# Patient Record
Sex: Male | Born: 1935 | Race: White | Hispanic: No | Smoking: Current every day smoker
Health system: Southern US, Community
[De-identification: ages and names within clinical notes are randomized; demographics above are authoritative.]

## PROBLEM LIST (undated history)

## (undated) DIAGNOSIS — S2239XA Fracture of one rib, unspecified side, initial encounter for closed fracture: Secondary | ICD-10-CM

## (undated) DIAGNOSIS — I10 Essential (primary) hypertension: Secondary | ICD-10-CM

## (undated) DIAGNOSIS — I447 Left bundle-branch block, unspecified: Secondary | ICD-10-CM

## (undated) DIAGNOSIS — I739 Peripheral vascular disease, unspecified: Secondary | ICD-10-CM

## (undated) DIAGNOSIS — S2249XA Multiple fractures of ribs, unspecified side, initial encounter for closed fracture: Secondary | ICD-10-CM

## (undated) DIAGNOSIS — M1990 Unspecified osteoarthritis, unspecified site: Secondary | ICD-10-CM

## (undated) DIAGNOSIS — Z5189 Encounter for other specified aftercare: Secondary | ICD-10-CM

## (undated) DIAGNOSIS — E785 Hyperlipidemia, unspecified: Secondary | ICD-10-CM

## (undated) DIAGNOSIS — J939 Pneumothorax, unspecified: Secondary | ICD-10-CM

## (undated) DIAGNOSIS — C801 Malignant (primary) neoplasm, unspecified: Secondary | ICD-10-CM

## (undated) DIAGNOSIS — D649 Anemia, unspecified: Secondary | ICD-10-CM

## (undated) DIAGNOSIS — K219 Gastro-esophageal reflux disease without esophagitis: Secondary | ICD-10-CM

## (undated) DIAGNOSIS — T148XXA Other injury of unspecified body region, initial encounter: Secondary | ICD-10-CM

## (undated) HISTORY — PX: APPENDECTOMY: SHX54

## (undated) HISTORY — PX: KNEE ARTHROSCOPY: SUR90

## (undated) HISTORY — PX: HERNIA REPAIR: SHX51

## (undated) HISTORY — PX: EYE SURGERY: SHX253

## (undated) HISTORY — PX: CHOLECYSTECTOMY: SHX55

## (undated) HISTORY — PX: HEMORRHOID SURGERY: SHX153

## (undated) HISTORY — PX: OTHER SURGICAL HISTORY: SHX169

---

## 2006-03-27 ENCOUNTER — Ambulatory Visit (HOSPITAL_COMMUNITY): Admission: RE | Admit: 2006-03-27 | Discharge: 2006-03-27 | Payer: Self-pay | Admitting: Internal Medicine

## 2007-02-18 ENCOUNTER — Ambulatory Visit: Payer: Self-pay | Admitting: Internal Medicine

## 2007-03-05 ENCOUNTER — Ambulatory Visit: Payer: Self-pay | Admitting: Internal Medicine

## 2008-02-10 ENCOUNTER — Ambulatory Visit: Payer: Self-pay | Admitting: Internal Medicine

## 2008-02-24 ENCOUNTER — Ambulatory Visit: Payer: Self-pay | Admitting: Internal Medicine

## 2008-02-24 ENCOUNTER — Encounter: Payer: Self-pay | Admitting: Internal Medicine

## 2008-04-26 DIAGNOSIS — K5730 Diverticulosis of large intestine without perforation or abscess without bleeding: Secondary | ICD-10-CM | POA: Insufficient documentation

## 2008-04-26 DIAGNOSIS — K219 Gastro-esophageal reflux disease without esophagitis: Secondary | ICD-10-CM | POA: Insufficient documentation

## 2008-04-26 DIAGNOSIS — M1990 Unspecified osteoarthritis, unspecified site: Secondary | ICD-10-CM | POA: Insufficient documentation

## 2008-04-26 DIAGNOSIS — E785 Hyperlipidemia, unspecified: Secondary | ICD-10-CM

## 2008-04-26 DIAGNOSIS — K319 Disease of stomach and duodenum, unspecified: Secondary | ICD-10-CM | POA: Insufficient documentation

## 2008-04-26 DIAGNOSIS — E119 Type 2 diabetes mellitus without complications: Secondary | ICD-10-CM

## 2008-04-28 ENCOUNTER — Ambulatory Visit: Payer: Self-pay | Admitting: Internal Medicine

## 2009-04-05 ENCOUNTER — Encounter: Admission: RE | Admit: 2009-04-05 | Discharge: 2009-04-05 | Payer: Self-pay | Admitting: Internal Medicine

## 2009-08-02 ENCOUNTER — Ambulatory Visit (HOSPITAL_COMMUNITY): Admission: RE | Admit: 2009-08-02 | Discharge: 2009-08-02 | Payer: Self-pay | Admitting: Internal Medicine

## 2009-08-08 ENCOUNTER — Ambulatory Visit: Payer: Self-pay | Admitting: Surgery

## 2010-03-08 ENCOUNTER — Encounter: Payer: Self-pay | Admitting: Internal Medicine

## 2010-03-17 ENCOUNTER — Encounter: Payer: Self-pay | Admitting: Internal Medicine

## 2010-03-24 ENCOUNTER — Ambulatory Visit: Payer: Self-pay | Admitting: Internal Medicine

## 2010-03-24 ENCOUNTER — Encounter: Payer: Self-pay | Admitting: Internal Medicine

## 2010-03-24 DIAGNOSIS — R55 Syncope and collapse: Secondary | ICD-10-CM

## 2010-03-27 ENCOUNTER — Ambulatory Visit: Payer: Self-pay | Admitting: Surgery

## 2010-04-13 ENCOUNTER — Telehealth (INDEPENDENT_AMBULATORY_CARE_PROVIDER_SITE_OTHER): Payer: Self-pay

## 2010-04-17 ENCOUNTER — Ambulatory Visit: Payer: Self-pay | Admitting: Cardiovascular Disease

## 2010-04-17 ENCOUNTER — Ambulatory Visit: Payer: Self-pay | Admitting: Internal Medicine

## 2010-04-17 ENCOUNTER — Encounter: Payer: Self-pay | Admitting: Cardiovascular Disease

## 2010-04-17 ENCOUNTER — Encounter (HOSPITAL_COMMUNITY): Admission: RE | Admit: 2010-04-17 | Discharge: 2010-06-06 | Payer: Self-pay | Admitting: Internal Medicine

## 2010-04-17 ENCOUNTER — Ambulatory Visit (HOSPITAL_COMMUNITY): Admission: RE | Admit: 2010-04-17 | Discharge: 2010-04-17 | Payer: Self-pay | Admitting: Internal Medicine

## 2010-04-17 ENCOUNTER — Encounter: Payer: Self-pay | Admitting: Internal Medicine

## 2010-04-17 ENCOUNTER — Ambulatory Visit: Payer: Self-pay

## 2010-12-05 NOTE — Progress Notes (Signed)
Summary: Nuc. Pre-Procedure  Phone Note Outgoing Call   Call placed by: Irean Hong, RN,  April 13, 2010 10:42 AM Summary of Call: Reviewed information on Myoview Information Sheet (see scanned document for further details).  Spoke with patient.     Nuclear Med Background Indications for Stress Test: Evaluation for Ischemia, Abnormal EKG   History: COPD   Symptoms: Syncope    Nuclear Pre-Procedure Cardiac Risk Factors: Carotid Disease, LBBB, NIDDM, PVD Height (in): 73

## 2010-12-05 NOTE — Letter (Signed)
Summary: [facility] Annual Physical Note  [facility] Annual Physical Note   Imported By: Roderic Ovens 03/30/2010 15:37:43  _____________________________________________________________________  External Attachment:    Type:   Image     Comment:   External Document

## 2010-12-05 NOTE — Assessment & Plan Note (Signed)
Summary: syncope/jr  Medications Added CRESTOR 20 MG TABS (ROSUVASTATIN CALCIUM) take 1/2 tablet at bedtime LISINOPRIL-HYDROCHLOROTHIAZIDE 20-12.5 MG TABS (LISINOPRIL-HYDROCHLOROTHIAZIDE) take one tablet once daily LOVAZA 1 GM CAPS (OMEGA-3-ACID ETHYL ESTERS) take one capsule once daily GLUCOTROL 5 MG TABS (GLIPIZIDE) two times a day METFORMIN HCL 1000 MG TABS (METFORMIN HCL) take one tablet two times a day VOLTAREN 0.1 % SOLN (DICLOFENAC SODIUM) use as needed      Allergies Added: NKDA  Primary Provider:  Martha Clan, MD  CC:  syncope/abnormal EKG.  Pt states that he has not been getting much sleep.  He slept 2 hours last night and thinks that might have a little to do with his problem.  History of Present Illness: Patient is a 77 year old who was referred for evaluation of syncope. Early this morning he woke up a  1 AM.  Went back to bed.  At 2 AM had coffee.  Went back to bed.  Dozed.  Awake at 4AM  At 5 AM sat up in bed.  Blacked out.  Hit table.   He denies dizziness, no chest pain.  No shortness of breath. he has not been resting well since an accident about 7 wks ago when he fell down a flight of stairs and punctured lung. He was seen by Dr. Clelia Croft earlier today and was referred her for evaluation. He is set up for a carotid USN as he has known CV disease.  Current Medications (verified): 1)  Baby Aspirin 81 Mg  Chew (Aspirin) .... Once Daily 2)  Glucosamine-Chondroitin 250-200 Mg  Caps (Glucosamine-Chondroitin) .... Once Daily 3)  Crestor 20 Mg Tabs (Rosuvastatin Calcium) .... Take 1/2 Tablet At Bedtime 4)  Tricor 145 Mg  Tabs (Fenofibrate) .... Once Daily 5)  Lisinopril-Hydrochlorothiazide 20-12.5 Mg Tabs (Lisinopril-Hydrochlorothiazide) .... Take One Tablet Once Daily 6)  Lovaza 1 Gm Caps (Omega-3-Acid Ethyl Esters) .... Take One Capsule Once Daily 7)  Glucotrol 5 Mg Tabs (Glipizide) .... Two Times A Day 8)  Metformin Hcl 1000 Mg Tabs (Metformin Hcl) .... Take One Tablet  Two Times A Day 9)  Voltaren 0.1 % Soln (Diclofenac Sodium) .... Use As Needed  Allergies (verified): No Known Drug Allergies  Past History:  Past Surgical History: Last updated: 04/26/2008 cholecystectomy hernia repair hemorrhoidectomy appendectomy  Family History: Last updated: 04/28/2008 No FH of Colon Cancer: Family History of Ovarian Cancer:Sister Family History of Liver Disease/Cirrhosis:Father  Past Medical History: Current Problems:  DIVERTICULOSIS, COLON (ICD-562.10) PEPTIC STRICTURE (ICD-537.89)status post dilation GERD (ICD-530.81) OSTEOARTHRITIS (ICD-715.90) HYPERLIPIDEMIA (ICD-272.4) DIABETES MELLITUS, TYPE II (ICD-250.00) CV disease. LBBB. PVOD  ABI 0.73 R; 0.61 L (08/2009) COPD.  Social History: Occupation: Retired Patient currently smokes. cigars  Quit cigs in 1991 after 80 pyr. Alcohol Use - no Daily Caffeine Use- Coffee3-4 cups Illicit Drug Use - no Patient gets regular exercise.  Review of Systems       All systems reviewed.  Negative to above probllem except as noted above.  Vital Signs:  Patient profile:   77 year old male Height:      73 inches Weight:      230 pounds BMI:     30.45 Pulse rate:   73 / minute Pulse (ortho):   88 / minute Pulse rhythm:   regular BP sitting:   154 / 64  (left arm) BP standing:   137 / 68 Cuff size:   large  Vitals Entered By: Burnett Kanaris, CNA (Mar 24, 2010 10:37 AM)  Serial Vital Signs/Assessments:  Time      Position  BP       Pulse  Resp  Temp     By 10:47 AM  Lying LA  136/68   70                    Burnett Kanaris, CNA 10:47 AM  Sitting   132/66   75                    Burnett Kanaris, CNA 10:47 AM  Standing  137/68   88                    Burnett Kanaris, CNA  Comments: 10:47 AM 3 minutes- 145/67 HR 75  Pt tired and in pain.  Pt states he did not get much sleep last night.   By: Burnett Kanaris, CNA    Physical Exam  Additional Exam:  Pateint is in NAD. HEENT:  Normocephalic,  atraumatic. EOMI, PERRLA.  Neck: JVP is normal. No thyromegaly. Positive bruit L. Lungs: Crackles at R base.  Otherwise CTA Heart: Regular rate and rhythm. Normal S1, S2. No S3.   No significant murmurs. PMI not displaced.  Abdomen:  Supple, nontender. Normal bowel sounds. No masses. No hepatomegaly.  Extremities:  Tr edema. Musculoskeletal :moving all extremities.  Neuro:   alert and oriented x 3.    EKG  Procedure date:  03/24/2010  Findings:      Sinus rhythm.  74 bpm. First degree AV block.  LBBB.  Impression & Recommendations:  Problem # 1:  SYNCOPE (ICD-780.2) Patient with known vascluar disease.  Event is concerning because of lack of prodrome. I would recommend:  echo, adenosine myoview, holter monitor.   He is due to have carotid dopplers. I have instructed him that he should not drive for 3 to 6 months unless  a reversible cause is found.  Problem # 2:  HYPERLIPIDEMIA (ICD-272.4) Continue meds. His updated medication list for this problem includes:    Crestor 20 Mg Tabs (Rosuvastatin calcium) .Marland Kitchen... Take 1/2 tablet at bedtime    Tricor 145 Mg Tabs (Fenofibrate) ..... Once daily    Lovaza 1 Gm Caps (Omega-3-acid ethyl esters) .Marland Kitchen... Take one capsule once daily  Problem # 3:  DIABETES MELLITUS, TYPE II (ICD-250.00) Followed by Dr. Clelia Croft.  Other Orders: T-2 View CXR (71020TC) Echocardiogram (Echo) Nuclear Stress Test (Nuc Stress Test) Holter (Holter)  Patient Instructions: 1)  Your physician has requested that you have an adenosine myoview.  For further information please visit [email or web address]  Please follow instruction sheet, as given. 2)  Your physician has requested that you have an echocardiogram.  Echocardiography is a painless test that uses sound waves to create images of your heart. It provides your doctor with information about the size and shape of your heart and how well your heart's chambers and valves are working.  This procedure takes approximately  one hour. There are no restrictions for this procedure. 3)  Your physician has recommended that you wear a holter monitor.  Holter monitors are medical devices that record the heart's electrical activity. Doctors most often use these monitors to diagnose arrhythmias. Arrhythmias are problems with the speed or rhythm of the heartbeat. The monitor is a small, portable device. You can wear one while you do your normal daily activities. This is usually used to diagnose what is causing palpitations/syncope (passing out).

## 2010-12-05 NOTE — Assessment & Plan Note (Signed)
Summary: Cardiology Nuclear Study  Nuclear Med Background Indications for Stress Test: Evaluation for Ischemia, Abnormal EKG   History: COPD   Symptoms: DOE, Palpitations, SOB, Syncope    Nuclear Pre-Procedure Cardiac Risk Factors: Carotid Disease, LBBB, NIDDM, PVD Caffeine/Decaff Intake: None NPO After: 11:00 PM Lungs: clear IV 0.9% NS with Angio Cath: 22g     IV Site: (R) Forearm IV Started by: Stanton Kidney EMT-P Chest Size (in) 48     Height (in): 73 Weight (lb): 229 BMI: 30.32  Nuclear Med Study 1 or 2 day study:  1 day     Stress Test Type:  Adenosine Reading MD:  Charlton Haws, MD     Referring MD:  P.Ross Resting Radionuclide:  Technetium 19m Tetrofosmin     Resting Radionuclide Dose:  10 mCi  Stress Radionuclide:  Technetium 68m Tetrofosmin     Stress Radionuclide Dose:  33 mCi   Stress Protocol  Dose of Adenosine:  58.3 mg    Stress Test Technologist:  Milana Na EMT-P     Nuclear Technologist:  Domenic Polite CNMT  Rest Procedure  Myocardial perfusion imaging was performed at rest 45 minutes following the intravenous administration of Myoview Technetium 43m Tetrofosmin.  Stress Procedure  The patient received IV adenosine at 140 mcg/kg/min for 4 minutes. There were no significant changes and occ pvcs/pac with infusion. Myoview was injected at the 2 minute mark and quantitative spect images were obtained after a 45 minute delay.  QPS Raw Data Images:  Normal; no motion artifact; normal heart/lung ratio. Stress Images:  NI: Uniform and normal uptake of tracer in all myocardial segments. Rest Images:  Normal homogeneous uptake in all areas of the myocardium. Subtraction (SDS):  Normal Transient Ischemic Dilatation:  .94  (Normal <1.22)  Lung/Heart Ratio:  .11  (Normal <0.45)  Quantitative Gated Spect Images QGS EDV:  153 ml QGS ESV:  64 ml QGS EF:  58 % QGS cine images:  Abnromal septal motion  Findings Low risk nuclear study      Overall  Impression  Exercise Capacity: Lexiscan BP Response: Normal blood pressure response. Clinical Symptoms: Light headed ECG Impression: LBBB Overall Impression: Septal thinnng consistant with BBB  no ischemia  Appended Document: Cardiology Nuclear Study No ischemia on myoview to explain syncope.  Normal myoview scan.  Appended Document: Cardiology Nuclear Study pt aware of results

## 2010-12-05 NOTE — Procedures (Signed)
Summary: Summary Report  Summary Report   Imported By: Erle Crocker 05/04/2010 11:15:20  _____________________________________________________________________  External Attachment:    Type:   Image     Comment:   External Document

## 2011-03-20 NOTE — Procedures (Signed)
CAROTID DUPLEX EXAM   INDICATION:  Follow up carotid artery disease.   HISTORY:  Diabetes:  Yes.  Cardiac:  Yes.  Hypertension:  Yes.  Smoking:  Cigars.  Previous Surgery:  No.  CV History:  Episode of syncope recently.  Asymptomatic now.  Amaurosis Fugax No, Paresthesias No, Hemiparesis No.                                       RIGHT               LEFT  Brachial systolic pressure:         128                 136  Brachial Doppler waveforms:         WNL                 WNL  Vertebral direction of flow:        Antegrade           Antegrade  DUPLEX VELOCITIES (cm/sec)  CCA peak systolic                   90                  125  ECA peak systolic                   221                 214  ICA peak systolic                   184                 141  ICA end diastolic                   44                  35  PLAQUE MORPHOLOGY:                  Calcific            Calcific  PLAQUE AMOUNT:                      Moderate            Moderate  PLAQUE LOCATION:                    ICA/ECA/bifurcation  ICA/ECA/bifurcation   IMPRESSION:  1. Right internal carotid artery shows evidence of 40% to 59% stenosis      (high end of range).  2. Left internal carotid artery shows evidence of 40% to 59% stenosis.  3. Bilateral external carotid artery stenosis.  4. No significant changes from previous study done on 08/08/2009.   ___________________________________________  V. Charlena Cross, MD   AS/MEDQ  D:  03/27/2010  T:  03/27/2010  Job:  [id number]

## 2011-03-20 NOTE — Assessment & Plan Note (Signed)
[facility]                         GASTROENTEROLOGY OFFICE NOTE   Shane Pope, Shane Pope                         MRN:          [id number]  DATE:02/10/2008                            DOB:          [birth year]    REFERRING PHYSICIAN:  Kari Baars, M.D.   REASON FOR CONSULTATION:  Dysphagia.   PHYSICIAN REQUESTING CONSULTATION:  Dr. Kari Baars   HISTORY:  This is a 77 year old white male with a history of  hyperlipidemia, type 2 diabetes, and osteoarthritis.  He is referred  through the courtesy of Dr. Clelia Croft regarding dysphagia.  The patient  reports to me intermittent solid food dysphagia to items such as rice.  This has occasionally been severe, requiring regurgitation for relief.  He has had 3 significant episodes since December; the most recent  episode 2 weeks ago.  No problems with liquids.  The patient does report  a several-year history of indigestion and heartburn.  No melena,  hematochezia, change in appetite or weight loss.  No abdominal pain.  Bowel habits have been regular.  The patient denies a prior history of  GI problems or GI evaluations.  He uses Pepcid p.r.n. for his heartburn.   PAST MEDICAL HISTORY:  1. Type 2 diabetes.  2. Hyperlipidemia.  3. Osteoarthritis.   PAST SURGICAL HISTORY:  1. Cholecystectomy.  2. Hernia repair.  3. Hemorrhoidectomy.  4. Appendectomy.   ALLERGIES:  NO KNOWN DRUG ALLERGIES.   CURRENT MEDICATIONS:  Crestor, Tricor, Janumet, benazepril, fish oil,  glucosamine, baby aspirin, Celebrex, Pepcid p.r.n.   FAMILY HISTORY:  Father with cirrhosis.  No gastrointestinal malignancy.   SOCIAL HISTORY:  The patient is divorced, with 2 daughters.  Lives  alone.  Has a college degree.  Worked previously in the 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  Smokes cigars.  Does not use  alcohol.   REVIEW OF SYSTEMS:  Entirely negative as documented on the diagnostic  evaluation form.   PHYSICAL  EXAMINATION:  A well-appearing male in no acute distress.  He  is alert and oriented x3.  Blood pressure is 152/66, heart rate is 76 and regular, respirations are  18.  He is 234.8. pounds.  He is 6 feet in height.  HEENT:  Sclerae are anicteric.  Conjunctivae are pink.  Oral mucosa is  intact.  There is no adenopathy.  LUNGS:  Clear.  HEART:  Regular.  ABDOMEN:  Soft without tenderness, mass, or hernia.  Good bowel sounds  heard.  EXTREMITIES:  Without edema.   IMPRESSION:  1. Gastroesophageal reflux disease.  2. Intermittent solid food dysphagia likely due to peptic stricture.  3. Colon cancer screening, baseline risk.   RECOMMENDATIONS:  1. Upper endoscopy with possible esophageal dilation.  The nature of      the procedure, as well as the risks, benefits and alternatives,      have been reviewed.  He understood and agreed to proceed.  2. Consider daily proton pump inhibitor therapy.  3. Information on reflux and reflux precautions.  4. Consider screening colonoscopy after upper GI issues resolve.  5. Ongoing general  medical care with Dr. Clelia Croft.     Wilhemina Bonito. Marina Goodell, MD  Electronically Signed    JNP/MedQ  DD: 02/10/2008  DT: 02/10/2008  Job #: [id number]   cc:   Kari Baars, M.D.

## 2011-03-20 NOTE — Procedures (Signed)
CAROTID DUPLEX EXAM   INDICATION:  Follow up carotid artery disease/PVD.   HISTORY:  Diabetes:  Yes.  Cardiac:  Yes.  Hypertension:  Yes.  Smoking:  Cigars.  Previous Surgery:  No.  CV History:  Asymptomatic.  Amaurosis Fugax No, Paresthesias No, Hemiparesis No.                                       RIGHT               LEFT  Brachial systolic pressure:         136                 145  Brachial Doppler waveforms:         WNL                 WNL  Vertebral direction of flow:        Antegrade           Antegrade  DUPLEX VELOCITIES (cm/sec)  CCA peak systolic                   95                  98  ECA peak systolic                   279                 203  ICA peak systolic                   192                 146  ICA end diastolic                   56                  36  PLAQUE MORPHOLOGY:                  Calcific            Calcific  PLAQUE AMOUNT:                      Mild/moderate       Mild/moderate  PLAQUE LOCATION:                    Bifurcation/ICA/ECA  Bifurcation/ICA/ECA   IMPRESSION:  1. Bilateral internal carotid arteries show evidence of 40-59%      stenosis with the right at the top-end of range.  2. Bilateral external carotid artery stenosis.   ___________________________________________  Janetta Hora Fields, MD   AS/MEDQ  D:  08/08/2009  T:  08/09/2009  Job:  [id number]

## 2011-12-25 ENCOUNTER — Other Ambulatory Visit: Payer: Self-pay | Admitting: Orthopedic Surgery

## 2012-03-03 NOTE — H&P (Signed)
Shane Pope DOB: 11-Jul-1931  Chief Complaint: right knee pain  History of Present Illness The patient is a 78 year old male who comes in today for a preoperative History and Physical. The patient is scheduled for a right total knee arthroplasty to be performed by Dr. Gus Rankin. [name], MD at [facility] on Monday Mar 24, 2012 . The patienthas been treated in the past by Dr. Thomasena Edis and Dr. Darrelyn Hillock for bilateral knee pain. The patient reports left knee and right knee symptoms including pain and grinding which began over 10 years ago without any known injury.The patient feels that the symptoms are worse in the right knee. [name] states the knee is getting progressively worse over time. The pain is tolerable but his dysfunction is getting worse and worse. He is having a hard time getting around. The right knee is worse than the left. He is unable to do things he desires. He is very concerned about the knee giving out on him as it has given out a few times. He has had cortisone and visco supplement injections which helped him a very minimal amount of time. Due to failure of conservative measures, the most predictable means for decreased pain and increased function in the right knee is a right total knee arthroplasty. Risks and benefits of the surgery discussed. PCP: Dr. Clelia Croft   Past MedicalHistory Fx closed rib NOS (807.00).  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 Osteoarthrosis NOS, lower leg (715.96).  Osteoarthrosis, local, primary, lower leg (715.16).  Hypertension Diabetes Mellitus, Type II Hypercholesterolemia Gastroesophageal Reflux Disease COPD. very early Fracture Of Rib Fracture Of Wrist  Allergies No Known Drug Allergies.    Social History Marital status. divorced Pain Contract. no Tobacco use. Smokes cigars. quit smoking cigarettes several years ago Current work status. retired Exercise. Exercises rarely Alcohol use. Never consumed  alcohol. Post-Surgical Plans. SNF: [facility]. living will, healthcare POA Living situation. Lives alone. Children. 2   Medication History Atorvastatin Calcium (40MG  Tablet, Oral) Active. Fenofibrate (160MG  Tablet, Oral daily) Active. GlipiZIDE (10MG  Tablet, 1/2 tablet Oral two times daily) Active. Lisinopril-Hydrochlorothiazide (20-25MG  Tablet, Oral daily) Active. Lovaza (1GM Capsule, Oral two times daily) Active. MetFORMIN HCl (1000MG  Tablet, Oral two times daily) Active. Victoza (18MG /3ML Solution, Subcutaneous daily) Active. (has not been taking for the last month) Aspirin EC (81MG  Tablet DR, Oral daily) Active. Omeprazole (40MG  Capsule DR, Oral daily) Active.   Past Surgical History Hemorrhoidectomy Cholecystectomy Tonsillectomy Appendectomy Arthroscopic Knee Surgery - Right    Review of Systems General:Not Present- Chills, Fever, Night Sweats, Fatigue, Weight Gain, Weight Loss and Memory Loss. Skin:Not Present- Hives, Itching, Rash, Eczema and Lesions. HEENT:Not Present- Tinnitus, Headache, Double Vision, Visual Loss, Hearing Loss and Dentures. Respiratory:Not Present- Shortness of breath with exertion, Shortness of breath at rest, Allergies, Coughing up blood and Chronic Cough. Cardiovascular:Not Present- Chest Pain, Racing/skipping heartbeats, Difficulty Breathing Lying Down, Murmur, Swelling and Palpitations. Gastrointestinal:Not Present- Bloody Stool, Heartburn, Abdominal Pain, Vomiting, Nausea, Constipation, Diarrhea, Difficulty Swallowing, Jaundice and Loss of appetitie. Male Genitourinary:Not Present- Urinary frequency, Blood in Urine, Weak urinary stream, Discharge, Flank Pain, Incontinence, Painful Urination, Urgency, Urinary Retention and Urinating at Night. Musculoskeletal:Present- Joint Swelling, Joint Pain and Morning Stiffness. Not Present- Muscle Weakness, Muscle Pain, Back Pain and Spasms. Neurological:Not Present-  Tremor, Dizziness, Blackout spells, Paralysis, Difficulty with balance and Weakness. Psychiatric:Not Present- Insomnia.   Vitals Weight: 216 lb Height: 72.5 in Body Surface Area: 2.24 m Body Mass Index: 28.89 kg/m Pulse: 68 (Regular) Resp.: 18 (Unlabored)  BP: 148/75 (Sitting, Left Arm, Standard)    Physical Exam General Mental Status - Alert, cooperative and good historian. General Appearance- pleasant. Not in acute distress. Orientation- Oriented X3. Build & Nutrition- Overweight, Well nourished and Well developed. Head and Neck Head- normocephalic, atraumatic . Neck Global Assessment- supple. no bruit auscultated on the right and no bruit auscultated on the left. Eye Pupil- Bilateral- Normal. Motion- Bilateral- EOMI. Chest and Lung Exam Auscultation: Adventitious sounds:Expiratory wheeze- Right Lung Field. Note: Lungs clear to auscultation in the left lung field Cardiovascular Auscultation:Rhythm- Regular rate and rhythm. Heart Sounds- S1 WNL and S2 WNL. Murmurs & Other Heart Sounds:Auscultation of the heart reveals - No Murmurs. Abdomen Palpation/Percussion:Tenderness- Abdomen is non-tender to palpation. Rigidity (guarding)- Abdomen is soft. Auscultation:Auscultation of the abdomen reveals - Bowel sounds normal. Male Genitourinary Not done, not pertinent to present illness Peripheral Vascular Upper Extremity: Palpation:- Pulses bilaterally normal. Lower Extremity: Palpation:- Pulses bilaterally normal. Neurologic Examination of related systems reveals - normal muscle strength and tone in all extremities. Neurologic evaluation reveals - normal sensation and upper and lower extremity deep tendon reflexes intact bilaterally . Musculoskeletal Both hips show a normal range of motion with no discomfort. The hips show a normal range of motion with no discomfort. The left knee with no effusion, range is 5-125 with a slight  varus and moderate crepitus on range of motion. Tender medial greater than lateral. No instability. The right knee has about a 10 degree varus deformity. Range is 10-120 with marked crepitus on range of motion. Slightly tender medial greater than lateral. No instability is noted.   RADIOGRAPHS: AP of both knees and lateral show he has bone on bone arthritis of the medial and patellofemoral compartments of the right knee with varus deformity and some bony erosion of the proximal medial tibia. The left knee has significant narrowing but not bone on bone.  Assessment & Plan Osteoarthritis, knee Right total knee arthroplasty    Dimitri Ped, PA-C

## 2012-03-07 ENCOUNTER — Encounter (HOSPITAL_COMMUNITY): Payer: Self-pay | Admitting: Pharmacy Technician

## 2012-03-13 ENCOUNTER — Encounter (HOSPITAL_COMMUNITY)
Admission: RE | Admit: 2012-03-13 | Discharge: 2012-03-13 | Disposition: A | Discharge status: Home / Self Care | Payer: Medicare Other | Source: Ambulatory Visit | Attending: Orthopedic Surgery | Admitting: Orthopedic Surgery

## 2012-03-13 ENCOUNTER — Ambulatory Visit (HOSPITAL_COMMUNITY)
Admission: RE | Admit: 2012-03-13 | Discharge: 2012-03-13 | Disposition: A | Discharge status: Home / Self Care | Payer: Medicare Other | Source: Ambulatory Visit | Attending: Orthopedic Surgery | Admitting: Orthopedic Surgery

## 2012-03-13 ENCOUNTER — Encounter (HOSPITAL_COMMUNITY): Payer: Self-pay

## 2012-03-13 DIAGNOSIS — J8410 Pulmonary fibrosis, unspecified: Secondary | ICD-10-CM | POA: Insufficient documentation

## 2012-03-13 DIAGNOSIS — I517 Cardiomegaly: Secondary | ICD-10-CM | POA: Insufficient documentation

## 2012-03-13 DIAGNOSIS — Z01812 Encounter for preprocedural laboratory examination: Secondary | ICD-10-CM | POA: Insufficient documentation

## 2012-03-13 DIAGNOSIS — I10 Essential (primary) hypertension: Secondary | ICD-10-CM | POA: Insufficient documentation

## 2012-03-13 DIAGNOSIS — Z01818 Encounter for other preprocedural examination: Secondary | ICD-10-CM | POA: Insufficient documentation

## 2012-03-13 DIAGNOSIS — F17200 Nicotine dependence, unspecified, uncomplicated: Secondary | ICD-10-CM | POA: Insufficient documentation

## 2012-03-13 DIAGNOSIS — E119 Type 2 diabetes mellitus without complications: Secondary | ICD-10-CM | POA: Insufficient documentation

## 2012-03-13 DIAGNOSIS — R911 Solitary pulmonary nodule: Secondary | ICD-10-CM | POA: Insufficient documentation

## 2012-03-13 HISTORY — DX: Essential (primary) hypertension: I10

## 2012-03-13 HISTORY — DX: Unspecified osteoarthritis, unspecified site: M19.90

## 2012-03-13 HISTORY — DX: Gastro-esophageal reflux disease without esophagitis: K21.9

## 2012-03-13 HISTORY — DX: Encounter for other specified aftercare: Z51.89

## 2012-03-13 HISTORY — DX: Multiple fractures of ribs, unspecified side, initial encounter for closed fracture: S22.49XA

## 2012-03-13 HISTORY — DX: Anemia, unspecified: D64.9

## 2012-03-13 HISTORY — DX: Malignant (primary) neoplasm, unspecified: C80.1

## 2012-03-13 HISTORY — DX: Fracture of one rib, unspecified side, initial encounter for closed fracture: S22.39XA

## 2012-03-13 HISTORY — DX: Other injury of unspecified body region, initial encounter: T14.8XXA

## 2012-03-13 LAB — URINALYSIS, ROUTINE W REFLEX MICROSCOPIC
Bilirubin Urine: NEGATIVE
Hgb urine dipstick: NEGATIVE
Nitrite: NEGATIVE
Protein, ur: NEGATIVE mg/dL
Specific Gravity, Urine: 1.017 (ref 1.005–1.030)
Urobilinogen, UA: 4 mg/dL — ABNORMAL HIGH (ref 0.0–1.0)

## 2012-03-13 LAB — PROTIME-INR
INR: 1.13 (ref 0.00–1.49)
Prothrombin Time: 14.7 seconds (ref 11.6–15.2)

## 2012-03-13 LAB — COMPREHENSIVE METABOLIC PANEL
Albumin: 3.5 g/dL (ref 3.5–5.2)
Alkaline Phosphatase: 59 U/L (ref 39–117)
BUN: 12 mg/dL (ref 6–23)
Creatinine, Ser: 1.12 mg/dL (ref 0.50–1.35)
GFR calc Af Amer: 71 mL/min — ABNORMAL LOW (ref >90)
Glucose, Bld: 209 mg/dL — ABNORMAL HIGH (ref 70–99)
Total Bilirubin: 0.7 mg/dL (ref 0.3–1.2)
Total Protein: 7.3 g/dL (ref 6.0–8.3)

## 2012-03-13 LAB — CBC
HCT: 40.4 % (ref 39.0–52.0)
Hemoglobin: 12.7 g/dL — ABNORMAL LOW (ref 13.0–17.0)
MCHC: 31.4 g/dL (ref 30.0–36.0)
MCV: 77.7 fL — ABNORMAL LOW (ref 78.0–100.0)
RDW: 19.3 % — ABNORMAL HIGH (ref 11.5–15.5)

## 2012-03-13 LAB — SURGICAL PCR SCREEN: Staphylococcus aureus: NEGATIVE

## 2012-03-13 NOTE — Pre-Procedure Instructions (Signed)
03/13/12 Faxed and confirmation received to Dr Lequita Halt to [phone]- [phone] CMET and U/A results to please note in EPIC.

## 2012-03-13 NOTE — Pre-Procedure Instructions (Signed)
03/13/12 Patient reports temporary bridge in place lower front.  Patient to have permanent bridge placed week of 03/16/12.  Pt on no antibiotics.  FYI regarding this information faxed to Dr Lequita Halt at [phone]. Confirmation received.

## 2012-03-13 NOTE — Patient Instructions (Signed)
20 Shane Pope  03/13/2012   Your procedure is scheduled on:  03/24/12 1115am-1220pm  Report to [facility] at 0845 AM.  Call this number if you have problems the morning of surgery: [phone]   Remember:   Do not eat food:After Midnight.  May have clear liquids:until Midnight .    Take these medicines the morning of surgery with A SIP OF WATER:    Do not wear jewelry,   Do not wear lotions, powders, or perfumes.     Do not bring valuables to the hospital.  Contacts, dentures or bridgework may not be worn into surgery.  Leave suitcase in the car. After surgery it may be brought to your room.  For patients admitted to the hospital, checkout time is 11:00 AM the day of discharge.    Special Instructions: CHG Shower Use Special Wash: 1/2 bottle night before surgery and 1/2 bottle morning of surgery. shower chin to toes with CHG.  Wash face and private parts with regular soap.    Please read over the following fact sheets that you were given: MRSA Information, Blood Transfusion Fact sheet, Incentive Spirometry Fact Sheet, coughing and deep breathing exercises, leg exercises

## 2012-03-13 NOTE — Pre-Procedure Instructions (Signed)
1/13 EKG on chart  04/17/10 ECHO in [facility]  04/17/10 STRESS TEST in EPIC  04/17/10 HOLTER MONITOR in [facility]

## 2012-03-13 NOTE — Pre-Procedure Instructions (Signed)
03/13/12 faxed note for Dr Lequita Halt to note CXR done 03/13/12 in EPIC .  Confirmation of fax received.

## 2012-03-17 ENCOUNTER — Ambulatory Visit
Admission: RE | Admit: 2012-03-17 | Discharge: 2012-03-17 | Disposition: A | Discharge status: Home / Self Care | Payer: Medicare Other | Source: Ambulatory Visit | Attending: Specialist | Admitting: Specialist

## 2012-03-17 ENCOUNTER — Other Ambulatory Visit: Payer: Self-pay | Admitting: Specialist

## 2012-03-17 DIAGNOSIS — M1710 Unilateral primary osteoarthritis, unspecified knee: Secondary | ICD-10-CM

## 2012-03-17 DIAGNOSIS — R911 Solitary pulmonary nodule: Secondary | ICD-10-CM

## 2012-03-17 MED ORDER — IOHEXOL 300 MG/ML  SOLN
75.0000 mL | Freq: Once | INTRAMUSCULAR | Status: AC | PRN
  Administered 2012-03-17: 75 mL via INTRAVENOUS

## 2012-03-21 NOTE — Pre-Procedure Instructions (Signed)
03/17/12 Chest CT done to follow up abnormal CXR.  In EPIC.

## 2012-03-24 ENCOUNTER — Ambulatory Visit (HOSPITAL_COMMUNITY): Payer: Medicare Other | Admitting: *Deleted

## 2012-03-24 ENCOUNTER — Encounter (HOSPITAL_COMMUNITY): Payer: Self-pay | Admitting: *Deleted

## 2012-03-24 ENCOUNTER — Encounter (HOSPITAL_COMMUNITY)
Admission: RE | Disposition: A | Discharge status: Skilled Nursing Facility | Payer: Self-pay | Source: Ambulatory Visit | Attending: Orthopedic Surgery

## 2012-03-24 ENCOUNTER — Encounter (HOSPITAL_COMMUNITY): Payer: Self-pay | Admitting: General Practice

## 2012-03-24 ENCOUNTER — Inpatient Hospital Stay (HOSPITAL_COMMUNITY)
Admission: RE | Admit: 2012-03-24 | Discharge: 2012-03-28 | DRG: 470 | Disposition: A | Discharge status: Skilled Nursing Facility | Payer: Medicare Other | Source: Ambulatory Visit | Attending: Orthopedic Surgery | Admitting: Orthopedic Surgery

## 2012-03-24 DIAGNOSIS — D62 Acute posthemorrhagic anemia: Secondary | ICD-10-CM | POA: Diagnosis not present

## 2012-03-24 DIAGNOSIS — M179 Osteoarthritis of knee, unspecified: Secondary | ICD-10-CM | POA: Diagnosis present

## 2012-03-24 DIAGNOSIS — J9819 Other pulmonary collapse: Secondary | ICD-10-CM | POA: Diagnosis not present

## 2012-03-24 DIAGNOSIS — R0902 Hypoxemia: Secondary | ICD-10-CM | POA: Diagnosis not present

## 2012-03-24 DIAGNOSIS — J988 Other specified respiratory disorders: Secondary | ICD-10-CM | POA: Diagnosis not present

## 2012-03-24 DIAGNOSIS — J449 Chronic obstructive pulmonary disease, unspecified: Secondary | ICD-10-CM | POA: Diagnosis present

## 2012-03-24 DIAGNOSIS — Z96659 Presence of unspecified artificial knee joint: Secondary | ICD-10-CM

## 2012-03-24 DIAGNOSIS — I10 Essential (primary) hypertension: Secondary | ICD-10-CM | POA: Diagnosis present

## 2012-03-24 DIAGNOSIS — E7800 Pure hypercholesterolemia, unspecified: Secondary | ICD-10-CM | POA: Diagnosis present

## 2012-03-24 DIAGNOSIS — Z9289 Personal history of other medical treatment: Secondary | ICD-10-CM

## 2012-03-24 DIAGNOSIS — Y831 Surgical operation with implant of artificial internal device as the cause of abnormal reaction of the patient, or of later complication, without mention of misadventure at the time of the procedure: Secondary | ICD-10-CM | POA: Diagnosis not present

## 2012-03-24 DIAGNOSIS — M1710 Unilateral primary osteoarthritis, unspecified knee: Principal | ICD-10-CM | POA: Diagnosis present

## 2012-03-24 DIAGNOSIS — J4489 Other specified chronic obstructive pulmonary disease: Secondary | ICD-10-CM | POA: Diagnosis present

## 2012-03-24 DIAGNOSIS — J9811 Atelectasis: Secondary | ICD-10-CM | POA: Diagnosis not present

## 2012-03-24 DIAGNOSIS — E119 Type 2 diabetes mellitus without complications: Secondary | ICD-10-CM | POA: Diagnosis present

## 2012-03-24 DIAGNOSIS — K219 Gastro-esophageal reflux disease without esophagitis: Secondary | ICD-10-CM | POA: Diagnosis present

## 2012-03-24 DIAGNOSIS — E871 Hypo-osmolality and hyponatremia: Secondary | ICD-10-CM | POA: Diagnosis not present

## 2012-03-24 HISTORY — PX: TOTAL KNEE ARTHROPLASTY: SHX125

## 2012-03-24 LAB — GLUCOSE, CAPILLARY
Glucose-Capillary: 181 mg/dL — ABNORMAL HIGH (ref 70–99)
Glucose-Capillary: 175 mg/dL — ABNORMAL HIGH (ref 70–99)
Glucose-Capillary: 239 mg/dL — ABNORMAL HIGH (ref 70–99)

## 2012-03-24 LAB — ABO/RH: ABO/RH(D): O POS

## 2012-03-24 SURGERY — ARTHROPLASTY, KNEE, TOTAL
Anesthesia: General | Site: Knee | Laterality: Right | Wound class: Clean

## 2012-03-24 MED ORDER — DIPHENHYDRAMINE HCL 50 MG/ML IJ SOLN: 12.5000 mg | Freq: Four times a day (QID) | INTRAMUSCULAR | Status: DC | PRN

## 2012-03-24 MED ORDER — FLEET ENEMA 7-19 GM/118ML RE ENEM: 1.0000 | ENEMA | Freq: Once | RECTAL | Status: AC | PRN

## 2012-03-24 MED ORDER — POLYETHYLENE GLYCOL 3350 17 G PO PACK: 17.0000 g | PACK | Freq: Every day | ORAL | Status: DC | PRN

## 2012-03-24 MED ORDER — CEFAZOLIN SODIUM-DEXTROSE 2-3 GM-% IV SOLR
2.0000 g | INTRAVENOUS | Status: AC
  Administered 2012-03-24: 2 g via INTRAVENOUS

## 2012-03-24 MED ORDER — SUCCINYLCHOLINE CHLORIDE 20 MG/ML IJ SOLN
INTRAMUSCULAR | Status: DC | PRN
  Administered 2012-03-24: 100 mg via INTRAVENOUS

## 2012-03-24 MED ORDER — BUPIVACAINE ON-Q PAIN PUMP (FOR ORDER SET NO CHG)
INJECTION | Status: DC
  Filled 2012-03-24: qty 1

## 2012-03-24 MED ORDER — PHENOL 1.4 % MT LIQD
1.0000 | OROMUCOSAL | Status: DC | PRN
  Filled 2012-03-24: qty 177

## 2012-03-24 MED ORDER — BUPIVACAINE 0.25 % ON-Q PUMP SINGLE CATH 300ML
300.0000 mL | INJECTION | Status: DC
  Filled 2012-03-24: qty 300

## 2012-03-24 MED ORDER — SODIUM CHLORIDE 0.9 % IV SOLN
INTRAVENOUS | Status: DC
  Administered 2012-03-24 (×2): via INTRAVENOUS
  Administered 2012-03-25: 20 mL/h via INTRAVENOUS

## 2012-03-24 MED ORDER — MORPHINE SULFATE (PF) 1 MG/ML IV SOLN
INTRAVENOUS | Status: DC
  Administered 2012-03-24: 1 mg via INTRAVENOUS
  Administered 2012-03-24: 13:00:00 via INTRAVENOUS

## 2012-03-24 MED ORDER — DEXAMETHASONE SODIUM PHOSPHATE 10 MG/ML IJ SOLN
INTRAMUSCULAR | Status: DC | PRN
  Administered 2012-03-24: 10 mg via INTRAVENOUS

## 2012-03-24 MED ORDER — INSULIN ASPART 100 UNIT/ML ~~LOC~~ SOLN
0.0000 [IU] | Freq: Three times a day (TID) | SUBCUTANEOUS | Status: DC
  Administered 2012-03-24: 5 [IU] via SUBCUTANEOUS
  Administered 2012-03-25 (×3): 3 [IU] via SUBCUTANEOUS
  Administered 2012-03-26: 2 [IU] via SUBCUTANEOUS
  Administered 2012-03-26: 3 [IU] via SUBCUTANEOUS
  Administered 2012-03-26: 2 [IU] via SUBCUTANEOUS
  Administered 2012-03-27: 3 [IU] via SUBCUTANEOUS
  Administered 2012-03-27 – 2012-03-28 (×3): 2 [IU] via SUBCUTANEOUS

## 2012-03-24 MED ORDER — CEFAZOLIN SODIUM 1-5 GM-% IV SOLN
1.0000 g | Freq: Four times a day (QID) | INTRAVENOUS | Status: AC
  Administered 2012-03-24 – 2012-03-25 (×3): 1 g via INTRAVENOUS
  Filled 2012-03-24 (×4): qty 50

## 2012-03-24 MED ORDER — ONDANSETRON HCL 4 MG PO TABS: 4.0000 mg | ORAL_TABLET | Freq: Four times a day (QID) | ORAL | Status: DC | PRN

## 2012-03-24 MED ORDER — MORPHINE SULFATE (PF) 1 MG/ML IV SOLN
INTRAVENOUS | Status: AC
  Filled 2012-03-24: qty 25

## 2012-03-24 MED ORDER — LACTATED RINGERS IV SOLN: INTRAVENOUS | Status: DC

## 2012-03-24 MED ORDER — ACETAMINOPHEN 650 MG RE SUPP: 650.0000 mg | Freq: Four times a day (QID) | RECTAL | Status: DC | PRN

## 2012-03-24 MED ORDER — DEXAMETHASONE SODIUM PHOSPHATE 10 MG/ML IJ SOLN
10.0000 mg | Freq: Once | INTRAMUSCULAR | Status: DC
  Filled 2012-03-24: qty 1

## 2012-03-24 MED ORDER — MIDAZOLAM HCL 5 MG/5ML IJ SOLN
INTRAMUSCULAR | Status: DC | PRN
  Administered 2012-03-24: 2 mg via INTRAVENOUS

## 2012-03-24 MED ORDER — DEXTROSE 5 % IV SOLN
500.0000 mg | Freq: Four times a day (QID) | INTRAVENOUS | Status: DC | PRN
  Filled 2012-03-24 (×2): qty 5

## 2012-03-24 MED ORDER — FENTANYL CITRATE 0.05 MG/ML IJ SOLN
INTRAMUSCULAR | Status: DC | PRN
  Administered 2012-03-24 (×2): 50 ug via INTRAVENOUS
  Administered 2012-03-24 (×2): 25 ug via INTRAVENOUS
  Administered 2012-03-24 (×2): 100 ug via INTRAVENOUS

## 2012-03-24 MED ORDER — ATORVASTATIN CALCIUM 40 MG PO TABS
40.0000 mg | ORAL_TABLET | Freq: Every day | ORAL | Status: DC
  Administered 2012-03-25 – 2012-03-28 (×4): 40 mg via ORAL
  Filled 2012-03-24 (×5): qty 1

## 2012-03-24 MED ORDER — HYDROMORPHONE HCL PF 1 MG/ML IJ SOLN: 0.2500 mg | INTRAMUSCULAR | Status: DC | PRN

## 2012-03-24 MED ORDER — PANTOPRAZOLE SODIUM 40 MG PO TBEC
80.0000 mg | DELAYED_RELEASE_TABLET | Freq: Every day | ORAL | Status: DC
  Filled 2012-03-24: qty 2

## 2012-03-24 MED ORDER — ACETAMINOPHEN 325 MG PO TABS: 650.0000 mg | ORAL_TABLET | Freq: Four times a day (QID) | ORAL | Status: DC | PRN

## 2012-03-24 MED ORDER — TEMAZEPAM 15 MG PO CAPS
15.0000 mg | ORAL_CAPSULE | Freq: Every evening | ORAL | Status: DC | PRN
  Administered 2012-03-27: 15 mg via ORAL
  Filled 2012-03-24: qty 1

## 2012-03-24 MED ORDER — DIPHENHYDRAMINE HCL 12.5 MG/5ML PO ELIX: 12.5000 mg | ORAL_SOLUTION | Freq: Four times a day (QID) | ORAL | Status: DC | PRN

## 2012-03-24 MED ORDER — METFORMIN HCL 500 MG PO TABS: 1000.0000 mg | ORAL_TABLET | Freq: Two times a day (BID) | ORAL | Status: DC

## 2012-03-24 MED ORDER — CEFAZOLIN SODIUM-DEXTROSE 2-3 GM-% IV SOLR
INTRAVENOUS | Status: AC
  Filled 2012-03-24: qty 50

## 2012-03-24 MED ORDER — CHLORHEXIDINE GLUCONATE 4 % EX LIQD
60.0000 mL | Freq: Once | CUTANEOUS | Status: DC
  Filled 2012-03-24: qty 60

## 2012-03-24 MED ORDER — MENTHOL 3 MG MT LOZG
1.0000 | LOZENGE | OROMUCOSAL | Status: DC | PRN
  Filled 2012-03-24: qty 9

## 2012-03-24 MED ORDER — ACETAMINOPHEN 10 MG/ML IV SOLN
INTRAVENOUS | Status: AC
  Filled 2012-03-24: qty 100

## 2012-03-24 MED ORDER — ONDANSETRON HCL 4 MG/2ML IJ SOLN
INTRAMUSCULAR | Status: DC | PRN
  Administered 2012-03-24: 4 mg via INTRAVENOUS

## 2012-03-24 MED ORDER — BUPIVACAINE 0.25 % ON-Q PUMP SINGLE CATH 300ML
INJECTION | Status: DC | PRN
  Administered 2012-03-24: 300 mL

## 2012-03-24 MED ORDER — METFORMIN HCL 500 MG PO TABS
1000.0000 mg | ORAL_TABLET | Freq: Two times a day (BID) | ORAL | Status: DC
  Filled 2012-03-24 (×3): qty 2

## 2012-03-24 MED ORDER — BUPIVACAINE 0.25 % ON-Q PUMP SINGLE CATH 300ML
INJECTION | Status: AC
  Filled 2012-03-24: qty 300

## 2012-03-24 MED ORDER — GLIPIZIDE 10 MG PO TABS
10.0000 mg | ORAL_TABLET | Freq: Two times a day (BID) | ORAL | Status: DC
  Administered 2012-03-24 – 2012-03-28 (×8): 10 mg via ORAL
  Filled 2012-03-24 (×10): qty 1

## 2012-03-24 MED ORDER — SODIUM CHLORIDE 0.9 % IR SOLN
Status: DC | PRN
  Administered 2012-03-24: 1000 mL

## 2012-03-24 MED ORDER — HYDROMORPHONE HCL PF 1 MG/ML IJ SOLN
INTRAMUSCULAR | Status: DC | PRN
  Administered 2012-03-24 (×2): 0.5 mg via INTRAVENOUS
  Administered 2012-03-24: 1 mg via INTRAVENOUS

## 2012-03-24 MED ORDER — ACETAMINOPHEN 10 MG/ML IV SOLN
1000.0000 mg | Freq: Once | INTRAVENOUS | Status: AC
  Administered 2012-03-24 (×2): 1000 mg via INTRAVENOUS
  Filled 2012-03-24: qty 100

## 2012-03-24 MED ORDER — ONDANSETRON HCL 4 MG/2ML IJ SOLN: 4.0000 mg | Freq: Four times a day (QID) | INTRAMUSCULAR | Status: DC | PRN

## 2012-03-24 MED ORDER — PROPOFOL 10 MG/ML IV EMUL
INTRAVENOUS | Status: DC | PRN
  Administered 2012-03-24: 150 mg via INTRAVENOUS
  Administered 2012-03-24: 50 mg via INTRAVENOUS

## 2012-03-24 MED ORDER — DIPHENHYDRAMINE HCL 12.5 MG/5ML PO ELIX: 12.5000 mg | ORAL_SOLUTION | ORAL | Status: DC | PRN

## 2012-03-24 MED ORDER — FENOFIBRATE 160 MG PO TABS
160.0000 mg | ORAL_TABLET | Freq: Every day | ORAL | Status: DC
  Administered 2012-03-25 – 2012-03-28 (×4): 160 mg via ORAL
  Filled 2012-03-24 (×5): qty 1

## 2012-03-24 MED ORDER — NALOXONE HCL 0.4 MG/ML IJ SOLN: 0.4000 mg | INTRAMUSCULAR | Status: DC | PRN

## 2012-03-24 MED ORDER — LIDOCAINE HCL (CARDIAC) 20 MG/ML IV SOLN
INTRAVENOUS | Status: DC | PRN
  Administered 2012-03-24: 100 mg via INTRAVENOUS

## 2012-03-24 MED ORDER — SODIUM CHLORIDE 0.9 % IJ SOLN: 9.0000 mL | INTRAMUSCULAR | Status: DC | PRN

## 2012-03-24 MED ORDER — SODIUM CHLORIDE 0.9 % IV SOLN
INTRAVENOUS | Status: DC
  Administered 2012-03-24: 15:00:00 via INTRAVENOUS

## 2012-03-24 MED ORDER — LACTATED RINGERS IV SOLN
INTRAVENOUS | Status: DC | PRN
  Administered 2012-03-24 (×2): via INTRAVENOUS

## 2012-03-24 MED ORDER — BISACODYL 10 MG RE SUPP: 10.0000 mg | Freq: Every day | RECTAL | Status: DC | PRN

## 2012-03-24 MED ORDER — METOCLOPRAMIDE HCL 10 MG PO TABS: 5.0000 mg | ORAL_TABLET | Freq: Three times a day (TID) | ORAL | Status: DC | PRN

## 2012-03-24 MED ORDER — RIVAROXABAN 10 MG PO TABS
10.0000 mg | ORAL_TABLET | Freq: Every day | ORAL | Status: DC
  Administered 2012-03-25 – 2012-03-28 (×4): 10 mg via ORAL
  Filled 2012-03-24 (×5): qty 1

## 2012-03-24 MED ORDER — METHOCARBAMOL 500 MG PO TABS
500.0000 mg | ORAL_TABLET | Freq: Four times a day (QID) | ORAL | Status: DC | PRN
  Administered 2012-03-25 – 2012-03-27 (×4): 500 mg via ORAL
  Filled 2012-03-24 (×4): qty 1

## 2012-03-24 MED ORDER — METOCLOPRAMIDE HCL 5 MG/ML IJ SOLN: 5.0000 mg | Freq: Three times a day (TID) | INTRAMUSCULAR | Status: DC | PRN

## 2012-03-24 MED ORDER — DOCUSATE SODIUM 100 MG PO CAPS
100.0000 mg | ORAL_CAPSULE | Freq: Two times a day (BID) | ORAL | Status: DC
  Administered 2012-03-24 – 2012-03-28 (×8): 100 mg via ORAL

## 2012-03-24 MED ORDER — OXYCODONE HCL 5 MG PO TABS
5.0000 mg | ORAL_TABLET | ORAL | Status: DC | PRN
  Administered 2012-03-25 (×4): 10 mg via ORAL
  Administered 2012-03-26: 5 mg via ORAL
  Administered 2012-03-26 (×2): 10 mg via ORAL
  Administered 2012-03-27: 5 mg via ORAL
  Administered 2012-03-27: 10 mg via ORAL
  Filled 2012-03-24 (×4): qty 2
  Filled 2012-03-24: qty 1
  Filled 2012-03-24 (×4): qty 2

## 2012-03-24 SURGICAL SUPPLY — 53 items
BAG SPEC THK2 15X12 ZIP CLS (MISCELLANEOUS) ×1
BAG ZIPLOCK 12X15 (MISCELLANEOUS) ×2 IMPLANT
BANDAGE ELASTIC 6 VELCRO ST LF (GAUZE/BANDAGES/DRESSINGS) ×2 IMPLANT
BANDAGE ESMARK 6X9 LF (GAUZE/BANDAGES/DRESSINGS) ×1 IMPLANT
BLADE SAG 18X100X1.27 (BLADE) ×2 IMPLANT
BLADE SAW SGTL 11.0X1.19X90.0M (BLADE) ×2 IMPLANT
BNDG CMPR 9X6 STRL LF SNTH (GAUZE/BANDAGES/DRESSINGS) ×1
BNDG ESMARK 6X9 LF (GAUZE/BANDAGES/DRESSINGS) ×2
BOWL SMART MIX CTS (DISPOSABLE) ×2 IMPLANT
CATH KIT ON-Q SILVERSOAK 5 (CATHETERS) ×1 IMPLANT
CATH KIT ON-Q SILVERSOAK 5IN (CATHETERS) ×2 IMPLANT
CEMENT HV SMART SET (Cement) ×4 IMPLANT
CLOTH BEACON ORANGE TIMEOUT ST (SAFETY) ×2 IMPLANT
CUFF TOURN SGL QUICK 34 (TOURNIQUET CUFF) ×2
CUFF TRNQT CYL 34X4X40X1 (TOURNIQUET CUFF) ×1 IMPLANT
DRAPE EXTREMITY T 121X128X90 (DRAPE) ×2 IMPLANT
DRAPE POUCH INSTRU U-SHP 10X18 (DRAPES) ×2 IMPLANT
DRAPE U-SHAPE 47X51 STRL (DRAPES) ×2 IMPLANT
DRSG ADAPTIC 3X8 NADH LF (GAUZE/BANDAGES/DRESSINGS) ×2 IMPLANT
DURAPREP 26ML APPLICATOR (WOUND CARE) ×2 IMPLANT
ELECT REM PT RETURN 9FT ADLT (ELECTROSURGICAL) ×2
ELECTRODE REM PT RTRN 9FT ADLT (ELECTROSURGICAL) ×1 IMPLANT
EVACUATOR 1/8 PVC DRAIN (DRAIN) ×2 IMPLANT
FACESHIELD LNG OPTICON STERILE (SAFETY) ×10 IMPLANT
GLOVE BIO SURGEON STRL SZ7.5 (GLOVE) ×2 IMPLANT
GLOVE BIO SURGEON STRL SZ8 (GLOVE) ×2 IMPLANT
GLOVE BIOGEL PI IND STRL 8 (GLOVE) ×2 IMPLANT
GLOVE BIOGEL PI INDICATOR 8 (GLOVE) ×2
GOWN STRL NON-REIN LRG LVL3 (GOWN DISPOSABLE) ×2 IMPLANT
GOWN STRL REIN XL XLG (GOWN DISPOSABLE) ×2 IMPLANT
HANDPIECE INTERPULSE COAX TIP (DISPOSABLE) ×2
IMMOBILIZER KNEE 20 (SOFTGOODS) ×2
IMMOBILIZER KNEE 20 THIGH 36 (SOFTGOODS) ×1 IMPLANT
KIT BASIN OR (CUSTOM PROCEDURE TRAY) ×2 IMPLANT
MANIFOLD NEPTUNE II (INSTRUMENTS) ×2 IMPLANT
NS IRRIG 1000ML POUR BTL (IV SOLUTION) ×2 IMPLANT
PACK TOTAL JOINT (CUSTOM PROCEDURE TRAY) ×2 IMPLANT
PAD ABD 7.5X8 STRL (GAUZE/BANDAGES/DRESSINGS) ×2 IMPLANT
PADDING CAST COTTON 6X4 STRL (CAST SUPPLIES) ×6 IMPLANT
POSITIONER SURGICAL ARM (MISCELLANEOUS) ×2 IMPLANT
SET HNDPC FAN SPRY TIP SCT (DISPOSABLE) ×1 IMPLANT
SPONGE GAUZE 4X4 12PLY (GAUZE/BANDAGES/DRESSINGS) ×2 IMPLANT
STRIP CLOSURE SKIN 1/2X4 (GAUZE/BANDAGES/DRESSINGS) ×4 IMPLANT
SUCTION FRAZIER 12FR DISP (SUCTIONS) ×2 IMPLANT
SUT MNCRL AB 4-0 PS2 18 (SUTURE) ×2 IMPLANT
SUT PDS AB 1 CT1 27 (SUTURE) ×6 IMPLANT
SUT VIC AB 2-0 CT1 27 (SUTURE) ×6
SUT VIC AB 2-0 CT1 TAPERPNT 27 (SUTURE) ×3 IMPLANT
SUT VLOC 180 0 24IN GS25 (SUTURE) ×2 IMPLANT
TOWEL OR 17X26 10 PK STRL BLUE (TOWEL DISPOSABLE) ×4 IMPLANT
TRAY FOLEY CATH 14FRSI W/METER (CATHETERS) ×2 IMPLANT
WATER STERILE IRR 1500ML POUR (IV SOLUTION) ×2 IMPLANT
WRAP KNEE MAXI GEL POST OP (GAUZE/BANDAGES/DRESSINGS) ×4 IMPLANT

## 2012-03-24 NOTE — Preoperative (Signed)
Beta Blockers   Reason not to administer Beta Blockers:Not Applicable 

## 2012-03-24 NOTE — Plan of Care (Signed)
Problem: Consults Goal: Diagnosis- Total Joint Replacement Outcome: Progressing Right total knee replacement     

## 2012-03-24 NOTE — Anesthesia Preprocedure Evaluation (Signed)
Anesthesia Evaluation  Patient identified by MRN, date of birth, ID band Patient awake    Reviewed: Allergy & Precautions, H&P , NPO status , Patient's Chart, lab work & pertinent test results, reviewed documented beta blocker date and time   Airway Mallampati: I TM Distance: >3 FB Neck ROM: Full    Dental  (+) Dental Advisory Given and Teeth Intact   Pulmonary neg pulmonary ROS,  breath sounds clear to auscultation        Cardiovascular hypertension, Pt. on medications Rhythm:Regular  Denies cardiac symptoms Neg CV w/u a year ago   Neuro/Psych negative neurological ROS  negative psych ROS   GI/Hepatic negative GI ROS, Neg liver ROS,   Endo/Other  Diabetes mellitus-, Well Controlled, Type 2, Oral Hypoglycemic Agents  Renal/GU negative Renal ROS  negative genitourinary   Musculoskeletal   Abdominal   Peds negative pediatric ROS (+)  Hematology negative hematology ROS (+)   Anesthesia Other Findings Extensive lower front bridge  Reproductive/Obstetrics negative OB ROS                           Anesthesia Physical Anesthesia Plan  ASA: III  Anesthesia Plan: General   Post-op Pain Management:    Induction: Intravenous  Airway Management Planned: Oral ETT  Additional Equipment:   Intra-op Plan:   Post-operative Plan: Extubation in OR  Informed Consent: I have reviewed the patients History and Physical, chart, labs and discussed the procedure including the risks, benefits and alternatives for the proposed anesthesia with the patient or authorized representative who has indicated his/her understanding and acceptance.   Dental advisory given  Plan Discussed with: CRNA and Surgeon  Anesthesia Plan Comments: (Pt refuses regional)        Anesthesia Quick Evaluation

## 2012-03-24 NOTE — Interval H&P Note (Signed)
History and Physical Interval Note:  03/24/2012 10:54 AM  Shane Pope  has presented today for surgery, with the diagnosis of right knee osteoarthritis  The various methods of treatment have been discussed with the patient and family. After consideration of risks, benefits and other options for treatment, the patient has consented to  Procedure(s) (LRB): TOTAL KNEE ARTHROPLASTY (Right) as a surgical intervention .  The patients' history has been reviewed, patient examined, no change in status, stable for surgery.  I have reviewed the patients' chart and labs.  Questions were answered to the patient's satisfaction.     Loanne Drilling

## 2012-03-24 NOTE — Progress Notes (Signed)
Utilization review completed.  

## 2012-03-24 NOTE — Anesthesia Postprocedure Evaluation (Signed)
  Anesthesia Post-op Note  Patient: Shane Pope  Procedure(s) Performed: Procedure(s) (LRB): TOTAL KNEE ARTHROPLASTY (Right)  Patient Location: PACU  Anesthesia Type: General  Level of Consciousness: oriented and sedated  Airway and Oxygen Therapy: Patient Spontanous Breathing and Patient connected to nasal cannula oxygen  Post-op Pain: mild  Post-op Assessment: Post-op Vital signs reviewed, Patient's Cardiovascular Status Stable, Respiratory Function Stable and Patent Airway  Post-op Vital Signs: stable  Complications: No apparent anesthesia complications

## 2012-03-24 NOTE — Transfer of Care (Signed)
Immediate Anesthesia Transfer of Care Note  Patient: Shane Pope  Procedure(s) Performed: Procedure(s) (LRB): TOTAL KNEE ARTHROPLASTY (Right)  Patient Location: PACU  Anesthesia Type: General  Level of Consciousness: sedated  Airway & Oxygen Therapy: Patient Spontanous Breathing and Patient connected to face mask oxygen  Post-op Assessment: Report given to PACU RN and Post -op Vital signs reviewed and stable  Post vital signs: Reviewed and stable  Complications: No apparent anesthesia complications

## 2012-03-24 NOTE — Progress Notes (Signed)
Oral airway removed

## 2012-03-24 NOTE — Op Note (Signed)
Pre-operative diagnosis- Osteoarthritis  Right knee(s)  Post-operative diagnosis- Osteoarthritis Right knee(s)  Procedure-  Right  Total Knee Arthroplasty  Surgeon- Gus Rankin. Stefhanie Kachmar, MD  Assistant- Avel Peace, PA-C   Anesthesia-  General EBL-* No blood loss amount entered *  Drains Hemovac  Tourniquet time-  Total Tourniquet Time Documented: Thigh (Right) - 33 minutes   Complications- None  Condition-PACU - hemodynamically stable.   Brief Clinical Note  Shane Pope is a 78 y.o. year old male with end stage OA of his right knee with progressively worsening pain and dysfunction. He has constant pain, with activity and at rest and significant functional deficits with difficulties even with ADLs. He has had extensive non-op management including analgesics, injections of cortisone and viscosupplements, and home exercise program, but remains in significant pain with significant dysfunction. Radiographs show bone on bone changes medial and patellofemoral with tibial subchondral sclerosis. He presents now for right Total Knee Arthroplasty.    Procedure in detail---   The patient is brought into the operating room and positioned supine on the operating table. After successful administration of  General,   a tourniquet is placed high on the  Right thigh(s) and the lower extremity is prepped and draped in the usual sterile fashion. Time out is performed by the operating team and then the  Right lower extremity is wrapped in Esmarch, knee flexed and the tourniquet inflated to 300 mmHg.       A midline incision is made with a ten blade through the subcutaneous tissue to the level of the extensor mechanism. A fresh blade is used to make a medial parapatellar arthrotomy. Soft tissue over the proximal medial tibia is subperiosteally elevated to the joint line with a knife and into the semimembranosus bursa with a Cobb elevator. Soft tissue over the proximal lateral tibia is elevated with attention being  paid to avoiding the patellar tendon on the tibial tubercle. The patella is everted, knee flexed 90 degrees and the ACL and PCL are removed. Findings are bone on bone medial and patellofemoral with large medial osteophytes.        The drill is used to create a starting hole in the distal femur and the canal is thoroughly irrigated with sterile saline to remove the fatty contents. The 5 degree Right  valgus alignment guide is placed into the femoral canal and the distal femoral cutting block is pinned to remove 11 mm off the distal femur. Resection is made with an oscillating saw.      The tibia is subluxed forward and the menisci are removed. The extramedullary alignment guide is placed referencing proximally at the medial aspect of the tibial tubercle and distally along the second metatarsal axis and tibial crest. The block is pinned to remove 2mm off the more deficient medial  side. Resection is made with an oscillating saw. Size 5is the most appropriate size for the tibia and the proximal tibia is prepared with the modular drill and keel punch for that size.      The femoral sizing guide is placed and size 5 is most appropriate. Rotation is marked off the epicondylar axis and confirmed by creating a rectangular flexion gap at 90 degrees. The size 5 cutting block is pinned in this rotation and the anterior, posterior and chamfer cuts are made with the oscillating saw. The intercondylar block is then placed and that cut is made.      Trial size 5 tibial component, trial size 5 posterior stabilized femur and  a 12.5  mm posterior stabilized rotating platform insert trial is placed. Full extension is achieved with excellent varus/valgus and anterior/posterior balance throughout full range of motion. The patella is everted and thickness measured to be 27  mm. Free hand resection is taken to 15 mm, a 41 template is placed, lug holes are drilled, trial patella is placed, and it tracks normally. Osteophytes are  removed off the posterior femur with the trial in place. All trials are removed and the cut bone surfaces prepared with pulsatile lavage. Cement is mixed and once ready for implantation, the size 5 tibial implant, size  5 posterior stabilized femoral component, and the size 41 patella are cemented in place and the patella is held with the clamp. The trial insert is placed and the knee held in full extension. All extruded cement is removed and once the cement is hard the permanent 12.5 mm posterior stabilized rotating platform insert is placed into the tibial tray.      The wound is copiously irrigated with saline solution and the extensor mechanism closed over a hemovac drain with #1 PDS suture. The tourniquet is released for a total tourniquet time of 33  minutes. Flexion against gravity is 140 degrees and the patella tracks normally. Subcutaneous tissue is closed with 2.0 vicryl and subcuticular with running 4.0 Monocryl. The catheter for the Marcaine pain pump is placed and the pump is initiated. The incision is cleaned and dried and steri-strips and a bulky sterile dressing are applied. The limb is placed into a knee immobilizer and the patient is awakened and transported to recovery in stable condition.      Please note that a surgical assistant was a medical necessity for this procedure in order to perform it in a safe and expeditious manner. Surgical assistant was necessary to retract the ligaments and vital neurovascular structures to prevent injury to them and also necessary for proper positioning of the limb to allow for anatomic placement of the prosthesis.   Gus Rankin Jahaan Vanwagner, MD    03/24/2012, 12:10 PM

## 2012-03-25 ENCOUNTER — Encounter (HOSPITAL_COMMUNITY): Payer: Self-pay | Admitting: Orthopedic Surgery

## 2012-03-25 LAB — CBC
HCT: 28.9 % — ABNORMAL LOW (ref 39.0–52.0)
MCH: 24.7 pg — ABNORMAL LOW (ref 26.0–34.0)
MCV: 78.5 fL (ref 78.0–100.0)
Platelets: 166 10*3/uL (ref 150–400)
RBC: 3.68 MIL/uL — ABNORMAL LOW (ref 4.22–5.81)
RDW: 18.7 % — ABNORMAL HIGH (ref 11.5–15.5)

## 2012-03-25 LAB — BASIC METABOLIC PANEL
BUN: 14 mg/dL (ref 6–23)
CO2: 24 mEq/L (ref 19–32)
Calcium: 8.3 mg/dL — ABNORMAL LOW (ref 8.4–10.5)
Creatinine, Ser: 1.07 mg/dL (ref 0.50–1.35)

## 2012-03-25 LAB — GLUCOSE, CAPILLARY: Glucose-Capillary: 165 mg/dL — ABNORMAL HIGH (ref 70–99)

## 2012-03-25 MED ORDER — MORPHINE SULFATE 2 MG/ML IJ SOLN: 1.0000 mg | INTRAMUSCULAR | Status: DC | PRN

## 2012-03-25 MED ORDER — NON FORMULARY: 40.0000 mg | Freq: Every day | Status: DC

## 2012-03-25 MED ORDER — OMEPRAZOLE 20 MG PO CPDR
40.0000 mg | DELAYED_RELEASE_CAPSULE | Freq: Every day | ORAL | Status: DC
  Administered 2012-03-25 – 2012-03-28 (×4): 40 mg via ORAL
  Filled 2012-03-25 (×4): qty 2

## 2012-03-25 MED ORDER — FERROUS SULFATE 325 (65 FE) MG PO TABS
325.0000 mg | ORAL_TABLET | Freq: Two times a day (BID) | ORAL | Status: DC
  Administered 2012-03-25 – 2012-03-28 (×7): 325 mg via ORAL
  Filled 2012-03-25 (×9): qty 1

## 2012-03-25 NOTE — Progress Notes (Signed)
Clinical Social Work Department CLINICAL SOCIAL WORK PLACEMENT NOTE 03/25/2012  Patient:  Shane Pope, Shane Pope  Account Number:  [id number] Admit date:  03/24/2012  Clinical Social Worker:  Cori Razor, LCSW  Date/time:  03/25/2012 11:14 AM  Clinical Social Work is seeking post-discharge placement for this patient at the following level of care:   SKILLED NURSING   (*CSW will update this form in Epic as items are completed)     Patient/family provided with [facility] Department of Clinical Social Work's list of facilities offering this level of care within the geographic area requested by the patient (or if unable, by the patient's family).    Patient/family informed of their freedom to choose among providers that offer the needed level of care, that participate in Medicare, Medicaid or managed care program needed by the patient, have an available bed and are willing to accept the patient.    Patient/family informed of [facility]' ownership interest in [facility], as well as of the fact that they are under no obligation to receive care at this facility.  PASARR submitted to EDS on 03/25/2012 PASARR number received from EDS on 03/25/2012  FL2 transmitted to all facilities in geographic area requested by pt/family on  03/25/2012 FL2 transmitted to all facilities within larger geographic area on   Patient informed that his/her managed care company has contracts with or will negotiate with  certain facilities, including the following:     Patient/family informed of bed offers received:  03/25/2012 Patient chooses bed at [facility] Physician recommends and patient chooses bed at    Patient to be transferred to  on   Patient to be transferred to facility by   The following physician request were entered in Epic:   Additional Comments:  Cori Razor  LCSW  [phone]

## 2012-03-25 NOTE — Progress Notes (Signed)
Subjective: 1 Day Post-Op Procedure(s) (LRB): TOTAL KNEE ARTHROPLASTY (Right) Patient reports pain as mild and moderate.   Patient seen in rounds with Dr. Lequita Halt. Patient did not get any sleep.  Had a rough night. Patient is well, but has had some minor complaints of pain in the knee, requiring pain medications We will start therapy today.  Plan is to go Skilled nursing facility after hospital stay - [facility].  Objective: Vital signs in last 24 hours: Temp:  [96.7 F (35.9 C)-98.3 F (36.8 C)] 97.8 F (36.6 C) (05/21 0527) Pulse Rate:  [70-82] 70  (05/21 0527) Resp:  [9-24] 16  (05/21 0527) BP: (114-171)/(55-79) 134/61 mmHg (05/21 0527) SpO2:  [90 %-96 %] 96 % (05/21 0527) Weight:  [92.987 kg (205 lb)] 92.987 kg (205 lb) (05/20 1531)  Intake/Output from previous day:  Intake/Output Summary (Last 24 hours) at 03/25/12 0715 Last data filed at 03/25/12 0527  Gross per 24 hour  Intake   3690 ml  Output   1865 ml  Net   1825 ml    Intake/Output this shift:    Labs:  Basename 03/25/12 0428  HGB 9.1*    Basename 03/25/12 0428  WBC 7.6  RBC 3.68*  HCT 28.9*  PLT 166    Basename 03/25/12 0428  NA 136  K 4.3  CL 103  CO2 24  BUN 14  CREATININE 1.07  GLUCOSE 199*  CALCIUM 8.3*   No results found for this basename: LABPT:2,INR:2 in the last 72 hours  EXAM General - Patient is Alert, Appropriate and Oriented Extremity - Neurovascular intact Sensation intact distally Dressing - dressing C/D/I Motor Function - intact, moving foot and toes well on exam.  Hemovac pulled without difficulty.  Past Medical History  Diagnosis Date  . Hypertension   . Diabetes mellitus   . Anemia   . Blood transfusion     hx of   . GERD (gastroesophageal reflux disease)   . Arthritis   . 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 Fracture     hx of right wrist fracture   . Fracture, ribs     hx of on right side     Assessment/Plan: 1 Day Post-Op Procedure(s)  (LRB): TOTAL KNEE ARTHROPLASTY (Right) Principal Problem:  *OA (osteoarthritis) of knee   Advance diet Up with therapy Continue foley due to strict I&O and urinary output monitoring Discharge to SNF  DVT Prophylaxis - Xarelto Weight-Bearing as tolerated to right leg Keep foley until tomorrow. No vaccines. D/C PCA, Change to IV push D/C O2 and Pulse OX and try on Room Air  Ruairi Stutsman, Marlowe Sax 03/25/2012, 7:15 AM

## 2012-03-25 NOTE — Evaluation (Signed)
Physical Therapy Evaluation Patient Details Name: Shane Pope MRN: [id number] DOB: Oct 01, 1931 Today's Date: 03/25/2012 Time:  -     PT Assessment / Plan / Recommendation Clinical Impression  Pt is S/p RTKA whe was able to ambulate on eval. pt plans SNF. pt will benefit from PT to improve in ROM, strength and functional mobility to DC to SNF    PT Assessment  Patient needs continued PT services    Follow Up Recommendations  Skilled nursing facility    Barriers to Discharge Decreased caregiver support      lEquipment Recommendations  Defer to next venue    Recommendations for Other Services OT consult   Frequency 7X/week    Precautions / Restrictions Precautions Precautions: Knee Required Braces or Orthoses: Knee Immobilizer - Right Knee Immobilizer - Right: Discontinue once straight leg raise with < 10 degree lag Restrictions Weight Bearing Restrictions: No   Pertinent Vitals/Pain 8/10. Had pain meds. Pt w/ noted cough and wheezing. sats at 88 on RA. RN stated OK to place 2 l. O2. ssats back to 94. Encouraged IS      Mobility  Bed Mobility Bed Mobility: Supine to Sit Supine to Sit: 2: Max assist Details for Bed Mobility Assistance: VC to move LE's to edge and trunk opposite. some support to sit up onto side of bed Transfers Transfers: Sit to Stand;Stand to Sit Sit to Stand: 1: +2 Total assist;With upper extremity assist;From elevated surface Sit to Stand: Patient Percentage: 60% Stand to Sit: 1: +2 Total assist;With upper extremity assist Stand to Sit: Patient Percentage: 60% Details for Transfer Assistance: VC/TC for UE placement, support of RLE prior to sitting down, decreased control of descent. Ambulation/Gait Ambulation/Gait Assistance: 1: +2 Total assist Ambulation Distance (Feet): 50 Feet Assistive device: Rolling walker Gait Pattern: Decreased stance time - right;Step-to pattern;Trunk flexed;Antalgic General Gait Details: VC fro sequence and step length  and prevention for pt to place RW too far forward.    Exercises     PT Diagnosis: Difficulty walking;Acute pain  PT Problem List: Decreased strength;Decreased range of motion;Decreased activity tolerance;Decreased mobility;Decreased knowledge of use of DME;Pain PT Treatment Interventions: DME instruction;Gait training;Functional mobility training;Therapeutic activities;Patient/family education   PT Goals Acute Rehab PT Goals PT Goal Formulation: With patient Time For Goal Achievement: 04/01/12 Potential to Achieve Goals: Good Pt will go Supine/Side to Sit: with supervision;with HOB not 0 degrees (comment degree) PT Goal: Supine/Side to Sit - Progress: Goal set today Pt will go Sit to Supine/Side: with supervision PT Goal: Sit to Supine/Side - Progress: Goal set today Pt will go Sit to Stand: with supervision PT Goal: Sit to Stand - Progress: Goal set today Pt will go Stand to Sit: with supervision PT Goal: Stand to Sit - Progress: Goal set today Pt will Ambulate: 51 - 150 feet;with rolling walker;with min assist PT Goal: Ambulate - Progress: Goal set today  Visit Information  Last PT Received On: 03/25/12    Subjective Data  Subjective: I am really hurting. I have not slept a wink. Patient Stated Goal: to go to [place].   Prior Functioning  Home Living Lives With: Alone Available Help at Discharge: Friend(s) Type of Home: House Home Layout: One level Home Adaptive Equipment: Straight cane;Walker - rolling;Crutches Prior Function Level of Independence: Independent Vocation: Retired Musician: No difficulties    Cognition  Overall Cognitive Status: Appears within functional limits for tasks assessed/performed Arousal/Alertness: Awake/alert Orientation Level: Appears intact for tasks assessed Behavior During Session: [facility] for tasks performed  Extremity/Trunk Assessment Right Upper Extremity Assessment RUE ROM/Strength/Tone: [facility] for tasks  assessed Left Upper Extremity Assessment LUE ROM/Strength/Tone: WFL for tasks assessed Right Lower Extremity Assessment RLE ROM/Strength/Tone: Deficits RLE ROM/Strength/Tone Deficits: pt requires assistance to move LE to edge of bed.  RLE Sensation: WFL - Light Touch Left Lower Extremity Assessment LLE ROM/Strength/Tone: WFL for tasks assessed   Balance    End of Session PT - End of Session Equipment Utilized During Treatment: Right knee immobilizer Activity Tolerance: Patient limited by fatigue;Patient limited by pain Patient left: in chair;with call bell/phone within reach Nurse Communication: Mobility status (+2 for safety)   Rada Hay 03/25/2012, 3:15 PM  [phone]

## 2012-03-25 NOTE — Progress Notes (Signed)
Physical Therapy Treatment Patient Details Name: Shane Pope MRN: [id number] DOB: 26-Mar-1931 Today's Date: 03/25/2012 Time: 4098-1191 PT Time Calculation (min): 27 min  PT Assessment / Plan / Recommendation Comments on Treatment Session  P tolerated a short walk. continues to have decreased sats on RA. pt began to had the shivers after return to bed.     Follow Up Recommendations  Skilled nursing facility    Barriers to Discharge Decreased caregiver support      Equipment Recommendations  Defer to next venue    Recommendations for Other Services OT consult  Frequency 7X/week   Plan Discharge plan remains appropriate;Frequency remains appropriate    Precautions / Restrictions Precautions Precautions: Knee Required Braces or Orthoses: Knee Immobilizer - Right Knee Immobilizer - Right: Discontinue once straight leg raise with < 10 degree lag Restrictions Weight Bearing Restrictions: No   Pertinent Vitals/Pain 8/10 R knee w/ activity. Ice applied. Has had meds. Decreased to 6 w/ rest.    Mobility  Bed Mobility Bed Mobility: Sit to Supine Supine to Sit: 2: Max assist Sit to Supine: 1: +2 Total assist;HOB flat Sit to Supine: Patient Percentage: 50% Details for Bed Mobility Assistance: lifting assistance for RLe and support of trunk to lie down. Transfers Transfers: Sit to Stand;Stand to Sit Sit to Stand: 1: +2 Total assist;From chair/3-in-1;With upper extremity assist Sit to Stand: Patient Percentage: 60% Stand to Sit: To bed;1: +2 Total assist Stand to Sit: Patient Percentage: 60% Details for Transfer Assistance: VC to push from recliner/reach back to bed, assist to spport RLE as pt sat doen to bed Ambulation/Gait Ambulation/Gait Assistance: 1: +2 Total assist Ambulation/Gait: Patient Percentage: 70% Ambulation Distance (Feet): 10 Feet Assistive device: Rolling walker Ambulation/Gait Assistance Details: VC for posture and sequence.  VC to not push RW too far  forward./ Gait Pattern: Step-to pattern;Decreased stance time - right General Gait Details: VC fro sequence and step length and prevention for pt to place RW too far forward.    Exercises Total Joint Exercises Quad Sets: AAROM;Right;10 reps;Supine Hip ABduction/ADduction: AAROM;Right;5 reps;Supine Straight Leg Raises: AAROM;Right;10 reps;Supine   PT Diagnosis: Difficulty walking;Acute pain  PT Problem List: Decreased strength;Decreased range of motion;Decreased activity tolerance;Decreased mobility;Decreased knowledge of use of DME;Pain PT Treatment Interventions: DME instruction;Gait training;Functional mobility training;Therapeutic activities;Patient/family education   PT Goals Acute Rehab PT Goals PT Goal Formulation: With patient Time For Goal Achievement: 04/01/12 Potential to Achieve Goals: Good Pt will go Supine/Side to Sit: with supervision;with HOB not 0 degrees (comment degree) PT Goal: Supine/Side to Sit - Progress: Goal set today Pt will go Sit to Supine/Side: with supervision PT Goal: Sit to Supine/Side - Progress: Progressing toward goal Pt will go Sit to Stand: with supervision PT Goal: Sit to Stand - Progress: Progressing toward goal Pt will go Stand to Sit: with supervision PT Goal: Stand to Sit - Progress: Progressing toward goal Pt will Ambulate: 51 - 150 feet;with min assist;with rolling walker PT Goal: Ambulate - Progress: Progressing toward goal  Visit Information  Last PT Received On: 03/25/12 Assistance Needed: +2    Subjective Data  Subjective: i have got the shakes. Patient Stated Goal: to go to [place].   Cognition  Overall Cognitive Status: Appears within functional limits for tasks assessed/performed Arousal/Alertness: Awake/alert Orientation Level: Appears intact for tasks assessed Behavior During Session: [facility] for tasks performed    Balance     End of Session PT - End of Session Equipment Utilized During Treatment: Right knee  immobilizer Activity Tolerance: Patient  tolerated treatment well Patient left: in bed;with call bell/phone within reach Nurse Communication: Mobility status    Rada Hay 03/25/2012, 3:54 PM

## 2012-03-25 NOTE — Care Management Note (Signed)
    Page 1 of 2   03/25/2012     2:50:36 PM   CARE MANAGEMENT NOTE 03/25/2012  Patient:  Shane Pope, Shane Pope   Account Number:  [id number]  Date Initiated:  03/25/2012  Documentation initiated by:  Colleen Can  Subjective/Objective Assessment:   dx rt knee osteoarthritis: total knee replacemnt     Action/Plan:   CM spoke with patient. Plans are for SNF rehab   Anticipated DC Date:  03/27/2012   Anticipated DC Plan:  SKILLED NURSING FACILITY  In-house referral  Clinical Social Worker      DC Planning Services  CM consult      [facility] Choice  NA   Choice offered to / List presented to:  NA   DME arranged  NA      DME agency  NA     HH arranged  NA      HH agency  NA   Status of service:  Completed, signed off Medicare Important Message given?  NA - LOS <3 / Initial given by admissions (If response is "NO", the following Medicare IM given date fields will be blank) Date Medicare IM given:   Date Additional Medicare IM given:    Discharge Disposition:    Per UR Regulation:    If discussed at Long Length of Stay Meetings, dates discussed:    Comments:

## 2012-03-25 NOTE — Progress Notes (Signed)
Clinical Social Work Department BRIEF PSYCHOSOCIAL ASSESSMENT 03/25/2012  Patient:  Shane Pope, Shane Pope     Account Number:  [id number]     Admit date:  03/24/2012  Clinical Social Worker:  Candie Chroman  Date/Time:  03/25/2012 11:01 AM  Referred by:  Physician  Date Referred:  03/25/2012 Referred for  SNF Placement   Other Referral:   Interview type:  Patient Other interview type:    PSYCHOSOCIAL DATA Living Status:  ALONE Admitted from facility:   Level of care:   Primary support name:  Shane Pope Primary support relationship to patient:  FRIEND Degree of support available:   unclear    CURRENT CONCERNS Current Concerns  Post-Acute Placement   Other Concerns:    SOCIAL WORK ASSESSMENT / PLAN Pt is a 78 yr old gentleman living home, alone, prior to hospitalization. Met with pt to assist with d/c planning. Pt has made arrangement s to have ST SNF placement at [facility] upon d/c. SNF has confirmed d/c plan pending insurance approval [facility] ). CSW will follow to assist with d/c planning to SNF.   Assessment/plan status:  Psychosocial Support/Ongoing Assessment of Needs Other assessment/ plan:   Information/referral to community resources:    PATIENT'S/FAMILY'S RESPONSE TO PLAN OF CARE: Pt is planning to have rehab at [facility].    Cori Razor  lCSW  [phone]

## 2012-03-26 DIAGNOSIS — D62 Acute posthemorrhagic anemia: Secondary | ICD-10-CM | POA: Diagnosis not present

## 2012-03-26 LAB — BASIC METABOLIC PANEL
BUN: 14 mg/dL (ref 6–23)
CO2: 23 mEq/L (ref 19–32)
Chloride: 103 mEq/L (ref 96–112)
GFR calc non Af Amer: 66 mL/min — ABNORMAL LOW (ref >90)
Glucose, Bld: 157 mg/dL — ABNORMAL HIGH (ref 70–99)
Potassium: 4.2 mEq/L (ref 3.5–5.1)
Sodium: 134 mEq/L — ABNORMAL LOW (ref 135–145)

## 2012-03-26 LAB — CBC
HCT: 26.2 % — ABNORMAL LOW (ref 39.0–52.0)
Hemoglobin: 8.3 g/dL — ABNORMAL LOW (ref 13.0–17.0)
MCHC: 31.7 g/dL (ref 30.0–36.0)
RBC: 3.38 MIL/uL — ABNORMAL LOW (ref 4.22–5.81)

## 2012-03-26 NOTE — Progress Notes (Signed)
Physical Therapy Treatment Patient Details Name: Shane Pope MRN: [id number] DOB: 1931-05-04 Today's Date: 03/26/2012 Time: 0865-7846 PT Time Calculation (min): 40 min  PT Assessment / Plan / Recommendation Comments on Treatment Session  Pt progressing slowly and plans to D/C to [facility] for ST Rehab    Follow Up Recommendations  Skilled nursing facility    Barriers to Discharge        Equipment Recommendations  Defer to next venue    Recommendations for Other Services    Frequency 7X/week   Plan Discharge plan remains appropriate    Precautions / Restrictions Precautions Precautions: Knee Precaution Comments: Pt instructed on KI use for AMB Required Braces or Orthoses: Knee Immobilizer - Right Knee Immobilizer - Right: Discontinue once straight leg raise with < 10 degree lag Restrictions Weight Bearing Restrictions: No Other Position/Activity Restrictions: Pt aware he is WBAT   Pertinent Vitals/Pain C/o 8/10 Knee pain, premed, ICE,repositioned    Mobility  Bed Mobility Details for Bed Mobility Assistance: Pt OOB by OT in recliner Transfers Transfers: Sit to Stand;Stand to Sit Sit to Stand: 1: +2 Total assist;With upper extremity assist;From chair/3-in-1 Sit to Stand: Patient Percentage: 60% Stand to Sit: 1: +2 Total assist;With upper extremity assist;To chair/3-in-1 Stand to Sit: Patient Percentage: 50% Details for Transfer Assistance: 50% VC's on safety with turns and advancement of R LE prior to sit Ambulation/Gait Ambulation/Gait Assistance: 1: +2 Total assist Ambulation/Gait: Patient Percentage: 70% Ambulation Distance (Feet): 18 Feet Assistive device: Rolling walker Ambulation/Gait Assistance Details: 25% VC's on proper sequencing and proper walker to self distance Gait Pattern: Step-to pattern;Decreased stance time - right;Narrow base of support;Decreased stride length Stairs: No Wheelchair Mobility Wheelchair Mobility: No    Exercises Total Joint  Exercises Ankle Circles/Pumps: AROM;Both;10 reps;Supine Quad Sets: AROM;Both;10 reps;Supine Gluteal Sets: AROM;Both;10 reps;Supine Towel Squeeze: AROM;Both;10 reps;Supine Heel Slides: AAROM;Right;10 reps;Supine Hip ABduction/ADduction: AAROM;Right;10 reps;Supine Straight Leg Raises: AAROM;Right;10 reps;Supine   PT Diagnosis:    PT Problem List:   PT Treatment Interventions:     PT Goals Acute Rehab PT Goals PT Goal Formulation: With patient Potential to Achieve Goals: Good Pt will go Sit to Stand: with supervision PT Goal: Sit to Stand - Progress: Progressing toward goal Pt will go Stand to Sit: with supervision PT Goal: Stand to Sit - Progress: Progressing toward goal Pt will Ambulate: 51 - 150 feet;with min assist;with rolling walker PT Goal: Ambulate - Progress: Progressing toward goal  Visit Information  Last PT Received On: 03/26/12 Assistance Needed: +2    Subjective Data  Subjective: I'm tiered Patient Stated Goal: to go to [employer]    fair (meds)   Balance   poor  End of Session PT - End of Session Equipment Utilized During Treatment: Gait belt;Right knee immobilizer Activity Tolerance: Patient limited by fatigue;Patient limited by pain Patient left: in chair;with call bell/phone within reach;Other (comment) (ICE applied to R knee)    Felecia Shelling  PTA [facility]  Acute  Rehab Pager     [phone]

## 2012-03-26 NOTE — Progress Notes (Signed)
Subjective: 2 Days Post-Op Procedure(s) (LRB): TOTAL KNEE ARTHROPLASTY (Right) Patient reports pain as moderate.   Patient seen in rounds with Dr. Lequita Halt. Patient is well, but has had some minor complaints of pain in the knee, requiring pain medications Plan is to go Skilled nursing facility after hospital stay - [facility]  Objective: Vital signs in last 24 hours: Temp:  [98.2 F (36.8 C)-98.5 F (36.9 C)] 98.5 F (36.9 C) (05/22 0629) Pulse Rate:  [71-104] 80  (05/22 0629) Resp:  [16-20] 16  (05/22 0629) BP: (137-160)/(61-66) 160/66 mmHg (05/22 0629) SpO2:  [88 %-94 %] 94 % (05/22 0629)  Intake/Output from previous day:  Intake/Output Summary (Last 24 hours) at 03/26/12 1204 Last data filed at 03/26/12 1035  Gross per 24 hour  Intake 609.97 ml  Output   1475 ml  Net -865.03 ml    Intake/Output this shift: Total I/O In: -  Out: 400 [Urine:400]  Labs:  [facility] 03/26/12 0515 03/25/12 0428  HGB 8.3* 9.1*    Basename 03/26/12 0515 03/25/12 0428  WBC 5.7 7.6  RBC 3.38* 3.68*  HCT 26.2* 28.9*  PLT 146* 166    Basename 03/26/12 0515 03/25/12 0428  NA 134* 136  K 4.2 4.3  CL 103 103  CO2 23 24  BUN 14 14  CREATININE 1.05 1.07  GLUCOSE 157* 199*  CALCIUM 8.2* 8.3*   No results found for this basename: LABPT:2,INR:2 in the last 72 hours  EXAM General - Patient is Alert, Appropriate and Oriented Extremity - Neurovascular intact Sensation intact distally Dressing/Incision - clean, dry, no drainage, healing Motor Function - intact, moving foot and toes well on exam.   Past Medical History  Diagnosis Date  . Hypertension   . Diabetes mellitus   . Anemia   . Blood transfusion     hx of   . GERD (gastroesophageal reflux disease)   . Arthritis   . 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 Fracture     hx of right wrist fracture   . Fracture, ribs     hx of on right side     Assessment/Plan: 2 Days Post-Op Procedure(s) (LRB): TOTAL KNEE  ARTHROPLASTY (Right) Principal Problem:  *OA (osteoarthritis) of knee Active Problems:  Postop Acute blood loss anemia   Advance diet Up with therapy Plan for discharge tomorrow Discharge to SNF  DVT Prophylaxis - Xarelto Weight-Bearing as tolerated to right leg  Kyler Germer 03/26/2012, 12:04 PM

## 2012-03-26 NOTE — Evaluation (Signed)
Occupational Therapy Evaluation Patient Details Name: Shane Pope MRN: [id number] DOB: [birth year] Today's Date: 03/26/2012 Time: 4098-1191 OT Time Calculation (min): 30 min  OT Assessment / Plan / Recommendation Clinical Impression  Pt is S/p RTKA and displays increased pain, decreased strength and activity tolerance and will benefit from skilled OT services to increase safety and independence wtih self care tasks for next venue of care.     OT Assessment  Patient needs continued OT Services    Follow Up Recommendations  Skilled nursing facility    Barriers to Discharge      Equipment Recommendations  Defer to next venue    Recommendations for Other Services    Frequency  Min 1X/week    Precautions / Restrictions Precautions Precautions: Knee Required Braces or Orthoses: Knee Immobilizer - Right Knee Immobilizer - Right: Discontinue once straight leg raise with < 10 degree lag Restrictions Weight Bearing Restrictions: No        ADL  Eating/Feeding: Simulated;Independent Where Assessed - Eating/Feeding: Chair Grooming: Simulated;Wash/dry hands;Set up Where Assessed - Grooming: Supported sitting Upper Body Bathing: Simulated;Chest;Right arm;Left arm;Abdomen;Set up;Supervision/safety Where Assessed - Upper Body Bathing: Unsupported sitting Lower Body Bathing: +2 Total assistance;Simulated Lower Body Bathing: Patient Percentage: 30% Where Assessed - Lower Body Bathing: Supported sit to stand Upper Body Dressing: Simulated;Minimal assistance Where Assessed - Upper Body Dressing: Unsupported sitting Lower Body Dressing: +2 Total assistance Lower Body Dressing: Patient Percentage: 10% Where Assessed - Lower Body Dressing: Sopported sit to stand Toilet Transfer: Simulated;+2 Total assistance Toilet Transfer: Patient Percentage: 50% (had to pull up chair behind him as he couldnt finish pivot) Toilet Transfer Method: Stand pivot Toileting - Clothing Manipulation and  Hygiene: Simulated;+2 Total assistance Toileting - Architect and Hygiene: Patient Percentage: 0% Where Assessed - Glass blower/designer Manipulation and Hygiene: Standing Tub/Shower Transfer Method: Not assessed Equipment Used: Gait belt;Rolling walker ADL Comments: pt difficult to understand at times as he tends to [employer] his responses but very pleasant. Limited by pain. Nsg in at end of eval and aware.     OT Diagnosis: Generalized weakness;Acute pain  OT Problem List: Decreased strength;Decreased activity tolerance;Decreased knowledge of use of DME or AE;Pain OT Treatment Interventions: Self-care/ADL training;Therapeutic activities;DME and/or AE instruction;Patient/family education   OT Goals Acute Rehab OT Goals OT Goal Formulation: With patient Time For Goal Achievement: 04/02/12 Potential to Achieve Goals: Good ADL Goals Pt Will Perform Grooming: with mod assist;Standing at sink ADL Goal: Grooming - Progress: Goal set today Pt Will Perform Lower Body Bathing: with mod assist;Sit to stand from chair;Sit to stand from bed ADL Goal: Lower Body Bathing - Progress: Goal set today Pt Will Perform Lower Body Dressing: with mod assist;Sit to stand from chair;Sit to stand from bed ADL Goal: Lower Body Dressing - Progress: Goal set today Pt Will Transfer to Toilet: with mod assist;Ambulation;with DME;3-in-1 ADL Goal: Toilet Transfer - Progress: Goal set today Pt Will Perform Toileting - Clothing Manipulation: with mod assist;Standing ADL Goal: Toileting - Clothing Manipulation - Progress: Goal set today Additional ADL Goal #1: pt will transfer to EOB with min assist in prep for an ADL task.  ADL Goal: Additional Goal #1 - Progress: Goal set today  Visit Information  Last OT Received On: 03/26/12 Assistance Needed: +2    Subjective Data  Subjective: It hurts Patient Stated Goal: agreeable to OT to increase independence   Prior Functioning  Home Living Lives With:  Alone Available Help at Discharge: Friend(s) Type of Home: House Home Layout: One  level Home Adaptive Equipment: Straight cane;Walker - rolling;Crutches Prior Function Level of Independence: Independent Driving: Yes Vocation: Retired Musician: No difficulties    Cognition  Overall Cognitive Status: Appears within functional limits for tasks assessed/performed Arousal/Alertness: Awake/alert Orientation Level: Appears intact for tasks assessed Behavior During Session: [facility] for tasks performed    Extremity/Trunk Assessment Right Upper Extremity Assessment RUE ROM/Strength/Tone: Within functional levels Left Upper Extremity Assessment LUE ROM/Strength/Tone: Within functional levels   Mobility Bed Mobility Bed Mobility: Supine to Sit Supine to Sit: 3: Mod assist;HOB elevated;With rails Details for Bed Mobility Assistance: increased time, use of pad to finish sliding hips around to EOB, verbal cues for technique Transfers Transfers: Sit to Stand;Stand to Sit Sit to Stand: 1: +2 Total assist;From elevated surface;With upper extremity assist;From bed;Other (comment) (bed raised) Sit to Stand: Patient Percentage: 50% Stand to Sit: 1: +2 Total assist;With upper extremity assist;To chair/3-in-1 Stand to Sit: Patient Percentage: 50% Details for Transfer Assistance: verbal cues for hand placement, increased time, assist with sliding R LE out in front to sit and to help control descent   Exercise    Balance Balance Balance Assessed: Yes Static Standing Balance Static Standing - Level of Assistance: 1: +2 Total assist;Patient percentage (comment);Other (comment) (pt 50% and holding to RW. Unable to free UEs to use in task)  End of Session OT - End of Session Equipment Utilized During Treatment: Gait belt Activity Tolerance: Patient limited by pain;Patient limited by fatigue Patient left: in chair;with call bell/phone within reach Nurse Communication: Mobility status CPM  Right Knee CPM Right Knee: [address]   Judithann Sauger Kline [phone] 03/26/2012, 10:18 AM

## 2012-03-26 NOTE — Progress Notes (Signed)
Physical Therapy Treatment Patient Details Name: Shane Pope MRN: [id number] DOB: 04-Jun-1931 Today's Date: 03/26/2012 Time: 1520-1540 PT Time Calculation (min): 20 min  PT Assessment / Plan / Recommendation Comments on Treatment Session  Pt progressing slowly and plans to D/C to [facility] for ST Rehab    Follow Up Recommendations  Skilled nursing facility    Barriers to Discharge        Equipment Recommendations  Defer to next venue    Recommendations for Other Services    Frequency 7X/week   Plan Discharge plan remains appropriate    Precautions / Restrictions Precautions Precautions: Knee Precaution Comments: Pt instructed on KI use for AMB Required Braces or Orthoses: Knee Immobilizer - Right Knee Immobilizer - Right: Discontinue once straight leg raise with < 10 degree lag Restrictions Weight Bearing Restrictions: No Other Position/Activity Restrictions: Pt aware he is WBAT   Pertinent Vitals/Pain C/o 6/10 knee pain, ICE applied    Mobility  Transfers: Sit to Stand;Stand to Sit Sit to Stand: 1: +2 Total assist;With upper extremity assist;From chair/3-in-1 Sit to Stand: Patient Percentage: 60% Stand to Sit: 1: +2 Total assist;With upper extremity assist;To chair/3-in-1 Stand to Sit: Patient Percentage: 50% Details for Transfer Assistance: 50% VC's on safety with turns and advancement of R LE prior to sit Ambulation/Gait Ambulation/Gait Assistance: 1: +2 Total assist Ambulation/Gait: Patient Percentage: 70% Ambulation Distance (Feet): 22 Feet Assistive device: Rolling walker Ambulation/Gait Assistance Details: 25% VC's on proper sequencing and proper walker to self distance Gait Pattern: Step-to pattern;Decreased stance time - right;Narrow base of support;Decreased stride length        PT Goals Acute Rehab PT Goals PT Goal Formulation: With patient Potential to Achieve Goals: Good Pt will go Sit to Stand: with supervision PT Goal: Sit to Stand - Progress:  Progressing toward goal Pt will go Stand to Sit: with supervision PT Goal: Stand to Sit - Progress: Progressing toward goal Pt will Ambulate: 51 - 150 feet;with min assist;with rolling walker PT Goal: Ambulate - Progress: Progressing toward goal  Visit Information  Last PT Received On: 03/26/12 Assistance Needed: +2    Subjective Data  Subjective: I'm tiered Patient Stated Goal: to go to [facility]    poor (meds)   Balance   poor  End of Session PT - End of Session Equipment Utilized During Treatment: Gait belt;Right knee immobilizer Activity Tolerance: Patient limited by fatigue;Patient limited by pain Patient left: in chair;with call bell/phone within reach;Other (comment) (ICE applied to R knee)   Shane Pope  PTA [facility]  Acute  Rehab Pager     [phone]

## 2012-03-27 ENCOUNTER — Inpatient Hospital Stay (HOSPITAL_COMMUNITY): Payer: Medicare Other

## 2012-03-27 DIAGNOSIS — Z9289 Personal history of other medical treatment: Secondary | ICD-10-CM

## 2012-03-27 DIAGNOSIS — E871 Hypo-osmolality and hyponatremia: Secondary | ICD-10-CM | POA: Diagnosis not present

## 2012-03-27 LAB — GLUCOSE, CAPILLARY
Glucose-Capillary: 153 mg/dL — ABNORMAL HIGH (ref 70–99)
Glucose-Capillary: 122 mg/dL — ABNORMAL HIGH (ref 70–99)
Glucose-Capillary: 148 mg/dL — ABNORMAL HIGH (ref 70–99)
Glucose-Capillary: 160 mg/dL — ABNORMAL HIGH (ref 70–99)

## 2012-03-27 LAB — CBC
HCT: 24.3 % — ABNORMAL LOW (ref 39.0–52.0)
Hemoglobin: 7.7 g/dL — ABNORMAL LOW (ref 13.0–17.0)
MCH: 24.7 pg — ABNORMAL LOW (ref 26.0–34.0)
MCV: 77.9 fL — ABNORMAL LOW (ref 78.0–100.0)
Platelets: 160 10*3/uL (ref 150–400)
RBC: 3.12 MIL/uL — ABNORMAL LOW (ref 4.22–5.81)
WBC: 5.5 10*3/uL (ref 4.0–10.5)

## 2012-03-27 LAB — PREPARE RBC (CROSSMATCH)

## 2012-03-27 MED ORDER — FUROSEMIDE 10 MG/ML IJ SOLN: 10.0000 mg | Freq: Once | INTRAMUSCULAR | Status: DC

## 2012-03-27 NOTE — Progress Notes (Signed)
Subjective: 3 Days Post-Op Procedure(s) (LRB): TOTAL KNEE ARTHROPLASTY (Right) Patient reports pain as mild.  He is still requiring O2 via [place].  His HGB is down further today to 7.7.  Discussed with Dr. Lequita Halt and will transfuse two units.  Will also check CXR today to R/O any pulmonary issues for low O2. Patient seen in rounds with Dr. Lequita Halt. Patient is well, but has had some minor complaints of fatigue and pain in the knee, requiring pain medications Plan is to go Skilled nursing facility after hospital stay [facility].  Will hold transfer today and get blood and CXR. probably tomorrow.  Objective: Vital signs in last 24 hours: Temp:  [98.9 F (37.2 C)-99.1 F (37.3 C)] 98.9 F (37.2 C) (05/23 0631) Pulse Rate:  [77-95] 83  (05/23 0631) Resp:  [16] 16  (05/23 0631) BP: (147-164)/(61-72) 154/69 mmHg (05/23 0631) SpO2:  [90 %-93 %] 92 % (05/23 0631)  Intake/Output from previous day:  Intake/Output Summary (Last 24 hours) at 03/27/12 0926 Last data filed at 03/27/12 0800  Gross per 24 hour  Intake 832.34 ml  Output   1050 ml  Net -217.66 ml    Intake/Output this shift: Total I/O In: 360 [P.O.:360] Out: -   Labs:  Basename 03/27/12 0428 03/26/12 0515 03/25/12 0428  HGB 7.7* 8.3* 9.1*    Basename 03/27/12 0428 03/26/12 0515  WBC 5.5 5.7  RBC 3.12* 3.38*  HCT 24.3* 26.2*  PLT 160 146*    Basename 03/26/12 0515 03/25/12 0428  NA 134* 136  K 4.2 4.3  CL 103 103  CO2 23 24  BUN 14 14  CREATININE 1.05 1.07  GLUCOSE 157* 199*  CALCIUM 8.2* 8.3*   No results found for this basename: LABPT:2,INR:2 in the last 72 hours  EXAM General - Patient is Alert, Appropriate and Oriented Extremity - Neurovascular intact Sensation intact distally Dressing/Incision - clean, dry, no drainage, healing Motor Function - intact, moving foot and toes well on exam.   Past Medical History  Diagnosis Date  . Hypertension   . Diabetes mellitus   . Anemia   . Blood transfusion      hx of   . GERD (gastroesophageal reflux disease)   . Arthritis   . 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 Fracture     hx of right wrist fracture   . Fracture, ribs     hx of on right side     Assessment/Plan: 3 Days Post-Op Procedure(s) (LRB): TOTAL KNEE ARTHROPLASTY (Right) Principal Problem:  *OA (osteoarthritis) of knee Active Problems:  Postop Acute blood loss anemia  Postop Transfusion   Plan for discharge tomorrow  DVT Prophylaxis - Xarelto Weight-Bearing as tolerated to right leg  Basename 03/27/12 0428 03/26/12 0515 03/25/12 0428  HGB 7.7* 8.3* 9.1*  Blood today - 2 units. Recheck labs in AM  [place], Marlowe Sax 03/27/2012, 9:26 AM

## 2012-03-27 NOTE — Progress Notes (Signed)
Physical Therapy Treatment Patient Details Name: Matthieu Loftus MRN: [id number] DOB: November 26, 1930 Today's Date: 03/27/2012 Time: 1308-6578 PT Time Calculation (min): 25 min  PT Assessment / Plan / Recommendation Comments on Treatment Session  Pt currently receiving blood.  performed TKR TE's then applied CPM.  Will mobilize after first unit.    Follow Up Recommendations  Skilled nursing facility    Barriers to Discharge        Equipment Recommendations  Defer to next venue    Recommendations for Other Services    Frequency 7X/week   Plan Discharge plan remains appropriate    Precautions / Restrictions   R KI for amb until able to perform 10 active SLR  Pertinent Vitals/Pain C/o 6/10 R knee pain during TE's, ICE applied and premedicated    Mobility    Tx session focused on TKR TE's   Exercises Total Joint Exercises Ankle Circles/Pumps: AROM;Both;10 reps;Supine Quad Sets: AROM;Both;10 reps;Supine Gluteal Sets: AROM;Both;10 reps;Supine Towel Squeeze: AROM;Both;10 reps;Supine Short Arc Quad: AAROM;Right;10 reps;Supine Heel Slides: AAROM;Right;10 reps;Supine Hip ABduction/ADduction: AAROM;Right;10 reps;Supine Straight Leg Raises: AAROM;Right;10 reps;Supine    PT Goals Acute Rehab PT Goals PT Goal Formulation: With patient Potential to Achieve Goals: Good  Visit Information  Last PT Received On: 03/27/12 Assistance Needed: +2    Subjective Data  Subjective: I don't like that thing (CPM) Patient Stated Goal: get to Rehab   Cognition    fair, drowsy   Balance   NT  End of Session PT - End of Session Activity Tolerance: Patient limited by fatigue;Patient limited by pain CPM Right Knee CPM Right Knee: On Right Knee Flexion (Degrees): 60  Right Knee Extension (Degrees): 10  Additional Comments: Applied CPM  at 11:50am after performing TE's and blood currently running   Felecia Shelling  PTA [facility]  Acute  Rehab Pager     [phone]

## 2012-03-27 NOTE — Progress Notes (Signed)
Physical Therapy Treatment Patient Details Name: Shane Pope MRN: [id number] DOB: 05/12/1931 Today's Date: 03/27/2012 Time: 4098-1191 PT Time Calculation (min): 25 min  PT Assessment / Plan / Recommendation Comments on Treatment Session  Pt completed first unit of blood so assited OOB to [facility] then attempted amb however pt too unsteady with max c/o pain/weakness/fatigue.  Positioned in recliner.  Pt progressing slowly and oplans to D/C to [facility] for ST Rehab    Follow Up Recommendations  Skilled nursing facility    Barriers to Discharge        Equipment Recommendations  Defer to next venue    Recommendations for Other Services    Frequency 7X/week   Plan Discharge plan remains appropriate    Precautions / Restrictions     Pertinent Vitals/Pain C/0 8/10, ICE applied    Mobility  Bed Mobility Bed Mobility: Supine to Sit Supine to Sit: 3: Mod assist Details for Bed Mobility Assistance: increased time and HOB elevated 45' Transfers Transfers: Sit to Stand;Stand to Sit Sit to Stand: 1: +2 Total assist;From bed;From chair/3-in-1 Sit to Stand: Patient Percentage: 50% Stand to Sit: 1: +2 Total assist;To chair/3-in-1 Stand to Sit: Patient Percentage: 50% Details for Transfer Assistance: 50% VC's on safety with turns and advancement of R LE prior to sit Ambulation/Gait Ambulation Distance (Feet): 0 Feet Ambulation/Gait Assistance Details: Attepmted amb however unable 2nd max c/o weakness and tremors throughout Stairs: No Wheelchair Mobility Wheelchair Mobility: No          PT Goals Acute Rehab PT Goals PT Goal Formulation: With patient Potential to Achieve Goals: Good  Visit Information  Last PT Received On: 03/27/12 Assistance Needed: +2    Subjective Data  Subjective: I can't walk very well Patient Stated Goal: n/a   Cognition    good, groggy   Balance   poor  End of Session PT - End of Session Activity Tolerance: Patient limited by fatigue;Patient  limited by pain CPM Right Knee CPM Right Knee: On Right Knee Flexion (Degrees): 60  Right Knee Extension (Degrees): 10  Additional Comments: Applied on at 11:50am after performing TE's and blood currently running   Felecia Shelling  PTA [facility]  Acute  Rehab Pager     [phone]

## 2012-03-28 DIAGNOSIS — J9811 Atelectasis: Secondary | ICD-10-CM | POA: Diagnosis not present

## 2012-03-28 DIAGNOSIS — R0902 Hypoxemia: Secondary | ICD-10-CM | POA: Diagnosis not present

## 2012-03-28 LAB — BASIC METABOLIC PANEL
BUN: 15 mg/dL (ref 6–23)
GFR calc Af Amer: 74 mL/min — ABNORMAL LOW (ref >90)
GFR calc non Af Amer: 64 mL/min — ABNORMAL LOW (ref >90)
Potassium: 3.7 mEq/L (ref 3.5–5.1)
Sodium: 136 mEq/L (ref 135–145)

## 2012-03-28 LAB — TYPE AND SCREEN: Unit division: 0

## 2012-03-28 LAB — CBC
Hemoglobin: 9.2 g/dL — ABNORMAL LOW (ref 13.0–17.0)
MCHC: 32.4 g/dL (ref 30.0–36.0)
WBC: 5.4 10*3/uL (ref 4.0–10.5)

## 2012-03-28 LAB — GLUCOSE, CAPILLARY
Glucose-Capillary: 136 mg/dL — ABNORMAL HIGH (ref 70–99)
Glucose-Capillary: 152 mg/dL — ABNORMAL HIGH (ref 70–99)

## 2012-03-28 MED ORDER — RIVAROXABAN 10 MG PO TABS: 10.0000 mg | ORAL_TABLET | Freq: Every day | ORAL | Status: DC

## 2012-03-28 MED ORDER — METHOCARBAMOL 500 MG PO TABS: 500.0000 mg | ORAL_TABLET | Freq: Four times a day (QID) | ORAL | Status: AC | PRN

## 2012-03-28 MED ORDER — ACETAMINOPHEN 325 MG PO TABS: 650.0000 mg | ORAL_TABLET | Freq: Four times a day (QID) | ORAL | Status: DC | PRN

## 2012-03-28 MED ORDER — BISACODYL 10 MG RE SUPP: 10.0000 mg | Freq: Every day | RECTAL | Status: AC | PRN

## 2012-03-28 MED ORDER — HYDROCODONE-ACETAMINOPHEN 5-325 MG PO TABS
1.0000 | ORAL_TABLET | ORAL | Status: DC | PRN
  Administered 2012-03-28: 1 via ORAL
  Filled 2012-03-28: qty 1

## 2012-03-28 MED ORDER — POLYETHYLENE GLYCOL 3350 17 G PO PACK: 17.0000 g | PACK | Freq: Every day | ORAL | Status: AC | PRN

## 2012-03-28 MED ORDER — DSS 100 MG PO CAPS: 100.0000 mg | ORAL_CAPSULE | Freq: Two times a day (BID) | ORAL | Status: AC

## 2012-03-28 MED ORDER — ONDANSETRON HCL 4 MG PO TABS: 4.0000 mg | ORAL_TABLET | Freq: Four times a day (QID) | ORAL | Status: AC | PRN

## 2012-03-28 MED ORDER — HYDROCODONE-ACETAMINOPHEN 5-325 MG PO TABS: 1.0000 | ORAL_TABLET | ORAL | Status: AC | PRN

## 2012-03-28 NOTE — Progress Notes (Signed)
Physical Therapy Treatment Patient Details Name: Shane Pope MRN: [id number] DOB: 01-Jan-1931 Today's Date: 03/28/2012 Time: 1140-1209 PT Time Calculation (min): 29 min  PT Assessment / Plan / Recommendation Comments on Treatment Session  pt progressing, improved overall today; flat affect    Follow Up Recommendations  Skilled nursing facility    Barriers to Discharge        Equipment Recommendations  Defer to next venue    Recommendations for Other Services    Frequency 7X/week   Plan Discharge plan remains appropriate    Precautions / Restrictions Precautions Precautions: Knee Precaution Comments: Pt instructed on KI use for AMB Required Braces or Orthoses: Knee Immobilizer - Right Knee Immobilizer - Right: Discontinue once straight leg raise with < 10 degree lag   Pertinent Vitals/Pain     Mobility  Transfers Transfers: Sit to Stand;Stand to Sit Sit to Stand: 3: Mod assist;From chair/3-in-1;With armrests;With upper extremity assist Stand to Sit: 3: Mod assist;With armrests;With upper extremity assist;To chair/3-in-1 Details for Transfer Assistance: 50% VC's on safety with turns and advancement of R LE prior to sit Ambulation/Gait Ambulation/Gait Assistance: 4: Min assist;3: Mod assist Ambulation Distance (Feet): 50 Feet Assistive device: Rolling walker Ambulation/Gait Assistance Details: verbal cues for Rw distance from self and posture, sequence Gait Pattern: Step-to pattern;Decreased stance time - right;Narrow base of support;Decreased stride length    Exercises Total Joint Exercises Ankle Circles/Pumps: AROM;Both;10 reps Quad Sets: AROM;10 reps;Both Heel Slides: AAROM;Right;10 reps Hip ABduction/ADduction: AAROM;Right;10 reps Straight Leg Raises: AAROM;10 reps;Supine   PT Diagnosis:    PT Problem List:   PT Treatment Interventions:     PT Goals Acute Rehab PT Goals Time For Goal Achievement: 04/01/12 Potential to Achieve Goals: Good Pt will go Sit to  Stand: with supervision PT Goal: Sit to Stand - Progress: Progressing toward goal Pt will go Stand to Sit: with supervision PT Goal: Stand to Sit - Progress: Progressing toward goal Pt will Ambulate: 51 - 150 feet;with min assist;with rolling walker PT Goal: Ambulate - Progress: Progressing toward goal  Visit Information  Last PT Received On: 03/28/12 Assistance Needed: +2    Subjective Data  Subjective: whose idea was this?   Cognition       Balance     End of Session PT - End of Session Equipment Utilized During Treatment: Gait belt;Right knee immobilizer Activity Tolerance: Patient limited by fatigue Patient left: in chair;with call bell/phone within reach    [facility] 03/28/2012, 12:23 PM

## 2012-03-28 NOTE — Discharge Instructions (Signed)
Dr. Gaynelle Arabian Total Joint Specialist [facility] [address], [place], [place] [address] [phone]  TOTAL KNEE REPLACEMENT POSTOPERATIVE DIRECTIONS    Knee Rehabilitation, Guidelines Following Surgery  Results after knee surgery are often greatly improved when you follow the exercise, range of motion and muscle strengthening exercises prescribed by your doctor. Safety measures are also important to protect the knee from further injury. Any time any of these exercises cause you to have increased pain or swelling in your knee joint, decrease the amount until you are comfortable again and slowly increase them. If you have problems or questions, call your caregiver or physical therapist for advice.   HOME CARE INSTRUCTIONS  Remove items at home which could result in a fall. This includes throw rugs or furniture in walking pathways.  Continue medications as instructed at time of discharge. You may have some home medications which will be placed on hold until you complete the course of blood thinner medication.  You may start showering once you are discharged home but do not submerge the incision under water.  Walk with walker as instructed.   Use walker as long as suggested by your caregivers.  Avoid periods of inactivity such as sitting longer than an hour when not asleep. This helps prevent blood clots.  You may put full weight on your legs and walk as much as is comfortable.  You may resume a sexual relationship in one month or when given the OK by your doctor.  You may return to work once you are cleared by your doctor.  Do not drive a car for 6 weeks or until released by you surgeon.   Do not drive while taking narcotics.  Wear the elastic stockings for three weeks following surgery during the day but you may remove then at night. Make sure you keep all of your appointments after your operation with all of your doctors and caregivers. You should call  the office at the above phone number and make an appointment for approximately two weeks after the date of your surgery. Change the dressing daily and reapply a dry dressing each time. Please pick up a stool softener and laxative for home use as long as you are requiring pain medications. Continue to use ice on the knee for pain and swelling from surgery. It is important for you to complete the blood thinner medication as prescribed by your doctor.  RANGE OF MOTION AND STRENGTHENING EXERCISES  Rehabilitation of the knee is important following a knee injury or an operation. After just a few days of immobilization, the muscles of the thigh which control the knee become weakened and shrink (atrophy). Knee exercises are designed to build up the tone and strength of the thigh muscles and to improve knee motion. Often times heat used for twenty to thirty minutes before working out will loosen up your tissues and help with improving the range of motion but do not use heat for the first two weeks following surgery. These exercises can be done on a training (exercise) mat, on the floor, on a table or on a bed. Use what ever works the best and is most comfortable for you Knee exercises include:  Leg Lifts - While your knee is still immobilized in a splint or cast, you can do straight leg raises. Lift the leg to 60 degrees, hold for 3 sec, and slowly lower the leg. Repeat 10-20 times 2-3 times daily. Perform this exercise against resistance later as your  knee gets better.  Quad and Hamstring Sets - Tighten up the muscle on the front of the thigh (Quad) and hold for 5-10 sec. Repeat this 10-20 times hourly. Hamstring sets are done by pushing the foot backward against an object and holding for 5-10 sec. Repeat as with quad sets.  A rehabilitation program following serious knee injuries can speed recovery and prevent re-injury in the future due to weakened muscles. Contact your doctor or a physical therapist for more  information on knee rehabilitation.   SKILLED REHAB INSTRUCTIONS: If the patient is transferred to a skilled rehab facility following release from the hospital, a list of the current medications will be sent to the facility for the patient to continue.  When discharged from the skilled rehab facility, please have the facility set up the patient's Home Health Physical Therapy prior to being released. Also, the skilled facility will be responsible for providing the patient with their medications at time of release from the facility to include their pain medication, the muscle relaxants, and their blood thinner medication. If the patient is still at the rehab facility at time of the two week follow up appointment, the skilled rehab facility will also need to assist the patient in arranging follow up appointment in our office and any transportation needs.  MAKE SURE YOU:  Understand these instructions.  Will watch your condition.  Will get help right away if you are not doing well or get worse.  Document Released: 10/22/2005 Document Revised: 10/11/2011 Document Reviewed: 04/11/2007  [facility] Patient Information 2012 [place], [place].   Take Xarelto for two and a half more weeks, then discontinue Xarelto. Once the patient has completed the Xarelto, they may resume the 81 mg Aspirin.  When discharged from the skilled rehab facility, please have the facility set up the patient's Home Health Physical Therapy prior to being released.  Also provide the patient with their medications at time of release from the facility to include their pain medication, the muscle relaxants, and their blood thinner medication.  If the patient is still at the rehab facility at time of follow up appointment, please also assist the patient in arranging follow up appointment in our office and any transportation needs.

## 2012-03-28 NOTE — Progress Notes (Signed)
Discharge summary sent to payer through [employer]  

## 2012-03-28 NOTE — Progress Notes (Signed)
Clinical Social Work Department CLINICAL SOCIAL WORK PLACEMENT NOTE 03/28/2012  Patient:  Shane Pope, Shane Pope  Account Number:  [id number] Admit date:  03/24/2012  Clinical Social Worker:  Cori Razor, LCSW  Date/time:  03/25/2012 11:14 AM  Clinical Social Work is seeking post-discharge placement for this patient at the following level of care:   SKILLED NURSING   (*CSW will update this form in Epic as items are completed)     Patient/family provided with [facility] Department of Clinical Social Work's list of facilities offering this level of care within the geographic area requested by the patient (or if unable, by the patient's family).    Patient/family informed of their freedom to choose among providers that offer the needed level of care, that participate in Medicare, Medicaid or managed care program needed by the patient, have an available bed and are willing to accept the patient.    Patient/family informed of [facility]' ownership interest in [facility], as well as of the fact that they are under no obligation to receive care at this facility.  PASARR submitted to [employer] on 03/25/2012 PASARR number received from [employer] on 03/25/2012  FL2 transmitted to all facilities in geographic area requested by pt/family on  03/25/2012 FL2 transmitted to all facilities within larger geographic area on   Patient informed that his/her managed care company has contracts with or will negotiate with  certain facilities, including the following:     Patient/family informed of bed offers received:  03/25/2012 Patient chooses bed at [facility] Physician recommends and patient chooses bed at    Patient to be transferred to [facility] on  03/28/2012 Patient to be transferred to facility by [facility]  The following physician request were entered in Epic:   Additional Comments: [employer] provided prior approval for SNF placement and ambulance transport.  Cori Razor  LCSW   [phone]

## 2012-03-28 NOTE — Discharge Summary (Signed)
Physician Discharge Summary   Patient ID: Shane Pope MRN: [id number] DOB/AGE: Apr 08, 1931 78 y.o.  Admit date: 03/24/2012 Discharge date: 03/28/2012  Primary Diagnosis: Osteoarthritis Right knee  Admission Diagnoses:  Past Medical History  Diagnosis Date  . Hypertension   . Diabetes mellitus   . Anemia   . Blood transfusion     hx of   . GERD (gastroesophageal reflux disease)   . Arthritis   . 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 Fracture     hx of right wrist fracture   . Fracture, ribs     hx of on right side    Discharge Diagnoses:   Principal Problem:  *OA (osteoarthritis) of knee Active Problems:  Postop Acute blood loss anemia  Postop Transfusion  Postop Hyponatremia  Postop Atelectasis  Postop Hypoxia  Procedure:  Procedure(s) (LRB): TOTAL KNEE ARTHROPLASTY (Right)   Consults: None  HPI: Shane Pope is a 78 y.o. year old male with end stage OA of his right knee with progressively worsening pain and dysfunction. He has constant pain, with activity and at rest and significant functional deficits with difficulties even with ADLs. He has had extensive non-op management including analgesics, injections of cortisone and viscosupplements, and home exercise program, but remains in significant pain with significant dysfunction. Radiographs show bone on bone changes medial and patellofemoral with tibial subchondral sclerosis. He presents now for right Total Knee Arthroplasty.   Laboratory Data: Hospital Outpatient Visit on 03/13/2012  Component Date Value Range Status  . aPTT (seconds) 03/13/2012 33  24-37 Final  . WBC (K/uL) 03/13/2012 5.3  4.0-10.5 Final  . RBC (MIL/uL) 03/13/2012 5.20  4.22-5.81 Final  . Hemoglobin (g/dL) 40/98/1191 47.8* 29.5-62.1 Final  . HCT (%) 03/13/2012 40.4  39.0-52.0 Final  . MCV (fL) 03/13/2012 77.7* 78.0-100.0 Final  . MCH (pg) 03/13/2012 24.4* 26.0-34.0 Final  . MCHC (g/dL) 30/86/5784 69.6  29.5-28.4 Final  . RDW (%) 03/13/2012  19.3* 11.5-15.5 Final  . Platelets (K/uL) 03/13/2012 215  150-400 Final  . Sodium (mEq/L) 03/13/2012 135  135-145 Final  . Potassium (mEq/L) 03/13/2012 4.3  3.5-5.1 Final  . Chloride (mEq/L) 03/13/2012 102  96-112 Final  . CO2 (mEq/L) 03/13/2012 23  19-32 Final  . Glucose, Bld (mg/dL) 13/24/4010 272* 53-66 Final  . BUN (mg/dL) 44/01/4741 12  5-95 Final  . Creatinine, Ser (mg/dL) 63/87/5643 3.29  5.18-8.41 Final  . Calcium (mg/dL) 66/04/3015 9.0  0.1-09.3 Final  . Total Protein (g/dL) 23/55/7322 7.3  0.2-5.4 Final  . Albumin (g/dL) 27/04/2375 3.5  2.8-3.1 Final  . AST (U/L) 03/13/2012 121* 0-37 Final  . ALT (U/L) 03/13/2012 104* 0-53 Final  . Alkaline Phosphatase (U/L) 03/13/2012 59  39-117 Final  . Total Bilirubin (mg/dL) 51/76/1607 0.7  3.7-1.0 Final  . GFR calc non Af Amer (mL/min) 03/13/2012 61* >90 Final  . GFR calc Af Amer (mL/min) 03/13/2012 71* >90 Final   Comment:                                 The eGFR has been calculated                          using the CKD EPI equation.                          This calculation has  not been                          validated in all clinical                          situations.                          eGFR's persistently                          <90 mL/min signify                          possible Chronic Kidney Disease.  Marland Kitchen Prothrombin Time (seconds) 03/13/2012 14.7  11.6-15.2 Final  . INR  03/13/2012 1.13  0.00-1.49 Final  . Color, Urine  03/13/2012 YELLOW  YELLOW Final  . APPearance  03/13/2012 CLEAR  CLEAR Final  . Specific Gravity, Urine  03/13/2012 1.017  1.005-1.030 Final  . pH  03/13/2012 7.0  5.0-8.0 Final  . Glucose, UA (mg/dL) 91/47/8295 NEGATIVE  NEGATIVE Final  . Hgb urine dipstick  03/13/2012 NEGATIVE  NEGATIVE Final  . Bilirubin Urine  03/13/2012 NEGATIVE  NEGATIVE Final  . Ketones, ur (mg/dL) 62/13/0865 NEGATIVE  NEGATIVE Final  . Protein, ur (mg/dL) 78/46/9629 NEGATIVE  NEGATIVE Final  . Urobilinogen, UA (mg/dL)  52/84/1324 4.0* 4.0-1.0 Final  . Nitrite  03/13/2012 NEGATIVE  NEGATIVE Final  . Leukocytes, UA  03/13/2012 NEGATIVE  NEGATIVE Final   MICROSCOPIC NOT DONE ON URINES WITH NEGATIVE PROTEIN, BLOOD, LEUKOCYTES, NITRITE, OR GLUCOSE <1000 mg/dL.  Marland Kitchen MRSA, PCR  03/13/2012 NEGATIVE  NEGATIVE Final  . Staphylococcus aureus  03/13/2012 NEGATIVE  NEGATIVE Final   Comment:                                 The Xpert SA Assay (FDA                          approved for NASAL specimens                          only), is one component of                          a comprehensive surveillance                          program.  It is not intended                          to diagnose infection nor to                          guide or monitor treatment.    Basename 03/28/12 0415 03/27/12 0428 03/26/12 0515  HGB 9.2* 7.7* 8.3*    Basename 03/28/12 0415 03/27/12 0428  WBC 5.4 5.5  RBC 3.58* 3.12*  HCT 28.4* 24.3*  PLT 183 160    Basename 03/28/12 0415 03/26/12 0515  NA 136 134*  K 3.7 4.2  CL 102 103  CO2 23 23  BUN 15 14  CREATININE 1.08 1.05  GLUCOSE 146* 157*  CALCIUM 8.2* 8.2*   No results found for this basename: LABPT:2,INR:2 in the last 72 hours  X-Rays:Dg Chest 2 View  03/27/2012  *RADIOLOGY REPORT*  Clinical Data: Hypoxia after right knee arthroplasty.  CHEST - 2 VIEW  Comparison: 03/13/2012  Findings: Significant chronic lung disease again identified. Bibasilar atelectasis present.  No evidence of overt pulmonary edema or focal pulmonary consolidation.  Stable cardiomegaly.  No significant pleural effusions.  IMPRESSION: Chronic lung disease and bibasilar atelectasis.  Original Report Authenticated By: Reola Calkins, M.D.    Chest 2 View  03/13/2012  *RADIOLOGY REPORT*  Clinical Data: Preoperative respiratory evaluation prior to knee surgery.  Smoker with history of hypertension and diabetes. History of interstitial fibrosis.  CHEST - 2 VIEW 03/13/2012:  Comparison: Two-view chest  x-ray 03/24/2010 [facility].  Findings: Cardiac silhouette mildly enlarged but stable.  Hilar and mediastinal contours otherwise unremarkable.  Chronic interstitial lung disease, unchanged.  Nodular opacity at the right lung base on the PA image, not definitely localized on the lateral, and not visualized on the prior examination.  No new pulmonary parenchymal abnormalities elsewhere.  Stable biapical pleuroparenchymal scarring.  Multiple old healed right rib fractures.  Degenerative changes involving the thoracic spine.  Generalized osteopenia.  IMPRESSION:  1.  Nodule involving the right lung base on the PA image, not localized on the lateral image. 2.  Stable mild cardiomegaly.  Stable chronic interstitial pulmonary fibrosis.  No acute cardiopulmonary disease.  CT chest with contrast may be helpful in further evaluation to confirm or deny the presence of the nodule at the right lung base.  These results will be called to the ordering clinician or representative by the Radiologist Assistant, and communication documented in the PACS Dashboard.  Original Report Authenticated By: Arnell Sieving, M.D.   Ct Chest W Contrast  03/17/2012  *RADIOLOGY REPORT*  Clinical Data: Lung nodule on preop chest x-ray, follow-up  CT CHEST WITH CONTRAST  Technique:  Multidetector CT imaging of the chest was performed following the standard protocol during bolus administration of intravenous contrast.  Contrast: 75mL OMNIPAQUE IOHEXOL 300 MG/ML  SOLN  Comparison: Chest x-ray of 03/13/2012  Findings: No suspicious lung nodule is seen.  A tiny nodule is present in the right middle lobe of doubtful significance, with a small nodule also present in the right upper lobe of doubtful significance.  Diffuse changes of centrilobular emphysema are noted.  No pleural effusion is seen.  Multiple old right rib fractures are present posteriorly and laterally.  On soft tissue window images there is diffuse enlargement of the thyroid  gland consistent with thyroid goiter.  No definite nodule is seen although a few calcifications are present.  There are atheromatous changes throughout the entire thoracic aorta particularly within the descending thoracic aorta.  The pulmonary arteries opacify with no significant abnormality.  Coronary artery calcifications are present in both the left anterior descending and left circumflex distribution.  No mediastinal or hilar adenopathy is seen.  There are degenerative changes diffusely throughout the thoracic spine.  IMPRESSION:  1.  No suspicious lung nodule is seen. 2.  Diffuse changes of centrilobular emphysema. 3.  Probable thyroid goiter.  Correlate clinically. 4.  Atheromatous change diffusely throughout the thoracic aorta with coronary artery calcifications noted as well.  Original Report Authenticated By: Juline Patch, M.D.    EKG: Orders placed during the hospital encounter of 03/13/12  . EKG 12-LEAD  . EKG 12-LEAD  Hospital Course: Patient was admitted to [facility] and taken to the OR and underwent the above state procedure without complications.  Patient tolerated the procedure well and was later transferred to the recovery room and then to the orthopaedic floor for postoperative care.  They were given PO and IV analgesics for pain control following their surgery.  They were given 24 hours of postoperative antibiotics and started on DVT prophylaxis in the form of Xarelto.   PT and OT were ordered for total joint protocol.  Discharge planning consulted to help with postop disposition and equipment needs.  Patient had a tough night on the evening of surgery and did not get any sleep but started to get up OOB with therapy on day one.  PCA was discontinued and they were weaned over to PO meds.  Hemovac drain was pulled without difficulty. He wanted to look into [facility] after the hospital stay.  Continued to work with therapy into day two but did have moderate pain.  Dressing  was changed on day two and the incision was healing well.  By day three, he was still requiring O2 via [place]. His HGB was down further today to 7.7. Discussed with Dr. Lequita Halt and transfused two units. Will also check CXR today to R/O any pulmonary issues for low O2.  CHEST - 2 VIEW Findings: Significant chronic lung disease again identified.  Bibasilar atelectasis present. No evidence of overt pulmonary  edema or focal pulmonary consolidation. Stable cardiomegaly. No  significant pleural effusions.  IMPRESSION:  Chronic lung disease and bibasilar atelectasis. Continued to encourage I.S. To keep lungs clear. He was unable to get up and walk on day three. On day four, even though patient described pain, he kept falling asleep during the visit this morning. Will decrease the pain meds and try to get him off the oxygen. HGB was back up to 9.2 after two units. If he progresses with therapy and meeting goals, then we will send him over to [facility] later today.  Incision was healing well. If he doesn't improve may keep him until Saturday and then transfer him over then.  Discharge Medications: Prior to Admission medications   Medication Sig Start Date End Date Taking? Authorizing Provider  atorvastatin (LIPITOR) 40 MG tablet Take 40 mg by mouth daily with breakfast.   Yes Historical Provider, MD  fenofibrate 160 MG tablet Take 160 mg by mouth daily with breakfast.   Yes Historical Provider, MD  ferrous sulfate 325 (65 FE) MG tablet Take 325 mg by mouth daily with breakfast.   Yes Historical Provider, MD  glipiZIDE (GLUCOTROL) 10 MG tablet Take 10 mg by mouth 2 (two) times daily before a meal. Pt takes 5 mg   Yes Historical Provider, MD  lisinopril-hydrochlorothiazide (PRINZIDE,ZESTORETIC) 20-25 MG per tablet Take 1 tablet by mouth daily with breakfast.   Yes Historical Provider, MD  metFORMIN (GLUCOPHAGE) 1000 MG tablet Take 1,000 mg by mouth 2 (two) times daily with a meal.   Yes Historical Provider, MD    omeprazole (PRILOSEC) 40 MG capsule Take 40 mg by mouth daily.   Yes Historical Provider, MD  acetaminophen (TYLENOL) 325 MG tablet Take 2 tablets (650 mg total) by mouth every 6 (six) hours as needed (or Fever >/= 101). 03/28/12 03/28/13  Lyden Redner Julien Girt, PA  bisacodyl (DULCOLAX) 10 MG suppository Place 1 suppository (10 mg total) rectally daily as needed. 03/28/12 04/07/12  Kinston Magnan, PA  docusate sodium 100 MG CAPS Take 100 mg by  mouth 2 (two) times daily. 03/28/12 04/07/12  Keeshawn Fakhouri, PA  HYDROcodone-acetaminophen (NORCO) 5-325 MG per tablet Take 1-2 tablets by mouth every 4 (four) hours as needed. 03/28/12 04/07/12  Revere Maahs, PA  Liraglutide (VICTOZA) 18 MG/3ML SOLN Inject into the skin every evening.    Historical Provider, MD  methocarbamol (ROBAXIN) 500 MG tablet Take 1 tablet (500 mg total) by mouth every 6 (six) hours as needed. 03/28/12 04/07/12  Xxavier Noon, PA  ondansetron (ZOFRAN) 4 MG tablet Take 1 tablet (4 mg total) by mouth every 6 (six) hours as needed for nausea. 03/28/12 04/04/12  Jewelz Ricklefs, PA  polyethylene glycol (MIRALAX / GLYCOLAX) packet Take 17 g by mouth daily as needed. 03/28/12 03/31/12  Jenisse Vullo Julien Girt, PA  rivaroxaban (XARELTO) 10 MG TABS tablet Take 1 tablet (10 mg total) by mouth daily with breakfast. Take for two and a half more weeks, then discontinue Xarelto. 03/28/12   Lyan Holck Julien Girt, PA    Diet: Cardiac diet and Diabetic diet Activity:WBAT Follow-up:in 2 weeks Disposition - Skilled nursing facility Discharged Condition: fair   Discharge Orders    Future Orders Please Complete By Expires   Diet - low sodium heart healthy      Diet Carb Modified      Call MD / Call 911      Comments:   If you experience chest pain or shortness of breath, CALL 911 and be transported to the hospital emergency room.  If you develope a fever above 101 F, pus (white drainage) or increased drainage or redness at the wound, or calf  pain, call your surgeon's office.   Discharge instructions      Comments:   Pick up stool softner and laxative for home. Do not submerge incision under water. May shower. Continue to use ice for pain and swelling from surgery.  Take Xarelto for two and a half more weeks, then discontinue Xarelto. Once the patient has completed the Xarelto, they may resume the 81 mg Aspirin.  When discharged from the skilled rehab facility, please have the facility set up the patient's Home Health Physical Therapy prior to being released.  Also provide the patient with their medications at time of release from the facility to include their pain medication, the muscle relaxants, and their blood thinner medication.  If the patient is still at the rehab facility at time of follow up appointment, please also assist the patient in arranging follow up appointment in our office and any transportation needs.    Constipation Prevention      Comments:   Drink plenty of fluids.  Prune juice may be helpful.  You may use a stool softener, such as Colace (over the counter) 100 mg twice a day.  Use MiraLax (over the counter) for constipation as needed.   Increase activity slowly as tolerated      Patient may shower      Comments:   You may shower without a dressing once there is no drainage.  Do not wash over the wound.  If drainage remains, do not shower until drainage stops.   Driving restrictions      Comments:   No driving until released by the physician.   Lifting restrictions      Comments:   No lifting until released by the physician.   TED hose      Comments:   Use stockings (TED hose) for 3 weeks on both leg(s).  You may remove them at night for sleeping.  Change dressing      Comments:   Change dressing daily with sterile 4 x 4 inch gauze dressing and apply TED hose. Do not submerge the incision under water.   Do not put a pillow under the knee. Place it under the heel.      Do not sit on low chairs,  stoools or toilet seats, as it may be difficult to get up from low surfaces        Medication List  As of 03/28/2012  9:02 AM   STOP taking these medications         aspirin 81 MG chewable tablet      meloxicam 7.5 MG tablet      omega-3 acid ethyl esters 1 G capsule         TAKE these medications         acetaminophen 325 MG tablet   Commonly known as: TYLENOL   Take 2 tablets (650 mg total) by mouth every 6 (six) hours as needed (or Fever >/= 101).      atorvastatin 40 MG tablet   Commonly known as: LIPITOR   Take 40 mg by mouth daily with breakfast.      bisacodyl 10 MG suppository   Commonly known as: DULCOLAX   Place 1 suppository (10 mg total) rectally daily as needed.      DSS 100 MG Caps   Take 100 mg by mouth 2 (two) times daily.      fenofibrate 160 MG tablet   Take 160 mg by mouth daily with breakfast.      ferrous sulfate 325 (65 FE) MG tablet   Take 325 mg by mouth daily with breakfast.      glipiZIDE 10 MG tablet   Commonly known as: GLUCOTROL   Take 10 mg by mouth 2 (two) times daily before a meal. Pt takes 5 mg      HYDROcodone-acetaminophen 5-325 MG per tablet   Commonly known as: NORCO   Take 1-2 tablets by mouth every 4 (four) hours as needed.      lisinopril-hydrochlorothiazide 20-25 MG per tablet   Commonly known as: PRINZIDE,ZESTORETIC   Take 1 tablet by mouth daily with breakfast.      metFORMIN 1000 MG tablet   Commonly known as: GLUCOPHAGE   Take 1,000 mg by mouth 2 (two) times daily with a meal.      methocarbamol 500 MG tablet   Commonly known as: ROBAXIN   Take 1 tablet (500 mg total) by mouth every 6 (six) hours as needed.      omeprazole 40 MG capsule   Commonly known as: PRILOSEC   Take 40 mg by mouth daily.      ondansetron 4 MG tablet   Commonly known as: ZOFRAN   Take 1 tablet (4 mg total) by mouth every 6 (six) hours as needed for nausea.      polyethylene glycol packet   Commonly known as: MIRALAX / GLYCOLAX   Take  17 g by mouth daily as needed.      rivaroxaban 10 MG Tabs tablet   Commonly known as: XARELTO   Take 1 tablet (10 mg total) by mouth daily with breakfast. Take for two and a half more weeks, then discontinue Xarelto.      VICTOZA 18 MG/3ML Soln   Generic drug: Liraglutide   Inject into the skin every evening.           Follow-up Information  Follow up with Loanne Drilling, MD. Schedule an appointment as soon as possible for a visit in 2 weeks.   Contact information:   [facility] [address], [address] [place] [address] [phone]          Signed: Patrica Duel 03/28/2012, 9:02 AM

## 2012-03-28 NOTE — Progress Notes (Signed)
Subjective: 4 Days Post-Op Procedure(s) (LRB): TOTAL KNEE ARTHROPLASTY (Right) Patient reports pain as mild and moderate.  Even though patient describes pain, he keeps falling asleep during the visit this morning.  Will decrease the pain meds and try to get him off the oxygen. Patient seen in rounds for Dr. Lequita Halt. Patient is well, but has had some minor complaints of pain in the knee, requiring pain medications Plan is to go to [facility] today.  Will set up transfer for later today.  Objective: Vital signs in last 24 hours: Temp:  [97.2 F (36.2 C)-99.6 F (37.6 C)] 98.9 F (37.2 C) (05/24 0559) Pulse Rate:  [72-87] 77  (05/24 0559) Resp:  [16-20] 18  (05/24 0559) BP: (130-178)/(55-90) 168/68 mmHg (05/24 0559) SpO2:  [93 %-96 %] 93 % (05/24 0559)  Intake/Output from previous day:  Intake/Output Summary (Last 24 hours) at 03/28/12 0833 Last data filed at 03/28/12 0559  Gross per 24 hour  Intake 749.17 ml  Output   1075 ml  Net -325.83 ml    Intake/Output this shift:    Labs:  Basename 03/28/12 0415 03/27/12 0428 03/26/12 0515  HGB 9.2* 7.7* 8.3*    Basename 03/28/12 0415 03/27/12 0428  WBC 5.4 5.5  RBC 3.58* 3.12*  HCT 28.4* 24.3*  PLT 183 160    Basename 03/28/12 0415 03/26/12 0515  NA 136 134*  K 3.7 4.2  CL 102 103  CO2 23 23  BUN 15 14  CREATININE 1.08 1.05  GLUCOSE 146* 157*  CALCIUM 8.2* 8.2*   No results found for this basename: LABPT:2,INR:2 in the last 72 hours  EXAM: General - Patient is Appropriate and Oriented but somewhat sedated during the visit. Extremity - Neurovascular intact Sensation intact distally Dorsiflexion/Plantar flexion intact Incision - clean, dry, no drainage, healing Motor Function - intact, moving foot and toes well on exam.   Assessment/Plan: 4 Days Post-Op Procedure(s) (LRB): TOTAL KNEE ARTHROPLASTY (Right) Procedure(s) (LRB): TOTAL KNEE ARTHROPLASTY (Right) Past Medical History  Diagnosis Date  . Hypertension    . Diabetes mellitus   . Anemia   . Blood transfusion     hx of   . GERD (gastroesophageal reflux disease)   . Arthritis   . 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 Fracture     hx of right wrist fracture   . Fracture, ribs     hx of on right side    Principal Problem:  *OA (osteoarthritis) of knee Active Problems:  Postop Acute blood loss anemia  Postop Transfusion  Postop Hyponatremia   Discharge to SNF as long as he improves. Diet - Cardiac diet and Diabetic diet Follow up - in 2 weeks Activity - WBAT Disposition - Skilled nursing facility Condition Upon Discharge - Stable D/C Meds - See DC Summary DVT Prophylaxis - Xarelto  Nehemie Casserly 03/28/2012, 8:33 AM

## 2012-05-19 ENCOUNTER — Ambulatory Visit: Payer: Medicare Other | Attending: Orthopedic Surgery | Admitting: Physical Therapy

## 2012-05-19 DIAGNOSIS — M25569 Pain in unspecified knee: Secondary | ICD-10-CM | POA: Insufficient documentation

## 2012-05-19 DIAGNOSIS — R262 Difficulty in walking, not elsewhere classified: Secondary | ICD-10-CM | POA: Insufficient documentation

## 2012-05-19 DIAGNOSIS — IMO0001 Reserved for inherently not codable concepts without codable children: Secondary | ICD-10-CM | POA: Insufficient documentation

## 2012-05-19 DIAGNOSIS — M25669 Stiffness of unspecified knee, not elsewhere classified: Secondary | ICD-10-CM | POA: Insufficient documentation

## 2012-05-22 ENCOUNTER — Ambulatory Visit: Payer: Medicare Other | Admitting: Physical Therapy

## 2012-05-27 ENCOUNTER — Ambulatory Visit: Payer: Medicare Other | Admitting: Physical Therapy

## 2012-05-29 ENCOUNTER — Ambulatory Visit: Payer: Medicare Other | Admitting: Physical Therapy

## 2012-06-03 ENCOUNTER — Ambulatory Visit: Payer: Medicare Other | Admitting: Rehabilitation

## 2012-06-05 ENCOUNTER — Ambulatory Visit: Payer: Medicare Other | Attending: Orthopedic Surgery | Admitting: Physical Therapy

## 2012-06-05 DIAGNOSIS — IMO0001 Reserved for inherently not codable concepts without codable children: Secondary | ICD-10-CM | POA: Insufficient documentation

## 2012-06-05 DIAGNOSIS — M25569 Pain in unspecified knee: Secondary | ICD-10-CM | POA: Insufficient documentation

## 2012-06-05 DIAGNOSIS — R262 Difficulty in walking, not elsewhere classified: Secondary | ICD-10-CM | POA: Insufficient documentation

## 2012-06-05 DIAGNOSIS — M25669 Stiffness of unspecified knee, not elsewhere classified: Secondary | ICD-10-CM | POA: Insufficient documentation

## 2012-06-09 ENCOUNTER — Ambulatory Visit: Payer: Medicare Other | Admitting: Physical Therapy

## 2012-06-11 ENCOUNTER — Ambulatory Visit: Payer: Medicare Other | Admitting: Physical Therapy

## 2012-12-10 ENCOUNTER — Telehealth (HOSPITAL_COMMUNITY): Payer: Self-pay | Admitting: *Deleted

## 2012-12-10 ENCOUNTER — Ambulatory Visit (HOSPITAL_COMMUNITY)
Admission: RE | Admit: 2012-12-10 | Discharge: 2012-12-10 | Disposition: A | Discharge status: Home / Self Care | Payer: Medicare Other | Source: Ambulatory Visit | Attending: Family Medicine | Admitting: Family Medicine

## 2012-12-10 ENCOUNTER — Inpatient Hospital Stay (HOSPITAL_COMMUNITY)
Admission: RE | Admit: 2012-12-10 | Discharge: 2012-12-10 | Disposition: A | Discharge status: Home / Self Care | Payer: Medicare Other | Source: Ambulatory Visit | Attending: Physician Assistant | Admitting: Physician Assistant

## 2012-12-10 ENCOUNTER — Observation Stay (HOSPITAL_COMMUNITY)
Admission: EM | Admit: 2012-12-10 | Discharge: 2012-12-11 | Disposition: A | Discharge status: Home / Self Care | Payer: Medicare Other | Attending: Internal Medicine | Admitting: Internal Medicine

## 2012-12-10 ENCOUNTER — Encounter (HOSPITAL_COMMUNITY): Payer: Self-pay

## 2012-12-10 ENCOUNTER — Other Ambulatory Visit (HOSPITAL_COMMUNITY): Payer: Self-pay | Admitting: Family Medicine

## 2012-12-10 ENCOUNTER — Encounter (HOSPITAL_COMMUNITY): Payer: Self-pay | Admitting: *Deleted

## 2012-12-10 DIAGNOSIS — J449 Chronic obstructive pulmonary disease, unspecified: Secondary | ICD-10-CM | POA: Diagnosis present

## 2012-12-10 DIAGNOSIS — I447 Left bundle-branch block, unspecified: Secondary | ICD-10-CM | POA: Insufficient documentation

## 2012-12-10 DIAGNOSIS — S32009A Unspecified fracture of unspecified lumbar vertebra, initial encounter for closed fracture: Secondary | ICD-10-CM | POA: Insufficient documentation

## 2012-12-10 DIAGNOSIS — IMO0002 Reserved for concepts with insufficient information to code with codable children: Secondary | ICD-10-CM

## 2012-12-10 DIAGNOSIS — Z96659 Presence of unspecified artificial knee joint: Secondary | ICD-10-CM | POA: Insufficient documentation

## 2012-12-10 DIAGNOSIS — E1142 Type 2 diabetes mellitus with diabetic polyneuropathy: Secondary | ICD-10-CM | POA: Insufficient documentation

## 2012-12-10 DIAGNOSIS — E1149 Type 2 diabetes mellitus with other diabetic neurological complication: Secondary | ICD-10-CM | POA: Insufficient documentation

## 2012-12-10 DIAGNOSIS — I10 Essential (primary) hypertension: Secondary | ICD-10-CM | POA: Diagnosis present

## 2012-12-10 DIAGNOSIS — R634 Abnormal weight loss: Secondary | ICD-10-CM | POA: Diagnosis present

## 2012-12-10 DIAGNOSIS — X500XXA Overexertion from strenuous movement or load, initial encounter: Secondary | ICD-10-CM | POA: Insufficient documentation

## 2012-12-10 DIAGNOSIS — E860 Dehydration: Principal | ICD-10-CM | POA: Diagnosis present

## 2012-12-10 DIAGNOSIS — Z7902 Long term (current) use of antithrombotics/antiplatelets: Secondary | ICD-10-CM | POA: Insufficient documentation

## 2012-12-10 DIAGNOSIS — E119 Type 2 diabetes mellitus without complications: Secondary | ICD-10-CM | POA: Diagnosis present

## 2012-12-10 DIAGNOSIS — Y92009 Unspecified place in unspecified non-institutional (private) residence as the place of occurrence of the external cause: Secondary | ICD-10-CM | POA: Insufficient documentation

## 2012-12-10 DIAGNOSIS — E785 Hyperlipidemia, unspecified: Secondary | ICD-10-CM | POA: Diagnosis present

## 2012-12-10 DIAGNOSIS — J4489 Other specified chronic obstructive pulmonary disease: Secondary | ICD-10-CM | POA: Insufficient documentation

## 2012-12-10 DIAGNOSIS — R5381 Other malaise: Secondary | ICD-10-CM | POA: Insufficient documentation

## 2012-12-10 DIAGNOSIS — F17200 Nicotine dependence, unspecified, uncomplicated: Secondary | ICD-10-CM | POA: Insufficient documentation

## 2012-12-10 DIAGNOSIS — Z79899 Other long term (current) drug therapy: Secondary | ICD-10-CM | POA: Insufficient documentation

## 2012-12-10 DIAGNOSIS — G8929 Other chronic pain: Secondary | ICD-10-CM | POA: Insufficient documentation

## 2012-12-10 DIAGNOSIS — S32010A Wedge compression fracture of first lumbar vertebra, initial encounter for closed fracture: Secondary | ICD-10-CM | POA: Diagnosis present

## 2012-12-10 HISTORY — DX: Hyperlipidemia, unspecified: E78.5

## 2012-12-10 HISTORY — DX: Peripheral vascular disease, unspecified: I73.9

## 2012-12-10 HISTORY — DX: Pneumothorax, unspecified: J93.9

## 2012-12-10 LAB — PROTIME-INR: INR: 1.16 (ref 0.00–1.49)

## 2012-12-10 LAB — BASIC METABOLIC PANEL
Calcium: 9.8 mg/dL (ref 8.4–10.5)
Chloride: 99 mEq/L (ref 96–112)
Creatinine, Ser: 1.29 mg/dL (ref 0.50–1.35)
GFR calc Af Amer: 59 mL/min — ABNORMAL LOW (ref >90)

## 2012-12-10 LAB — CBC
HCT: 46.4 % (ref 39.0–52.0)
Platelets: 231 10*3/uL (ref 150–400)
RDW: 17.1 % — ABNORMAL HIGH (ref 11.5–15.5)
WBC: 9.1 10*3/uL (ref 4.0–10.5)

## 2012-12-10 LAB — APTT: aPTT: 33 seconds (ref 24–37)

## 2012-12-10 MED ORDER — ONDANSETRON HCL 4 MG/2ML IJ SOLN: 4.0000 mg | Freq: Four times a day (QID) | INTRAMUSCULAR | Status: DC | PRN

## 2012-12-10 MED ORDER — ONDANSETRON HCL 4 MG PO TABS: 4.0000 mg | ORAL_TABLET | Freq: Four times a day (QID) | ORAL | Status: DC | PRN

## 2012-12-10 MED ORDER — INSULIN ASPART 100 UNIT/ML ~~LOC~~ SOLN: 0.0000 [IU] | Freq: Three times a day (TID) | SUBCUTANEOUS | Status: DC

## 2012-12-10 MED ORDER — MORPHINE SULFATE 2 MG/ML IJ SOLN
INTRAMUSCULAR | Status: AC
  Filled 2012-12-10: qty 1

## 2012-12-10 MED ORDER — MORPHINE SULFATE 4 MG/ML IJ SOLN
INTRAMUSCULAR | Status: AC
  Administered 2012-12-10: 2 mg via INTRAVENOUS
  Filled 2012-12-10: qty 1

## 2012-12-10 MED ORDER — ZOLPIDEM TARTRATE 5 MG PO TABS: 5.0000 mg | ORAL_TABLET | Freq: Every evening | ORAL | Status: DC | PRN

## 2012-12-10 MED ORDER — MORPHINE SULFATE 2 MG/ML IJ SOLN
2.0000 mg | INTRAMUSCULAR | Status: DC | PRN
  Administered 2012-12-10 – 2012-12-11 (×3): 2 mg via INTRAVENOUS
  Filled 2012-12-10 (×2): qty 1

## 2012-12-10 MED ORDER — ALUM & MAG HYDROXIDE-SIMETH 200-200-20 MG/5ML PO SUSP: 30.0000 mL | Freq: Four times a day (QID) | ORAL | Status: DC | PRN

## 2012-12-10 MED ORDER — DEXTROSE 5 % IV SOLN
1.5000 g | Freq: Once | INTRAVENOUS | Status: DC
  Filled 2012-12-10: qty 15

## 2012-12-10 MED ORDER — DOCUSATE SODIUM 100 MG PO CAPS
100.0000 mg | ORAL_CAPSULE | Freq: Two times a day (BID) | ORAL | Status: DC
  Administered 2012-12-10 – 2012-12-11 (×2): 100 mg via ORAL
  Filled 2012-12-10 (×2): qty 1

## 2012-12-10 MED ORDER — MORPHINE SULFATE 2 MG/ML IJ SOLN: 2.0000 mg | INTRAMUSCULAR | Status: DC | PRN

## 2012-12-10 MED ORDER — HYDRALAZINE HCL 20 MG/ML IJ SOLN
10.0000 mg | Freq: Four times a day (QID) | INTRAMUSCULAR | Status: DC | PRN
  Filled 2012-12-10: qty 0.5

## 2012-12-10 MED ORDER — SODIUM CHLORIDE 0.9 % IV SOLN
INTRAVENOUS | Status: DC
  Administered 2012-12-10: 20:00:00 via INTRAVENOUS

## 2012-12-10 MED ORDER — SODIUM CHLORIDE 0.9 % IV SOLN: INTRAVENOUS | Status: DC

## 2012-12-10 MED ORDER — PANTOPRAZOLE SODIUM 40 MG PO TBEC
40.0000 mg | DELAYED_RELEASE_TABLET | Freq: Every day | ORAL | Status: DC
  Administered 2012-12-10 – 2012-12-11 (×2): 40 mg via ORAL
  Filled 2012-12-10 (×2): qty 1

## 2012-12-10 NOTE — H&P (Signed)
Shane Pope is an 79 y.o. male.   Chief Complaint: acute symptomatic L1 compression fracture HPI: patient heard pop in his lower spine 11/10/12 while lifting a TV. He has poorly controlled pain affecting his ambulation. He has lost approximately 20 lbs and has profound anorexia and nausea/vomiting secondary to pain. Denies radicular symptoms to the legs however has profound weakness due to pain. Denies incontinence of the bladder or bowels. MRI reveals significant compression of the L1 vertebrae with retropulsion amendable to percutaneous vertebroplasty.   Past Medical History  Diagnosis Date  . Hypertension   . Diabetes mellitus   . Anemia   . Blood transfusion     hx of   . GERD (gastroesophageal reflux disease)   . Arthritis   . 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 Fracture     hx of right wrist fracture   . Fracture, ribs     hx of on right side   . Pneumothorax on right   . PVD (peripheral vascular disease)   . Hyperlipidemia     Past Surgical History  Procedure Date  . Hernia repair     right inguinal hernia repair   . Other surgical history     undescended testicle surgery  . Cholecystectomy   . Hemorrhoid surgery   . Eye surgery     bilateral cataract surgery  . Knee arthroscopy     right knee   . Appendectomy   . Total knee arthroplasty 03/24/2012    Procedure: TOTAL KNEE ARTHROPLASTY;  Surgeon: Loanne Drilling, MD;  Location: [facility];  Service: Orthopedics;  Laterality: Right;    No family history on file. Patient is divorced, lives alone. Has a daughter who is an Charity fundraiser who lives close by.   Social History:  reports that he has been smoking Cigars.  He has never used smokeless tobacco. He reports that he does not drink alcohol or use illicit drugs.  Allergies: No Known Allergies    Medication List     As of 12/10/2012  1:33 PM    ASK your doctor about these medications         acetaminophen 325 MG tablet   Commonly known as: TYLENOL   Take 2 tablets (650  mg total) by mouth every 6 (six) hours as needed (or Fever >/= 101).      atorvastatin 40 MG tablet   Commonly known as: LIPITOR   Take 40 mg by mouth daily with breakfast.      fenofibrate 160 MG tablet   Take 160 mg by mouth daily with breakfast.      ferrous sulfate 325 (65 FE) MG tablet   Take 325 mg by mouth daily with breakfast.      glipiZIDE 10 MG tablet   Commonly known as: GLUCOTROL   Take 10 mg by mouth 2 (two) times daily before a meal. Pt takes 5 mg      lisinopril-hydrochlorothiazide 20-25 MG per tablet   Commonly known as: PRINZIDE,ZESTORETIC   Take 1 tablet by mouth daily with breakfast.      metFORMIN 1000 MG tablet   Commonly known as: GLUCOPHAGE   Take 1,000 mg by mouth 2 (two) times daily with a meal.      omeprazole 40 MG capsule   Commonly known as: PRILOSEC   Take 40 mg by mouth daily.      rivaroxaban 10 MG Tabs tablet   Commonly known as:  XARELTO   Take 1 tablet (10 mg total) by mouth daily with breakfast. Take for two and a half more weeks, then discontinue Xarelto.      VICTOZA 18 MG/3ML Soln injection   Generic drug: Liraglutide   Inject into the skin every evening.        Results for Shane Pope (MRN [id number]) as of 12/10/2012 14:18  Ref. Range 12/10/2012 13:15  Sodium Latest Range: 135-145 mEq/L 135  Potassium Latest Range: 3.5-5.1 mEq/L 4.6  Chloride Latest Range: 96-112 mEq/L 99  CO2 Latest Range: 19-32 mEq/L 21  BUN Latest Range: 6-23 mg/dL 60 (H)  Creatinine Latest Range: 0.50-1.35 mg/dL 7.82  Calcium Latest Range: 8.4-10.5 mg/dL 9.8  GFR calc non Af Amer Latest Range: >90 mL/min 51 (L)  GFR calc Af Amer Latest Range: >90 mL/min 59 (L)  Glucose Latest Range: 70-99 mg/dL 956 (H)  WBC Latest Range: 4.0-10.5 K/uL 9.1  RBC Latest Range: 4.22-5.81 MIL/uL 5.77  Hemoglobin Latest Range: 13.0-17.0 g/dL 21.3  HCT Latest Range: 39.0-52.0 % 46.4  MCV Latest Range: 78.0-100.0 fL 80.4  MCH Latest Range: 26.0-34.0 pg 26.9  MCHC Latest Range:  30.0-36.0 g/dL 08.6  RDW Latest Range: 11.5-15.5 % 17.1 (H)  Platelets Latest Range: 150-400 K/uL 231  Prothrombin Time Latest Range: 11.6-15.2 seconds 14.6  INR Latest Range: 0.00-1.49  1.16  aPTT Latest Range: 24-37 seconds 33    Review of Systems  Constitutional: Positive for weight loss and malaise/fatigue. Negative for fever and chills.  Respiratory: Positive for shortness of breath. Negative for cough, hemoptysis and sputum production.        SOB secondary to pain   Cardiovascular: Negative for chest pain and palpitations.  Gastrointestinal: Positive for nausea and vomiting. Negative for abdominal pain, diarrhea, constipation and blood in stool.  Genitourinary: Negative.   Musculoskeletal: Positive for back pain, joint pain and falls.  Skin: Negative.   Neurological: Positive for focal weakness and weakness. Negative for dizziness, sensory change, seizures and loss of consciousness.  Endo/Heme/Allergies: Negative.   Psychiatric/Behavioral: Negative for depression and memory loss. The patient is not nervous/anxious and does not have insomnia.     Physical Exam  Constitutional: He is oriented to person, place, and time. He appears well-developed. He appears distressed.  HENT:  Head: Normocephalic and atraumatic.       Dry mucous membranes  Eyes: Pupils are equal, round, and reactive to light.  Neck: Normal range of motion.  Cardiovascular: Normal rate and regular rhythm.  Exam reveals no gallop and no friction rub.   No murmur heard.      Tachy   Respiratory: Effort normal and breath sounds normal. No respiratory distress. He has no wheezes. He has no rales. He exhibits no tenderness.  GI: Soft. Bowel sounds are normal. He exhibits no distension. There is no tenderness.  Musculoskeletal: He exhibits tenderness.       Limited ability to ambulate or change position secondary to pain   Neurological: He is alert and oriented to person, place, and time.  Skin: Skin is warm and  dry. No erythema. There is pallor.  Psychiatric: He has a normal mood and affect. His behavior is normal. Judgment and thought content normal.     Assessment/Plan Patient met in formal consultation emergently secondary to profound pain and fracture instability. He has 10/10 pain and is uncontrolled on po pain meds with profound weight loss in last few weeks. Procedure details, risks and benefits discussed with patient and all his questions  answered to his satisfaction and understanding. Written consent obtained. Will order stat labs, IVF in attempt to proceed with VP today of L1 for pain control and fracture stabilization. Daughter who is an Charity fundraiser with Dr. Marina Goodell was contacted by phone and updated on her father's status.   CAMPBELL,PAMELA D 12/10/2012, 1:33 PM  Addendum : made aware patient took xarelto last pm - policy is for 24 hours to be held prior to procedure which would postpone until tomorrow am. [employer] representative to call back with authorization for possible 23 hours admit prior to VP in am - then likely d/c after procedure if without complication.

## 2012-12-10 NOTE — ED Notes (Signed)
Pt was to have vertebral plasty as emergent, but insurance company would not allow it, but patient is dehydrated, weakness to LE, and has had 20lb weight loss in 2 weeks

## 2012-12-10 NOTE — Progress Notes (Signed)
[employer] has been called in attempt to obtain emergent authorization to perform this L1 VP to stabilize this fracture and provide comfort for patient as their usual protocol is 7-14 days to authorize.  Due to patient's safety as he lives alone and has profound LE weakness worrisome for fall, dehydration and weight loss with fatigue- he will likely need 23 hours observation admission to allow 24 hours post xarelto dose and have L1 VP with biopsy performed in the am with Dr. Corliss Skains in IR. If procedure is without complication - patient may be discharged 3 hours post procedure. His daughter is an Charity fundraiser with Dr. Marina Goodell and has been updated. [employer] representative to call back with authorization status asap.

## 2012-12-10 NOTE — ED Notes (Signed)
Unit called to give report, receiving RN will call back to get report.

## 2012-12-10 NOTE — H&P (Signed)
Physician Admission History and Physical     ZOX:WRUEAVW Buren Kos    Chief Complaint:  Presents from interventional radiology because unable to get the [facility] approval for an urgent L1 vertebraplasty   HPI: Shane Pope is an 79 y.o. male.  The patient is present w/ his daughter. They are both very concerned about his acute medical condition. The patient is a very poor historian. Per his report he was lifting a heavy TV a few weeks ago and noted a popping in his back. He called the office and was treated w/ naproxen, flexeril, and ultram, all w/o relief. He then presented to [facility] for w/u a few days ago. MRI was obtained and he was noted to have a compression fracture in his L1 vertebrae which is pressing on the spinal cord. This image is not available at this time. He was then referred to Dr Nigel Sloop in IR for a vertebraplasty today. He arrived at the IR office today but was unable to have the procedure done b/c of insurance clearance issues. He was then sent to the ED for admission in hopes that he may be able to have the procedure tomorrow.  He states that at this time he is not in pain.    Per review of our medical record, it appears that he had an L1 compression fracture. He never suffered from the red flags of saddle parasthesia anesthesia, bowel or bladder changes. He was experiencing more bilateral lower extremity weakness, difficulty getting in and out of bed and chair and increasing difficulty getting from a lying to a sitting position. He has also noted weight loss from loss of appetite.  Per his report, he has not taken any medications in the past 2 weeks, which is concerning b/c he has an extensive medication list. He lives at home alone. There was also some confusion w/ the IR suit on whether he was taking xarelto, but this appears to have been perioperative ppx treatment around a knee surgery last year, and he no longer appears to be on this medication.   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) Diverticulosis Carpal Tunnel Syndrome Pulmonary Nodule-2000 Thyroid Nodule (-) US-04/2009 Right rib fractures, multiple-->pneumothorax requiring chest tube (4/11) due to traumatic fall down steps Surgical History   Right knee-2005 Wrist/Rib fracture Chole Hemorrhoidectomy Hernia repair T&A Left cataract removal undistended testical (age 56) Right Knee Replacement (5/13) Social History  "Happily" Divorced 2 daughters- healthy, 1 with substance abuse issues; 1 is Charity fundraiser at [employer] (Dr.Perry) 2 grandchildren Building services engineer business-Retired H/O tobacco use 2-3 ppd x 35 years-quit in 1991- smokes cigars No H/O alcohol use  Complete Medication List: 1)  Miacalcin 200 Unit/act Nasal Soln (Calcitonin (salmon)) .... Alternate nostril daily 2)  Medrol (pak) 4 Mg Tabs (Methylprednisolone) .... Ss directed over 6 day taper 3)  Tramadol Hcl 50 Mg Tabs (Tramadol hcl) .Marland Kitchen.. 1 q8hrs prn pain 4)  Cyclobenzaprine Hcl 10 Mg Tabs (Cyclobenzaprine hcl) .Marland Kitchen.. 1 po every 8 hours prn for muscle spasm 5)  Naproxen 500 Mg Tabs (Naproxen) .Marland Kitchen.. 1 po bid 6)  Crestor 10 Mg Tabs (Rosuvastatin calcium) .... Take one half tablet once daily 7)  Aspirin 81 Mg Ec Tab (Aspirin) .... Take one (1) tablet by mouth daily 8)  Glucosamine-chondroitin Tabs (Glucosamine-chondroit-vit c-mn) .... Take (  1) tablet po daily 9)  Glipizide 10 Mg Tabs (Glipizide) .... Take 1/2 tablet by mouth twice a day 10)  Lasix 40 Mg Tabs (Furosemide) .... Take one tablet daily as needed for swelling 11)  Lisinopril-hydrochlorothiazide 20-25 Mg Tabs (Lisinopril-hydrochlorothiazide) .... Take one tablet by mouth every day 12)  Lovaza 1 Gm Caps (Omega-3-acid ethyl esters) .... Take one tablet by  mouth two times a day 13)  Metformin Hcl 1000 Mg Tabs (Metformin hcl) .... Take one tablet by mouth twice a day 14)  Voltaren 1 % Gel (Diclofenac sodium) .... Use prn 15)  Meloxicam 15 Mg Tabs (Meloxicam) .... Take one tablet by mouth every day 16)  Novofine 32g X 6 Mm Misc (Insulin pen needle) .... Use with victoza daily 17)  Nu-iron 150 Mg Caps (Polysaccharide iron complex) .... Take one tablet by mouth every day 18)  Onetouch Ultra Blue Strp (Glucose blood) .... Use one strip to check blood sugar twice daily dx:  250.7 uses insulin 19)  Onetouch Ultrasoft Lancets Misc (Lancets) .... Use one lancet to check blood sugar twice daily dx:  250.7 uses insulin 20)  Ketoconazole 2 % Crea (Ketoconazole) .... Aaa bid prn 21)  Amlodipine Besylate 5 Mg Tabs (Amlodipine besylate) .Marland Kitchen.. 1 pill daily  Allergies :  Actos and Niacin  Physical Exam: Filed Vitals:   12/10/12 1900 12/10/12 1935 12/10/12 1940 12/10/12 1952  BP: 144/70 144/70 150/77 129/71  Pulse: 72 72 81 95  Temp: 97.8 F (36.6 C)     TempSrc: Oral     Resp: 16     SpO2: 90%      General: elderly male in NAD  HEENT: dry MM, trachea midline, EOMI, PERRL  Cardio: RRR, no MRG  Pulm: no wheezes or rales. Good effort. Breath sounds present diffusely  Abd: No TTP. + BS. No masses  Ext: No edema. 2+ pulses  MSK: 5/5 strength in UE and LE  Neuro: AAOx4. Spine: minimal TTP in the lumbar region    Labs on Admission:   Basename 12/10/12 1315  NA 135  K 4.6  CL 99  CO2 21  GLUCOSE 123*  BUN 60*  CREATININE 1.29  CALCIUM 9.8  MG --  PHOS --   No results found for this basename: AST:2,ALT:2,ALKPHOS:2,BILITOT:2,PROT:2,ALBUMIN:2 in the last 72 hours No results found for this basename: LIPASE:2,AMYLASE:2 in the last 72 hours  Basename 12/10/12 1315  WBC 9.1  NEUTROABS --  HGB 15.5  HCT 46.4  MCV 80.4  PLT 231   No results found for this basename: CKTOTAL:3,CKMB:3,CKMBINDEX:3,TROPONINI:3 in the last 72 hours Lab Results   Component Value Date   INR 1.16 12/10/2012   INR 1.13 03/13/2012   No results found for this basename: TSH,T4TOTAL,FREET3,T3FREE,THYROIDAB in the last 72 hours No results found for this basename: VITAMINB12:2,FOLATE:2,FERRITIN:2,TIBC:2,IRON:2,RETICCTPCT:2 in the last 72 hours  Radiological Exams on Admission: No results found. Orders placed during the hospital encounter of 03/13/12  . EKG 12-LEAD  . EKG 12-LEAD    Assessment/Plan Principal Problem:  *Compression fracture of L1 lumbar vertebra  - admit to obs   - morphine prn severe pain   - zofran prn nausea   - contact IR in the morning about proceeding w/ the vertebraplasty   - NS at 100cc/hr for hydration  - NPO after midnight   - no blood thinners, routine labs in AM   - SCDs for ppx   - pt refused to allow any further imaging to monitor for cord compression,  so unable to tell if any progression of cord impingement. Given no signs of weakness on exam, and reported stability over the past month, no urgent need for IV steroids at this time.    Given he reportedly was not taking any medication and his BP and glucose were both normal, I am holding his home meds at this time. For HTN I have placed hydralazine prn and for his DM I have placed SSI orders.   Haruko Mersch 12/10/2012, 8:06 PM

## 2012-12-10 NOTE — ED Provider Notes (Signed)
History     CSN: [id number]  Arrival date & time 12/10/12  1658   First MD Initiated Contact with Patient 12/10/12 1747      Chief Complaint  Patient presents with  . Weakness  . dehydrated     (Consider location/radiation/quality/duration/timing/severity/associated sxs/prior treatment) HPI Comments: Patient presents from interventional radiology where he was apparently supposed to have a vertebroplasty today. He has a lumbar compression fracture for the past several weeks after lifting a television. There is an insurance problem and procedure was not done today. He was sent to the ED for admission as the plan is to do the procedure tomorrow. Patient states he is dehydrated and feels very weak. He is a 20 pound weight loss in the past 2 weeks. Denies any fevers, numbness, tingling, bowel or bladder incontinence, chest pain or shortness of breath.  The history is provided by the patient.    Past Medical History  Diagnosis Date  . Hypertension   . Diabetes mellitus   . Anemia   . Blood transfusion     hx of   . GERD (gastroesophageal reflux disease)   . Arthritis   . 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 Fracture     hx of right wrist fracture   . Fracture, ribs     hx of on right side   . Pneumothorax on right   . PVD (peripheral vascular disease)   . Hyperlipidemia     Past Surgical History  Procedure Date  . Hernia repair     right inguinal hernia repair   . Other surgical history     undescended testicle surgery  . Cholecystectomy   . Hemorrhoid surgery   . Eye surgery     bilateral cataract surgery  . Knee arthroscopy     right knee   . Appendectomy   . Total knee arthroplasty 03/24/2012    Procedure: TOTAL KNEE ARTHROPLASTY;  Surgeon: Loanne Drilling, MD;  Location: [facility];  Service: Orthopedics;  Laterality: Right;    No family history on file.  History  Substance Use Topics  . Smoking status: Current Every Day Smoker -- 60 years    Types: Cigars   . Smokeless tobacco: Never Used  . Alcohol Use: No      Review of Systems  Constitutional: Negative for activity change and appetite change.  HENT: Negative for congestion and rhinorrhea.   Respiratory: Negative for cough, chest tightness and shortness of breath.   Cardiovascular: Negative for chest pain.  Gastrointestinal: Negative for nausea, vomiting and abdominal pain.  Genitourinary: Negative for dysuria and hematuria.  Musculoskeletal: Positive for back pain.  Skin: Negative for rash.  Neurological: Positive for weakness. Negative for dizziness and headaches.  A complete 10 system review of systems was obtained and all systems are negative except as noted in the HPI and PMH.    Allergies  Review of patient's allergies indicates no known allergies.  Home Medications   No current outpatient prescriptions on file.  BP 139/74  Pulse 70  Temp 97.4 F (36.3 C) (Oral)  Resp 18  Ht 6\' 1"  (1.854 m)  Wt 165 lb (74.844 kg)  BMI 21.77 kg/m2  SpO2 94%  Physical Exam  Constitutional: He is oriented to person, place, and time. He appears well-developed. No distress.  HENT:  Head: Normocephalic and atraumatic.  Mouth/Throat: Oropharynx is clear and moist. No oropharyngeal exudate.  Eyes: Conjunctivae normal and EOM are  normal. Pupils are equal, round, and reactive to light.  Neck: Normal range of motion. Neck supple.  Cardiovascular: Normal rate, regular rhythm and normal heart sounds.   No murmur heard. Pulmonary/Chest: Effort normal and breath sounds normal. No respiratory distress.  Abdominal: Soft. There is no tenderness. There is no rebound and no guarding.  Musculoskeletal: Normal range of motion. He exhibits tenderness. He exhibits no edema.       TTP in midline diffusely in lumbar spine.  5/5 strength in bilateral LE.  Neurological: He is alert and oriented to person, place, and time. No cranial nerve deficit. He exhibits normal muscle tone. Coordination normal.   Skin: Skin is warm.    ED Course  Procedures (including critical care time)   Labs Reviewed  URINALYSIS, ROUTINE W REFLEX MICROSCOPIC  BASIC METABOLIC PANEL  CBC  PROTIME-INR   No results found.   1. Compression fracture       MDM  Acute and chronic back pain secondary to lumbar compression fracture. No focal neurological deficits. Patient complains of pain and was sent to the ED from IR due to concerns of "dehydration". No vomiting. No fever.  Family members frustrated that they have been waiting all day. They were told they would be directly admitted to Dr. Clelia Croft service. I spoke with Dr. Link Snuffer of [facility] and this is not true.  They were sent to the ED for evaluation of dehydration.  Labs from earlier today reviewed. Cr at baseline.  Patient and daughter state MRI was done as outpatient and disc was given to IR.  No images available. Refusing further imaging at this time. No neurological red flags or signs or cauda equina syndrome. No incontinence.  D/w Dr. Link Snuffer., He will evaluate patient.     Glynn Octave, MD 12/10/12 [phone]

## 2012-12-11 ENCOUNTER — Observation Stay (HOSPITAL_COMMUNITY): Payer: Medicare Other

## 2012-12-11 DIAGNOSIS — J449 Chronic obstructive pulmonary disease, unspecified: Secondary | ICD-10-CM | POA: Diagnosis present

## 2012-12-11 DIAGNOSIS — E860 Dehydration: Secondary | ICD-10-CM | POA: Diagnosis present

## 2012-12-11 DIAGNOSIS — I10 Essential (primary) hypertension: Secondary | ICD-10-CM | POA: Diagnosis present

## 2012-12-11 DIAGNOSIS — R634 Abnormal weight loss: Secondary | ICD-10-CM | POA: Diagnosis present

## 2012-12-11 LAB — URINALYSIS, ROUTINE W REFLEX MICROSCOPIC
Ketones, ur: NEGATIVE mg/dL
Leukocytes, UA: NEGATIVE
Nitrite: NEGATIVE
Protein, ur: NEGATIVE mg/dL
Urobilinogen, UA: 1 mg/dL (ref 0.0–1.0)

## 2012-12-11 LAB — CBC
HCT: 43.1 % (ref 39.0–52.0)
Hemoglobin: 14.3 g/dL (ref 13.0–17.0)
MCH: 26.9 pg (ref 26.0–34.0)
MCHC: 33.2 g/dL (ref 30.0–36.0)
MCV: 81.2 fL (ref 78.0–100.0)

## 2012-12-11 LAB — BASIC METABOLIC PANEL
BUN: 48 mg/dL — ABNORMAL HIGH (ref 6–23)
CO2: 24 mEq/L (ref 19–32)
Chloride: 105 mEq/L (ref 96–112)
Creatinine, Ser: 1.11 mg/dL (ref 0.50–1.35)
Glucose, Bld: 98 mg/dL (ref 70–99)
Potassium: 4.5 mEq/L (ref 3.5–5.1)

## 2012-12-11 LAB — SURGICAL PCR SCREEN: Staphylococcus aureus: NEGATIVE

## 2012-12-11 LAB — PROTIME-INR: INR: 1.24 (ref 0.00–1.49)

## 2012-12-11 MED ORDER — HYDROCODONE-ACETAMINOPHEN 5-325 MG PO TABS: 1.0000 | ORAL_TABLET | ORAL | Status: DC | PRN

## 2012-12-11 MED ORDER — TOBRAMYCIN SULFATE 1.2 G IJ SOLR
INTRAMUSCULAR | Status: AC
  Filled 2012-12-11: qty 1.2

## 2012-12-11 MED ORDER — HYDROCODONE-ACETAMINOPHEN 5-325 MG PO TABS
1.0000 | ORAL_TABLET | ORAL | Status: DC | PRN
  Administered 2012-12-11 (×2): 2 via ORAL
  Filled 2012-12-11 (×2): qty 2

## 2012-12-11 MED ORDER — SODIUM CHLORIDE 0.9 % IV SOLN
INTRAVENOUS | Status: AC
  Administered 2012-12-11: 14:00:00 via INTRAVENOUS

## 2012-12-11 MED ORDER — DSS 100 MG PO CAPS: 100.0000 mg | ORAL_CAPSULE | Freq: Two times a day (BID) | ORAL | Status: DC

## 2012-12-11 MED ORDER — HYDROMORPHONE HCL PF 1 MG/ML IJ SOLN
INTRAMUSCULAR | Status: AC
  Filled 2012-12-11: qty 2

## 2012-12-11 MED ORDER — IOHEXOL 300 MG/ML  SOLN
50.0000 mL | Freq: Once | INTRAMUSCULAR | Status: AC | PRN
  Administered 2012-12-11: 5 mL via INTRAVENOUS

## 2012-12-11 MED ORDER — DEXTROSE 5 % IV SOLN
1.5000 g | Freq: Once | INTRAVENOUS | Status: AC
  Administered 2012-12-11: 1.5 g via INTRAVENOUS

## 2012-12-11 MED ORDER — FENTANYL CITRATE 0.05 MG/ML IJ SOLN
INTRAMUSCULAR | Status: AC
  Filled 2012-12-11: qty 4

## 2012-12-11 MED ORDER — MIDAZOLAM HCL 2 MG/2ML IJ SOLN
INTRAMUSCULAR | Status: AC | PRN
  Administered 2012-12-11: 1 mg via INTRAVENOUS

## 2012-12-11 MED ORDER — FENTANYL CITRATE 0.05 MG/ML IJ SOLN
INTRAMUSCULAR | Status: AC | PRN
  Administered 2012-12-11: 25 ug via INTRAVENOUS

## 2012-12-11 MED ORDER — MIDAZOLAM HCL 2 MG/2ML IJ SOLN
INTRAMUSCULAR | Status: AC
  Filled 2012-12-11: qty 4

## 2012-12-11 NOTE — Progress Notes (Signed)
Subjective: Pain is tolerable with MSO4 but has been "the worst pain in my life" for the past 3-4 weeks since injury.  No weakness in legs at this point.  Has lost 20lbs in past 3 weeks due to poor po intake (50lbs in the past year).  No other focal complaints.   Objective: Vital signs in last 24 hours: Temp:  [97.4 F (36.3 C)-97.8 F (36.6 C)] 97.5 F (36.4 C) (02/06 0600) Pulse Rate:  [70-95] 77  (02/06 0600) Resp:  [16-18] 16  (02/06 0600) BP: (129-150)/(55-86) 139/66 mmHg (02/06 0600) SpO2:  [90 %-99 %] 96 % (02/06 0600) Weight:  [74.844 kg (165 lb)] 74.844 kg (165 lb) (02/05 2141) Weight change:  Last BM Date: 12/08/12  CBG (last 3)   Basename 12/11/12 0649 12/10/12 2259  GLUCAP 89 117*    Intake/Output from previous day:   Intake/Output this shift:    General appearance: alert and appears fairly comfortable when lying still, grimacing when changing position Eyes: no scleral icterus Throat: oropharynx moist without erythema Resp: clear to auscultation bilaterally Cardio: regular rate and rhythm GI: soft, non-tender; bowel sounds normal; no masses,  no organomegaly Extremities: no clubbing, cyanosis or edema Neurologic: tenderness over lumbar spine- strength 5/5 in both legs; DTRs 1+ symmetric   Lab Results:  Basename 12/11/12 0535 12/10/12 1315  NA 139 135  K 4.5 4.6  CL 105 99  CO2 24 21  GLUCOSE 98 123*  BUN 48* 60*  CREATININE 1.11 1.29  CALCIUM 9.2 9.8  MG -- --  PHOS -- --   No results found for this basename: AST:2,ALT:2,ALKPHOS:2,BILITOT:2,PROT:2,ALBUMIN:2 in the last 72 hours  Basename 12/11/12 0535 12/10/12 1315  WBC 6.7 9.1  NEUTROABS -- --  HGB 14.3 15.5  HCT 43.1 46.4  MCV 81.2 80.4  PLT 218 231   Lab Results  Component Value Date   INR 1.24 12/11/2012   INR 1.16 12/10/2012   INR 1.13 03/13/2012    Studies/Results: No results found.   Medications: Scheduled:   . docusate sodium  100 mg Oral BID  . insulin aspart  0-15 Units  Subcutaneous TID WC  . pantoprazole  40 mg Oral Daily   Continuous:   . sodium chloride 100 mL/hr at 12/10/12 2002    Assessment/Plan: Principal Problem: 1. *Compression fracture of L1 lumbar vertebra- Has failed with outpatient management including NSAIDs, muscle relaxants, steroids, and narcotics over past 3-4 weeks.  Awaiting insurance approval for vertebroplasty.  Will discuss with IR- request bone biopsy to rule out pathologic fracture (likely mechanical related to lifting heavy TV).  Active Problems: 2. Dehydration- BUN elevated on admission consistent with volume depletion.  Improved with IV fluids. 3. Weight loss- has helped with DM2 and BP control.  No other symptoms to suggest underlying malignancy. 4. DIABETES MELLITUS, TYPE II- excellent control off of medications in setting of weight loss.  Will continue to hold meds and monitor. 5. HYPERLIPIDEMIA- will resume statin soon once pain is controlled. 6. Hypertension- BP meds on hold.  Will monitor and resume if BP increases. 7. COPD (chronic obstructive pulmonary disease)- no issues currently. 8. Disposition- anticipate discharge today after procedure if pain is improved.  He declines PT at this point.  Will follow-up within 1 week.    LOS: 1 day   Verlie Hellenbrand,W DOUGLAS 12/11/2012, 7:35 AM

## 2012-12-11 NOTE — Procedures (Signed)
S/P L1 VP with biopsy 

## 2012-12-12 ENCOUNTER — Telehealth (HOSPITAL_COMMUNITY): Payer: Self-pay | Admitting: *Deleted

## 2012-12-12 NOTE — Care Management Note (Signed)
    Page 1 of 1   12/12/2012     8:00:45 AM   CARE MANAGEMENT NOTE 12/12/2012  Patient:  RICHAR, DUNKLEE   Account Number:  [id number]  Date Initiated:  12/11/2012  Documentation initiated by:  [facility]  Subjective/Objective Assessment:   admitted with severe back pain, compression fracture L1     Action/Plan:   plan vertebraplasty 12/11/12   Anticipated DC Date:  12/12/2012   Anticipated DC Plan:  HOME/SELF CARE      DC Planning Services  CM consult      Choice offered to / List presented to:             Status of service:  Completed, signed off Medicare Important Message given?   (If response is "NO", the following Medicare IM given date fields will be blank) Date Medicare IM given:   Date Additional Medicare IM given:    Discharge Disposition:  HOME/SELF CARE  Per UR Regulation:  Reviewed for med. necessity/level of care/duration of stay  If discussed at Long Length of Stay Meetings, dates discussed:    Comments:

## 2012-12-14 NOTE — Discharge Summary (Signed)
DISCHARGE SUMMARY  Shane Pope  MR#: [id number]  DOB:09/14/1931  Date of Admission: 12/10/2012 Date of Discharge: 12/11/2012  Attending Physician: Shane Pope  Patient's PCP: Shane Pope  Consults:  Interventional Radiology (Dr. Nigel Pope)  Discharge Diagnoses: Principal Problem:   Compression fracture of L1 lumbar vertebra Active Problems:   DIABETES MELLITUS, TYPE II   HYPERLIPIDEMIA   Dehydration   Weight loss   Hypertension   COPD (chronic obstructive pulmonary disease)  Past Medical History :  DM2 with nephropathy, neuropathy, vascular complications  HTN  Hyperlipidemia  LBBB  PVD- ABIs- 0.73 Right, 0.61Left (10/10)  Carotid Stenosis- 50-69% (10/10)  COPD, mild (PFTs 9/10)  Diverticulosis  Carpal Tunnel Syndrome  Pulmonary Nodule-2000  Thyroid Nodule (-) US-04/2009  Right rib fractures, multiple-->pneumothorax requiring chest tube (4/11) due to traumatic fall down steps  Surgical History  Right knee-2005  Wrist/Rib fracture  Chole  Hemorrhoidectomy  Hernia repair  T&A  Left cataract removal  undistended testical (age 40)  Right Knee Replacement (5/13)  Discharge Medications:   Medication List    STOP taking these medications       atorvastatin 40 MG tablet  Commonly known as:  LIPITOR     fenofibrate 160 MG tablet     ferrous sulfate 325 (65 FE) MG tablet     glipiZIDE 10 MG tablet  Commonly known as:  GLUCOTROL     lisinopril-hydrochlorothiazide 20-25 MG per tablet  Commonly known as:  PRINZIDE,ZESTORETIC     metFORMIN 1000 MG tablet  Commonly known as:  GLUCOPHAGE     rivaroxaban 10 MG Tabs tablet  Commonly known as:  XARELTO     rosuvastatin 5 MG tablet  Commonly known as:  CRESTOR     VICTOZA 18 MG/3ML Soln injection  Generic drug:  Liraglutide      TAKE these medications       DSS 100 MG Caps  Take 100 mg by mouth 2 (two) times daily.     HYDROcodone-acetaminophen 5-325 MG per tablet  Commonly known as:   NORCO/VICODIN  Take 1-2 tablets by mouth every 4 (four) hours as needed.        Hospital Procedures: L1 vertebroplasty  History of Present Illness: (From Dr. Alphonsus Pope H&P): Shane Pope is an 79 y.o. male. The patient is present w/ his daughter. They are both very concerned about his acute medical condition. The patient is a very poor historian. Per his report he was lifting a heavy TV a few weeks ago and noted a popping in his back. He called the office and was treated w/ naproxen, flexeril, and ultram, all w/o relief. He then presented to [facility] for w/u a few days ago. MRI was obtained and he was noted to have a compression fracture in his L1 vertebrae which is pressing on the spinal cord. This image is not available at this time. He was then referred to Dr Shane Pope in IR for a vertebraplasty today. He arrived at the IR office today but was unable to have the procedure done b/c of insurance clearance issues. He was then sent to the ED for admission in hopes that he may be able to have the procedure tomorrow. He states that at this time he is not in pain.  Per review of our medical record, it appears that he had an L1 compression fracture. He never suffered from the red flags of saddle parasthesia anesthesia, bowel or bladder changes. He was experiencing more bilateral lower extremity weakness, difficulty getting in and  out of bed and chair and increasing difficulty getting from a lying to a sitting position. He has also noted weight loss from loss of appetite.  Per his report, he has not taken any medications in the past 2 weeks, which is concerning b/c he has an extensive medication list. He lives at home alone. There was also some confusion w/ the IR suit on whether he was taking xarelto, but this appears to have been perioperative ppx treatment around a knee surgery last year, and he no longer appears to be on this medication.    Hospital Course: [name] was admitted under observation status  to a medical bed for pain management. He had failed outpatient management with oral NSAIDs, muscle relaxants, steroids, and narcotics. Unfortunately, his outpatient vertebroplasty was delayed while awaiting insurance approval. The patient underwent an L1 vertebroplasty with improvement in his pain. He was instructed to light flat for 3 hours following the procedure. After this period, his pain was controlled and he was stable for discharge home.  Of note, his antihypertensive and diabetic medications were held throughout his hospitalization. In the setting of his recent weight loss due to decreased by mouth intake with his pain he has lost over 20 pounds. His blood pressure and sugar remained under excellent control despite holding his medications. This will be followed closely as an outpatient and medications will be restarted as needed  Day of Discharge Exam BP 115/47  Pulse 68  Temp(Src) 97.6 F (36.4 C) (Oral)  Resp 20  Ht 6\' 1"  (1.854 m)  Wt 74.844 kg (165 lb)  BMI 21.77 kg/m2  SpO2 96%  Physical Exam: General appearance: alert and appears fairly comfortable when lying still, grimacing when changing position  Eyes: no scleral icterus  Throat: oropharynx moist without erythema  Resp: clear to auscultation bilaterally  Cardio: regular rate and rhythm  GI: soft, non-tender; bowel sounds normal; no masses, no organomegaly  Extremities: no clubbing, cyanosis or edema  Neurologic: tenderness over lumbar spine- strength 5/5 in both legs; DTRs 1+ symmetric   Discharge Labs: [place]  12/11/12 0535  12/10/12 1315   NA  139  135   K  4.5  4.6   CL  105  99   CO2  24  21   GLUCOSE  98  123*   BUN  48*  60*   CREATININE  1.11  1.29   CALCIUM  9.2  9.8   MG  --  --   PHOS  --  --     Basename  12/11/12 0535  12/10/12 1315   WBC  6.7  9.1   NEUTROABS  --  --   HGB  14.3  15.5   HCT  43.1  46.4   MCV  81.2  80.4   PLT  218  231     Discharge instructions:     Discharge Orders    Future Orders Complete By Expires     Diet - low sodium heart healthy  As directed     Discharge instructions  As directed     Comments:      1.No stooping ,bending lifting more than 10 lbs for 2 weeks 2.Use walker for 2 weeks  3. RTC in 2 weeks    Increase activity slowly  As directed        Disposition: to home  Follow-up Appts: Follow-up with Dr. Clelia Croft at [facility] in 1 week.  Office will call to schedule appointment.  Condition on Discharge: Stable/improved  Tests Needing Follow-up: None  Signed: Elvena Oyer,W DOUGLAS 12/14/2012, 10:41 AM

## 2012-12-15 ENCOUNTER — Other Ambulatory Visit (HOSPITAL_COMMUNITY): Payer: Self-pay | Admitting: Interventional Radiology

## 2012-12-15 DIAGNOSIS — IMO0002 Reserved for concepts with insufficient information to code with codable children: Secondary | ICD-10-CM

## 2012-12-25 ENCOUNTER — Ambulatory Visit (HOSPITAL_COMMUNITY)
Admission: RE | Admit: 2012-12-25 | Discharge: 2012-12-25 | Disposition: A | Discharge status: Home / Self Care | Payer: Medicare Other | Source: Ambulatory Visit | Attending: Interventional Radiology | Admitting: Interventional Radiology

## 2012-12-25 DIAGNOSIS — IMO0002 Reserved for concepts with insufficient information to code with codable children: Secondary | ICD-10-CM

## 2013-04-14 ENCOUNTER — Other Ambulatory Visit: Payer: Self-pay | Admitting: Neurological Surgery

## 2013-04-14 DIAGNOSIS — M549 Dorsalgia, unspecified: Secondary | ICD-10-CM

## 2013-04-18 ENCOUNTER — Ambulatory Visit
Admission: RE | Admit: 2013-04-18 | Discharge: 2013-04-18 | Disposition: A | Discharge status: Home / Self Care | Payer: Medicare Other | Source: Ambulatory Visit | Attending: Neurological Surgery | Admitting: Neurological Surgery

## 2013-04-18 DIAGNOSIS — M549 Dorsalgia, unspecified: Secondary | ICD-10-CM

## 2013-05-05 ENCOUNTER — Ambulatory Visit: Payer: Medicare Other | Attending: Neurological Surgery | Admitting: Physical Therapy

## 2013-05-05 DIAGNOSIS — M545 Low back pain, unspecified: Secondary | ICD-10-CM | POA: Insufficient documentation

## 2013-05-05 DIAGNOSIS — IMO0001 Reserved for inherently not codable concepts without codable children: Secondary | ICD-10-CM | POA: Insufficient documentation

## 2013-05-05 DIAGNOSIS — R5381 Other malaise: Secondary | ICD-10-CM | POA: Insufficient documentation

## 2013-05-07 ENCOUNTER — Ambulatory Visit: Payer: Medicare Other | Admitting: Physical Therapy

## 2013-05-12 ENCOUNTER — Ambulatory Visit: Payer: Medicare Other | Admitting: Physical Therapy

## 2013-05-14 ENCOUNTER — Ambulatory Visit: Payer: Medicare Other | Admitting: Physical Therapy

## 2013-05-19 ENCOUNTER — Ambulatory Visit: Payer: Medicare Other | Admitting: Physical Therapy

## 2013-05-21 ENCOUNTER — Ambulatory Visit: Payer: Medicare Other | Admitting: Physical Therapy

## 2013-05-26 ENCOUNTER — Ambulatory Visit: Payer: Medicare Other | Admitting: Physical Therapy

## 2013-05-28 ENCOUNTER — Ambulatory Visit: Payer: Medicare Other | Admitting: Physical Therapy

## 2013-06-02 ENCOUNTER — Ambulatory Visit: Payer: Medicare Other | Admitting: Physical Therapy

## 2013-06-04 ENCOUNTER — Ambulatory Visit: Payer: Medicare Other | Admitting: Physical Therapy

## 2013-12-21 ENCOUNTER — Emergency Department (HOSPITAL_COMMUNITY): Payer: Medicare Other

## 2013-12-21 ENCOUNTER — Inpatient Hospital Stay (HOSPITAL_COMMUNITY)
Admission: EM | Admit: 2013-12-21 | Discharge: 2014-01-03 | DRG: 870 | Disposition: A | Discharge status: Hospice | Payer: Medicare Other | Attending: Internal Medicine | Admitting: Internal Medicine

## 2013-12-21 DIAGNOSIS — E872 Acidosis, unspecified: Secondary | ICD-10-CM | POA: Diagnosis present

## 2013-12-21 DIAGNOSIS — D6959 Other secondary thrombocytopenia: Secondary | ICD-10-CM | POA: Diagnosis not present

## 2013-12-21 DIAGNOSIS — N179 Acute kidney failure, unspecified: Secondary | ICD-10-CM

## 2013-12-21 DIAGNOSIS — I472 Ventricular tachycardia, unspecified: Secondary | ICD-10-CM | POA: Diagnosis not present

## 2013-12-21 DIAGNOSIS — Z96659 Presence of unspecified artificial knee joint: Secondary | ICD-10-CM

## 2013-12-21 DIAGNOSIS — J4489 Other specified chronic obstructive pulmonary disease: Secondary | ICD-10-CM | POA: Diagnosis present

## 2013-12-21 DIAGNOSIS — R55 Syncope and collapse: Secondary | ICD-10-CM

## 2013-12-21 DIAGNOSIS — S32010A Wedge compression fracture of first lumbar vertebra, initial encounter for closed fracture: Secondary | ICD-10-CM

## 2013-12-21 DIAGNOSIS — E440 Moderate protein-calorie malnutrition: Secondary | ICD-10-CM | POA: Diagnosis present

## 2013-12-21 DIAGNOSIS — L89109 Pressure ulcer of unspecified part of back, unspecified stage: Secondary | ICD-10-CM | POA: Diagnosis present

## 2013-12-21 DIAGNOSIS — R1310 Dysphagia, unspecified: Secondary | ICD-10-CM

## 2013-12-21 DIAGNOSIS — R6520 Severe sepsis without septic shock: Secondary | ICD-10-CM

## 2013-12-21 DIAGNOSIS — N19 Unspecified kidney failure: Secondary | ICD-10-CM

## 2013-12-21 DIAGNOSIS — R0600 Dyspnea, unspecified: Secondary | ICD-10-CM

## 2013-12-21 DIAGNOSIS — I5043 Acute on chronic combined systolic (congestive) and diastolic (congestive) heart failure: Secondary | ICD-10-CM

## 2013-12-21 DIAGNOSIS — R531 Weakness: Secondary | ICD-10-CM

## 2013-12-21 DIAGNOSIS — Z23 Encounter for immunization: Secondary | ICD-10-CM

## 2013-12-21 DIAGNOSIS — J9819 Other pulmonary collapse: Secondary | ICD-10-CM

## 2013-12-21 DIAGNOSIS — M129 Arthropathy, unspecified: Secondary | ICD-10-CM | POA: Diagnosis present

## 2013-12-21 DIAGNOSIS — L8995 Pressure ulcer of unspecified site, unstageable: Secondary | ICD-10-CM | POA: Diagnosis present

## 2013-12-21 DIAGNOSIS — D62 Acute posthemorrhagic anemia: Secondary | ICD-10-CM

## 2013-12-21 DIAGNOSIS — Z79899 Other long term (current) drug therapy: Secondary | ICD-10-CM

## 2013-12-21 DIAGNOSIS — Z85828 Personal history of other malignant neoplasm of skin: Secondary | ICD-10-CM

## 2013-12-21 DIAGNOSIS — F411 Generalized anxiety disorder: Secondary | ICD-10-CM | POA: Diagnosis not present

## 2013-12-21 DIAGNOSIS — D649 Anemia, unspecified: Secondary | ICD-10-CM | POA: Diagnosis present

## 2013-12-21 DIAGNOSIS — J9811 Atelectasis: Secondary | ICD-10-CM

## 2013-12-21 DIAGNOSIS — R092 Respiratory arrest: Secondary | ICD-10-CM

## 2013-12-21 DIAGNOSIS — Z515 Encounter for palliative care: Secondary | ICD-10-CM

## 2013-12-21 DIAGNOSIS — I447 Left bundle-branch block, unspecified: Secondary | ICD-10-CM | POA: Diagnosis present

## 2013-12-21 DIAGNOSIS — K922 Gastrointestinal hemorrhage, unspecified: Secondary | ICD-10-CM | POA: Diagnosis not present

## 2013-12-21 DIAGNOSIS — A419 Sepsis, unspecified organism: Principal | ICD-10-CM | POA: Diagnosis present

## 2013-12-21 DIAGNOSIS — E870 Hyperosmolality and hypernatremia: Secondary | ICD-10-CM

## 2013-12-21 DIAGNOSIS — R6521 Severe sepsis with septic shock: Secondary | ICD-10-CM

## 2013-12-21 DIAGNOSIS — I739 Peripheral vascular disease, unspecified: Secondary | ICD-10-CM | POA: Diagnosis present

## 2013-12-21 DIAGNOSIS — I10 Essential (primary) hypertension: Secondary | ICD-10-CM | POA: Diagnosis present

## 2013-12-21 DIAGNOSIS — I959 Hypotension, unspecified: Secondary | ICD-10-CM

## 2013-12-21 DIAGNOSIS — I469 Cardiac arrest, cause unspecified: Secondary | ICD-10-CM | POA: Diagnosis present

## 2013-12-21 DIAGNOSIS — E876 Hypokalemia: Secondary | ICD-10-CM | POA: Diagnosis not present

## 2013-12-21 DIAGNOSIS — N170 Acute kidney failure with tubular necrosis: Secondary | ICD-10-CM | POA: Diagnosis present

## 2013-12-21 DIAGNOSIS — I4729 Other ventricular tachycardia: Secondary | ICD-10-CM | POA: Diagnosis not present

## 2013-12-21 DIAGNOSIS — E878 Other disorders of electrolyte and fluid balance, not elsewhere classified: Secondary | ICD-10-CM | POA: Diagnosis present

## 2013-12-21 DIAGNOSIS — J449 Chronic obstructive pulmonary disease, unspecified: Secondary | ICD-10-CM

## 2013-12-21 DIAGNOSIS — R9431 Abnormal electrocardiogram [ECG] [EKG]: Secondary | ICD-10-CM

## 2013-12-21 DIAGNOSIS — G9340 Encephalopathy, unspecified: Secondary | ICD-10-CM | POA: Diagnosis present

## 2013-12-21 DIAGNOSIS — I420 Dilated cardiomyopathy: Secondary | ICD-10-CM

## 2013-12-21 DIAGNOSIS — L89899 Pressure ulcer of other site, unspecified stage: Secondary | ICD-10-CM | POA: Diagnosis present

## 2013-12-21 DIAGNOSIS — J151 Pneumonia due to Pseudomonas: Secondary | ICD-10-CM | POA: Diagnosis present

## 2013-12-21 DIAGNOSIS — R4182 Altered mental status, unspecified: Secondary | ICD-10-CM

## 2013-12-21 DIAGNOSIS — J690 Pneumonitis due to inhalation of food and vomit: Secondary | ICD-10-CM | POA: Diagnosis present

## 2013-12-21 DIAGNOSIS — E119 Type 2 diabetes mellitus without complications: Secondary | ICD-10-CM | POA: Diagnosis present

## 2013-12-21 DIAGNOSIS — E861 Hypovolemia: Secondary | ICD-10-CM | POA: Diagnosis present

## 2013-12-21 DIAGNOSIS — R634 Abnormal weight loss: Secondary | ICD-10-CM

## 2013-12-21 DIAGNOSIS — E785 Hyperlipidemia, unspecified: Secondary | ICD-10-CM | POA: Diagnosis present

## 2013-12-21 DIAGNOSIS — J9600 Acute respiratory failure, unspecified whether with hypoxia or hypercapnia: Secondary | ICD-10-CM

## 2013-12-21 DIAGNOSIS — F17200 Nicotine dependence, unspecified, uncomplicated: Secondary | ICD-10-CM | POA: Diagnosis present

## 2013-12-21 DIAGNOSIS — I428 Other cardiomyopathies: Secondary | ICD-10-CM | POA: Diagnosis present

## 2013-12-21 DIAGNOSIS — Z66 Do not resuscitate: Secondary | ICD-10-CM | POA: Diagnosis not present

## 2013-12-21 DIAGNOSIS — T68XXXA Hypothermia, initial encounter: Secondary | ICD-10-CM

## 2013-12-21 HISTORY — DX: Left bundle-branch block, unspecified: I44.7

## 2013-12-21 LAB — POCT I-STAT, CHEM 8
CREATININE: 4.2 mg/dL — AB (ref 0.50–1.35)
Calcium, Ion: 0.95 mmol/L — ABNORMAL LOW (ref 1.13–1.30)
Chloride: 122 mEq/L — ABNORMAL HIGH (ref 96–112)
Glucose, Bld: 119 mg/dL — ABNORMAL HIGH (ref 70–99)
HCT: 48 % (ref 39.0–52.0)
Hemoglobin: 16.3 g/dL (ref 13.0–17.0)
POTASSIUM: 4.5 meq/L (ref 3.7–5.3)
SODIUM: 158 meq/L — AB (ref 137–147)
TCO2: 17 mmol/L (ref 0–100)

## 2013-12-21 LAB — CBC WITH DIFFERENTIAL/PLATELET
Basophils Absolute: 0 10*3/uL (ref 0.0–0.1)
Basophils Relative: 0 % (ref 0–1)
EOS ABS: 0 10*3/uL (ref 0.0–0.7)
Eosinophils Relative: 0 % (ref 0–5)
HCT: 47.6 % (ref 39.0–52.0)
HEMOGLOBIN: 16.4 g/dL (ref 13.0–17.0)
LYMPHS ABS: 1.3 10*3/uL (ref 0.7–4.0)
Lymphocytes Relative: 15 % (ref 12–46)
MCH: 30.1 pg (ref 26.0–34.0)
MCHC: 34.5 g/dL (ref 30.0–36.0)
MCV: 87.5 fL (ref 78.0–100.0)
MONOS PCT: 8 % (ref 3–12)
Monocytes Absolute: 0.7 10*3/uL (ref 0.1–1.0)
NEUTROS PCT: 77 % (ref 43–77)
Neutro Abs: 6.7 10*3/uL (ref 1.7–7.7)
Platelets: 216 10*3/uL (ref 150–400)
RBC: 5.44 MIL/uL (ref 4.22–5.81)
RDW: 16 % — ABNORMAL HIGH (ref 11.5–15.5)
WBC: 8.6 10*3/uL (ref 4.0–10.5)

## 2013-12-21 LAB — COMPREHENSIVE METABOLIC PANEL
ALBUMIN: 2.6 g/dL — AB (ref 3.5–5.2)
ALT: 28 U/L (ref 0–53)
AST: 31 U/L (ref 0–37)
Alkaline Phosphatase: 51 U/L (ref 39–117)
BUN: 175 mg/dL — ABNORMAL HIGH (ref 6–23)
CALCIUM: 7.7 mg/dL — AB (ref 8.4–10.5)
CO2: 14 mEq/L — ABNORMAL LOW (ref 19–32)
CREATININE: 3.99 mg/dL — AB (ref 0.50–1.35)
Chloride: 118 mEq/L — ABNORMAL HIGH (ref 96–112)
GFR calc Af Amer: 15 mL/min — ABNORMAL LOW (ref >90)
GFR, EST NON AFRICAN AMERICAN: 13 mL/min — AB (ref >90)
Glucose, Bld: 121 mg/dL — ABNORMAL HIGH (ref 70–99)
Potassium: 4.6 mEq/L (ref 3.7–5.3)
Sodium: 159 mEq/L — ABNORMAL HIGH (ref 137–147)
TOTAL PROTEIN: 6.4 g/dL (ref 6.0–8.3)
Total Bilirubin: 1 mg/dL (ref 0.3–1.2)

## 2013-12-21 LAB — URINALYSIS, ROUTINE W REFLEX MICROSCOPIC
GLUCOSE, UA: NEGATIVE mg/dL
HGB URINE DIPSTICK: NEGATIVE
Ketones, ur: NEGATIVE mg/dL
LEUKOCYTES UA: NEGATIVE
Nitrite: NEGATIVE
Protein, ur: NEGATIVE mg/dL
SPECIFIC GRAVITY, URINE: 1.023 (ref 1.005–1.030)
Urobilinogen, UA: 0.2 mg/dL (ref 0.0–1.0)
pH: 5 (ref 5.0–8.0)

## 2013-12-21 LAB — TROPONIN I

## 2013-12-21 LAB — CK: CK TOTAL: 304 U/L — AB (ref 7–232)

## 2013-12-21 MED ORDER — SODIUM CHLORIDE 0.9 % IV BOLUS (SEPSIS): 1000.0000 mL | Freq: Once | INTRAVENOUS | Status: DC

## 2013-12-21 MED ORDER — SODIUM CHLORIDE 0.9 % IV BOLUS (SEPSIS)
1000.0000 mL | Freq: Once | INTRAVENOUS | Status: AC
  Administered 2013-12-21: 1000 mL via INTRAVENOUS

## 2013-12-21 MED ORDER — DEXTROSE 5 % IV SOLN: 1.0000 g | INTRAVENOUS | Status: DC

## 2013-12-21 MED ORDER — SODIUM CHLORIDE 0.9 % IV SOLN
1.0000 g | Freq: Once | INTRAVENOUS | Status: AC
  Administered 2013-12-21: 1 g via INTRAVENOUS
  Filled 2013-12-21: qty 10

## 2013-12-21 MED ORDER — CALCIUM GLUCONATE 10 % IV SOLN: 1.0000 g | Freq: Once | INTRAVENOUS | Status: DC

## 2013-12-21 NOTE — H&P (Signed)
Name: Shane Pope MRN: [id number] DOB: 03/26/1931    ADMISSION DATE:  12/21/2013 CONSULTATION DATE:  2/16  REFERRING MD :  [facility]  PRIMARY SERVICE: PCCM   CHIEF COMPLAINT:  Acute renal failure   BRIEF PATIENT DESCRIPTION:  80 year old male found on floor by family. Confused, unable to get up. In ER found to be hypotensive and in acute renal failure w/ profound metabolic derangements. PCCM asked to admit   SIGNIFICANT EVENTS / STUDIES:  Lumbar spine 2/16: no acute fx Thoracic spine 2/16: no acute fx, old L1 compression fx w/ vertebroplasty  CT head 2/16: negative  Pelvis 2/16: neg   LINES / TUBES:   CULTURES: BCX 2 2/16>>> UC 2/16>>>  ANTIBIOTICS:   HISTORY OF PRESENT ILLNESS:   80 yo male lives alone, prior very independent, anemia, htn, gerd, copd presented to ER 2/16 after being found down in his room by family. Pt last seen normal the evening of 2/12. Pt does not recall details of why he was on the ground or unable to call for help. No po intake since. Pt has fatigue but oriented X2-3Pt has had unknown amount of wt loss recently. No cardiac or stroke hx. Did report more tarry stools recently, but unable to give more detail at time of interview. Difficult to understand patient due to significant dry mm. Hypothermic on arrival 91 degrees, pt hypotensive PTA 70s improved with fluids to 100. Dx eval showed: several metabolic derangements, acute renal failure and + AG acidosis. PCCM asked to admit.   PAST MEDICAL HISTORY :  Past Medical History  Diagnosis Date  . Hypertension   . Diabetes mellitus   . Anemia   . Blood transfusion     hx of   . GERD (gastroesophageal reflux disease)   . Arthritis   . 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 Fracture     hx of right wrist fracture   . Fracture, ribs     hx of on right side   . Pneumothorax on right   . PVD (peripheral vascular disease)   . Hyperlipidemia    Past Surgical History  Procedure Laterality  Date  . Hernia repair      right inguinal hernia repair   . Other surgical history      undescended testicle surgery  . Cholecystectomy    . Hemorrhoid surgery    . Eye surgery      bilateral cataract surgery  . Knee arthroscopy      right knee   . Appendectomy    . Total knee arthroplasty  03/24/2012    Procedure: TOTAL KNEE ARTHROPLASTY;  Surgeon: Gearlean Alf, MD;  Location: [facility];  Service: Orthopedics;  Laterality: Right;   Prior to Admission medications   Medication Sig Start Date End Date Taking? Authorizing Provider  HYDROcodone-acetaminophen (NORCO/VICODIN) 5-325 MG per tablet Take 1-2 tablets by mouth every 6 (six) hours as needed for moderate pain.   Yes Historical Provider, MD  CELEBREX 200 MG capsule Take 200 mg by mouth 2 (two) times daily. 09/22/13   Historical Provider, MD  docusate sodium 100 MG CAPS Take 100 mg by mouth 2 (two) times daily. 12/11/12   Janalyn Rouse, MD  ketoconazole (NIZORAL) 2 % cream Apply 1 application topically daily. 11/23/13   Historical Provider, MD  lisinopril (PRINIVIL,ZESTRIL) 20 MG tablet Take 20 mg by mouth daily. 11/20/13   Historical Provider, MD  oxyCODONE-acetaminophen (PERCOCET) 7.5-325 MG per tablet Take 1 tablet by mouth every 6 (six) hours as needed for pain.  11/22/13   Historical Provider, MD   No Known Allergies  FAMILY HISTORY:  No family history on file. SOCIAL HISTORY:  reports that he has been smoking Cigars.  He has never used smokeless tobacco. He reports that he does not drink alcohol or use illicit drugs.  REVIEW OF SYSTEMS:  Unable   SUBJECTIVE:  Appears to be responding to IVFs  VITAL SIGNS: Temp:  [93.6 F (34.2 C)-98.4 F (36.9 C)] 98.4 F (36.9 C) (02/16 2320) Pulse Rate:  [92-102] 102 (02/16 2320) Resp:  [23-37] 23 (02/16 2320) BP: (87-131)/(30-73) 104/60 mmHg (02/16 2320) SpO2:  [89 %-94 %] 91 % (02/16 2320) HEMODYNAMICS:   VENTILATOR SETTINGS:   INTAKE / OUTPUT: Intake/Output     02/16 0701 -  02/17 0700   I.V. 2000   Total Intake 2000   Urine 775   Total Output 775   Net +1225         PHYSICAL EXAMINATION: General:  Weak, frail elderly male, not in acute distress.  Neuro:  Awake,w eak, f/c. Oriented X 2 HEENT:  Very dry mucous membranes. Thick dried layered skin over tongue and on back of hard palate  Cardiovascular:  rrr Lungs:  CTA no accessory muscle use  Abdomen:  Non-tender + bowel sounds, no OM  Musculoskeletal:  Poor muscle tone, no edema.  Skin:  Non-stagable pressure ulcers on sacrun and left arm. LE cool   LABS:  CBC  Recent Labs Lab 12/21/13 2138 12/21/13 2149  WBC 8.6  --   HGB 16.4 16.3  HCT 47.6 48.0  PLT 216  --    Coag's No results found for this basename: APTT, INR,  in the last 168 hours BMET  Recent Labs Lab 12/21/13 2138 12/21/13 2149  NA 159* 158*  K 4.6 4.5  CL 118* 122*  CO2 14*  --   BUN 175* >140*  CREATININE 3.99* 4.20*  GLUCOSE 121* 119*   Electrolytes  Recent Labs Lab 12/21/13 2138  CALCIUM 7.7*   Sepsis Markers No results found for this basename: LATICACIDVEN, PROCALCITON, O2SATVEN,  in the last 168 hours ABG No results found for this basename: PHART, PCO2ART, PO2ART,  in the last 168 hours Liver Enzymes  Recent Labs Lab 12/21/13 2138  AST 31  ALT 28  ALKPHOS 51  BILITOT 1.0  ALBUMIN 2.6*   Cardiac Enzymes  Recent Labs Lab 12/21/13 2138  TROPONINI <0.30   Glucose No results found for this basename: GLUCAP,  in the last 168 hours  Imaging Dg Thoracic Spine 2 View  12/21/2013   CLINICAL DATA:  Fall, back pain.  EXAM: THORACIC SPINE - 2 VIEW  COMPARISON:  03/31/2013  FINDINGS: There is an old severe compression fracture noted at what appears to be the L1 vertebral body with vertebroplasty changes. No acute fracture. Diffuse osteopenia. Normal alignment.  IMPRESSION: No acute fracture. Old L1 compression fracture with prior vertebroplasty.   Electronically Signed   By: Rolm Baptise M.D.   On:  12/21/2013 22:33   Dg Lumbar Spine 2-3 Views  12/21/2013   CLINICAL DATA:  Back pain.  Fall.  EXAM: LUMBAR SPINE - 2-3 VIEW  COMPARISON:  MR L SPINE W/O dated 04/18/2013; DG THORACOLUMBAR SPINE 2V dated 03/31/2013  FINDINGS: Old severe compression fracture at L1, status post vertebroplasty. Stable mild compression through the inferior endplate of 624THL. No acute fracture. Normal  alignment. Degenerative facet disease in the lower lumbar spine.  Aortic and iliac calcifications without visible aneurysm.  IMPRESSION: No acute bony abnormality.   Electronically Signed   By: Rolm Baptise M.D.   On: 12/21/2013 22:35   Dg Pelvis 1-2 Views  12/21/2013   CLINICAL DATA:  Weakness, fall  EXAM: PELVIS - 1-2 VIEW  COMPARISON:  None.  FINDINGS: The exam is oblique due to patient motion. Hips are located. No evidence of pelvic fracture or sacral fracture. Marland Kitchen  IMPRESSION: No acute osseous abnormality.  Sacrum poorly evaluate   Electronically Signed   By: Suzy Bouchard M.D.   On: 12/21/2013 22:25   Ct Head Wo Contrast  12/21/2013   CLINICAL DATA:  Found down.  EXAM: CT HEAD WITHOUT CONTRAST  CT CERVICAL SPINE WITHOUT CONTRAST  TECHNIQUE: Multidetector CT imaging of the head and cervical spine was performed following the standard protocol without intravenous contrast. Multiplanar CT image reconstructions of the cervical spine were also generated.  COMPARISON:  None.  FINDINGS: CT HEAD FINDINGS  Diffuse cerebral atrophy. No acute intracranial abnormality. Specifically, no hemorrhage, hydrocephalus, mass lesion, acute infarction, or significant intracranial injury. No acute calvarial abnormality.  Mucosal thickening throughout the paranasal sinuses. Air-fluid levels noted in the right maxillary sinus and sphenoid sinus. Mastoid air cells are clear.  CT CERVICAL SPINE FINDINGS  Image quality is degraded by motion. No evidence of malalignment. No definite fracture. Prevertebral soft tissues are normal. Layering frothy material  noted within the oropharynx and nasopharynx posteriorly. No epidural or paraspinal hematoma.  Diffuse enlargement of the thyroid compatible with goiter.  IMPRESSION: No acute intracranial abnormality.  No acute bony abnormality in the cervical spine.  Frothy material layering within the airway.   Electronically Signed   By: Rolm Baptise M.D.   On: 12/21/2013 22:11   Ct Cervical Spine Wo Contrast  12/21/2013   CLINICAL DATA:  Found down.  EXAM: CT HEAD WITHOUT CONTRAST  CT CERVICAL SPINE WITHOUT CONTRAST  TECHNIQUE: Multidetector CT imaging of the head and cervical spine was performed following the standard protocol without intravenous contrast. Multiplanar CT image reconstructions of the cervical spine were also generated.  COMPARISON:  None.  FINDINGS: CT HEAD FINDINGS  Diffuse cerebral atrophy. No acute intracranial abnormality. Specifically, no hemorrhage, hydrocephalus, mass lesion, acute infarction, or significant intracranial injury. No acute calvarial abnormality.  Mucosal thickening throughout the paranasal sinuses. Air-fluid levels noted in the right maxillary sinus and sphenoid sinus. Mastoid air cells are clear.  CT CERVICAL SPINE FINDINGS  Image quality is degraded by motion. No evidence of malalignment. No definite fracture. Prevertebral soft tissues are normal. Layering frothy material noted within the oropharynx and nasopharynx posteriorly. No epidural or paraspinal hematoma.  Diffuse enlargement of the thyroid compatible with goiter.  IMPRESSION: No acute intracranial abnormality.  No acute bony abnormality in the cervical spine.  Frothy material layering within the airway.   Electronically Signed   By: Rolm Baptise M.D.   On: 12/21/2013 22:11   Dg Chest Port 1 View  12/21/2013   CLINICAL DATA:  Weakness  EXAM: PORTABLE CHEST - 1 VIEW  COMPARISON:  03/27/2012  FINDINGS: Cardiac shadow is stable and mildly enlarged. Diffuse interstitial changes are again identified and stable. No focal  infiltrate or sizable effusion is seen. No acute bony abnormality is noted.  IMPRESSION: Chronic changes without acute abnormality.   Electronically Signed   By: Inez Catalina M.D.   On: 12/21/2013 21:31     CXR:  no acute   ASSESSMENT / PLAN:  PULMONARY A: No acute  He is at risk for aspiration  P:   NPO for now O2 as needed  F/u am CXR   CARDIOVASCULAR A: Hypovolemia  P:  IV hydration  Hold all antihypertensives  Admit to ICU   RENAL A:   Acute renal failure: in setting of dehydration. He was on ace-I and NSAID. Not sure if he has taken these in last few days.  Metabolic acidosis + anion gap (corrected AG 30.5 for albumin). Likely in setting of uremia  Hypernatremia  Hyperchloremia Dehydration    P:   IV hydration  Hold antihypertensives  Repeat total CKs Send urine myoglobin  strick I&O  May need renal involvement if no improvement w/ IV hydration by am rounds.    GASTROINTESTINAL A:  Possible UGIB  P:   See Heme section below  NPO   HEMATOLOGIC A:  Hemoconcentration. His hgb 1 year ago was 14.3, admitted w/ 16.4      R/O UGIB: has black tarry stools. His RDW would suggest an active process.  P:  hemeoccult stools, if positive will need GI consult on 2/17  Repeat CBC q 8 Hold [place] heparin  PPI    INFECTIOUS A:  No clear infection. At risk for aspiration  P:   Ck PCT Trend fever curve Consider aspiration coverage if PCT elevated, fever spikes or WBCs jump   ENDOCRINE A:  H/O DM  P:   cbg q4, start ssi if >150   NEUROLOGIC A:  Mild acute encephalopathy.  Seems to be improving w/ IVFs. His mouth is so dry it's difficult to understand his speech P:   Supportive care No narcotics   DERM A: sacral pressure ulcer: unstageable      Pressure ulcer right arm: unstageable  P: WON consult   TODAY'S SUMMARY:  Admit to ICU. Focus on hydration. Need to f/u both chemistry and CBC. Have also ordered stool occult blood testing. Concerned about slow UGIB  as initial culprit. Will provide IVFs tonight, hold BP meds and see how he does. May need central access. If FOB positive will need GI consult 2/17. Will hold off on Nephro consult for now.  Patient was improving with hydration.  However, given how long he was down, he expectorated then aspirated a large mucous plug.  Was called again bedside to evaluate.  Decision was made to intubate at that point.    I have personally obtained a history, examined the patient, evaluated laboratory and imaging results, formulated the assessment and plan and placed orders.  CRITICAL CARE: The patient is critically ill with multiple organ systems failure and requires high complexity decision making for assessment and support, frequent evaluation and titration of therapies, application of advanced monitoring technologies and extensive interpretation of multiple databases. Critical Care Time devoted to patient care services described in this note is 90 minutes.   Rush Farmer, M.D. [facility] Pulmonary/Critical Care Medicine. Pager: [phone]. After hours pager: [phone].
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
## 2013-12-21 NOTE — ED Provider Notes (Addendum)
CSN: [id number]     Arrival date & time 12/21/13  2042 History   First MD Initiated Contact with Patient 12/21/13 2043     Chief Complaint  Patient presents with  . Cold Exposure     (Consider location/radiation/quality/duration/timing/severity/associated sxs/prior Treatment) HPI Comments: 80 yo male lives alone, prior very independent, anemia, htn, gerd, copd presents after being found down in his room PTA.  Pt last seen normal Thursday evening.  Pt does not recall details of why he was on the ground or unable to call for help.  No po intake since.  Pt has fatigue but AO.  Family arrived but has not seen him recently.  Pt has had unknown amount of wt loss recently.  No cardiac or stroke hx.  Sx constant.  Difficult to understand patient due to significant dry mm. Hypothermic on arrival 91 degrees, pt hypotensive PTA 70s improved with fluids to 100.  The history is provided by the patient.    Past Medical History  Diagnosis Date  . Hypertension   . Diabetes mellitus   . Anemia   . Blood transfusion     hx of   . GERD (gastroesophageal reflux disease)   . Arthritis   . 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 Fracture     hx of right wrist fracture   . Fracture, ribs     hx of on right side   . Pneumothorax on right   . PVD (peripheral vascular disease)   . Hyperlipidemia    Past Surgical History  Procedure Laterality Date  . Hernia repair      right inguinal hernia repair   . Other surgical history      undescended testicle surgery  . Cholecystectomy    . Hemorrhoid surgery    . Eye surgery      bilateral cataract surgery  . Knee arthroscopy      right knee   . Appendectomy    . Total knee arthroplasty  03/24/2012    Procedure: TOTAL KNEE ARTHROPLASTY;  Surgeon: Gearlean Alf, MD;  Location: [facility];  Service: Orthopedics;  Laterality: Right;   No family history on file. History  Substance Use Topics  . Smoking status: Current Every Day Smoker -- 60 years     Types: Cigars  . Smokeless tobacco: Never Used  . Alcohol Use: No    Review of Systems  Constitutional: Positive for fatigue. Negative for fever and chills.  HENT: Negative for congestion.   Eyes: Negative for visual disturbance.  Respiratory: Negative for shortness of breath.   Cardiovascular: Negative for chest pain.  Gastrointestinal: Negative for vomiting and abdominal pain.  Genitourinary: Negative for dysuria and flank pain.  Musculoskeletal: Positive for arthralgias and myalgias. Negative for back pain, neck pain and neck stiffness.  Skin: Negative for rash.  Neurological: Positive for weakness. Negative for light-headedness and headaches.      Allergies  Review of patient's allergies indicates no known allergies.  Home Medications   Current Outpatient Rx  Name  Route  Sig  Dispense  Refill  . docusate sodium 100 MG CAPS   Oral   Take 100 mg by mouth 2 (two) times daily.   10 capsule      . HYDROcodone-acetaminophen (NORCO/VICODIN) 5-325 MG per tablet   Oral   Take 1-2 tablets by mouth every 4 (four) hours as needed.   60 tablet   1    There  were no vitals taken for this visit. Physical Exam  Nursing note and vitals reviewed. Constitutional: He is oriented to person, place, and time. He appears well-developed and well-nourished.  HENT:  Head: Normocephalic.  Extremely Dry mm, full rom, no midline cervical tenderness   Eyes: Conjunctivae are normal. Right eye exhibits no discharge. Left eye exhibits no discharge.  Neck: Normal range of motion. Neck supple. No tracheal deviation present.  Cardiovascular: Regular rhythm.  Tachycardia present.   Pulmonary/Chest: Effort normal. He has rales (bilateral bases).  Abdominal: Soft. He exhibits no distension. There is tenderness (mild epigastric). There is no guarding.  Musculoskeletal: He exhibits tenderness. He exhibits no edema.  Diffuse tenderness to muscles Equal movement ext bilateral. Mild tender bilateral  hips with ext rom, full rom of knees/ hips/ shoulders Mild tender midline thoracic and lumbar  Neurological: He is alert and oriented to person, place, and time.  Dysphagia likely from dry mm Alert/ oriented to place, name, location Moves ext equal bilateral Sensation intact PERRL, EOMFI Neck supple  Skin: Skin is warm. Rash noted.  Large sacral decub ulcer, dried blood surrounding  Psychiatric: He has a normal mood and affect.    ED Course  Procedures (including critical care time) CRITICAL CARE Performed by: Enid Skeens   Total critical care time: 70 min  Critical care time was exclusive of separately billable procedures and treating other patients.  Critical care was necessary to treat or prevent imminent or life-threatening deterioration.  Critical care was time spent personally by me on the following activities: development of treatment plan with patient and/or surrogate as well as nursing, discussions with consultants, evaluation of patient's response to treatment, examination of patient, obtaining history from patient or surrogate, ordering and performing treatments and interventions, ordering and review of laboratory studies, ordering and review of radiographic studies, pulse oximetry and re-evaluation of patient's condition.  Labs Review Labs Reviewed  COMPREHENSIVE METABOLIC PANEL - Abnormal; Notable for the following:    Sodium 159 (*)    Chloride 118 (*)    CO2 14 (*)    Glucose, Bld 121 (*)    BUN 175 (*)    Creatinine, Ser 3.99 (*)    Calcium 7.7 (*)    Albumin 2.6 (*)    GFR calc non Af Amer 13 (*)    GFR calc Af Amer 15 (*)    All other components within normal limits  CBC WITH DIFFERENTIAL - Abnormal; Notable for the following:    RDW 16.0 (*)    All other components within normal limits  CK - Abnormal; Notable for the following:    Total CK 304 (*)    All other components within normal limits  URINALYSIS, ROUTINE W REFLEX MICROSCOPIC - Abnormal;  Notable for the following:    Color, Urine AMBER (*)    APPearance CLOUDY (*)    Bilirubin Urine SMALL (*)    All other components within normal limits  POCT I-STAT, CHEM 8 - Abnormal; Notable for the following:    Sodium 158 (*)    Chloride 122 (*)    BUN >140 (*)    Creatinine, Ser 4.20 (*)    Glucose, Bld 119 (*)    Calcium, Ion 0.95 (*)    All other components within normal limits  POCT I-STAT 3, BLOOD GAS (G3+) - Abnormal; Notable for the following:    pH, Arterial 7.284 (*)    pCO2 arterial 31.6 (*)    pO2, Arterial 54.0 (*)  Bicarbonate 15.0 (*)    Acid-base deficit 11.0 (*)    All other components within normal limits  CULTURE, BLOOD (ROUTINE X 2)  CULTURE, BLOOD (ROUTINE X 2)  URINE CULTURE  TROPONIN I  URINALYSIS, ROUTINE W REFLEX MICROSCOPIC  PRO B NATRIURETIC PEPTIDE  MAGNESIUM  BASIC METABOLIC PANEL  BASIC METABOLIC PANEL  BASIC METABOLIC PANEL  MAGNESIUM  PHOSPHORUS  PROCALCITONIN  CBC  CBC  CBC  BLOOD GAS, ARTERIAL  MYOGLOBIN, URINE  OCCULT BLOOD X 1 CARD TO LAB, STOOL  I-STAT CG4 LACTIC ACID, ED   Imaging Review Dg Thoracic Spine 2 View  12/21/2013   CLINICAL DATA:  Fall, back pain.  EXAM: THORACIC SPINE - 2 VIEW  COMPARISON:  03/31/2013  FINDINGS: There is an old severe compression fracture noted at what appears to be the L1 vertebral body with vertebroplasty changes. No acute fracture. Diffuse osteopenia. Normal alignment.  IMPRESSION: No acute fracture. Old L1 compression fracture with prior vertebroplasty.   Electronically Signed   By: Rolm Baptise M.D.   On: 12/21/2013 22:33   Dg Lumbar Spine 2-3 Views  12/21/2013   CLINICAL DATA:  Back pain.  Fall.  EXAM: LUMBAR SPINE - 2-3 VIEW  COMPARISON:  MR L SPINE W/O dated 04/18/2013; DG THORACOLUMBAR SPINE 2V dated 03/31/2013  FINDINGS: Old severe compression fracture at L1, status post vertebroplasty. Stable mild compression through the inferior endplate of 624THL. No acute fracture. Normal alignment.  Degenerative facet disease in the lower lumbar spine.  Aortic and iliac calcifications without visible aneurysm.  IMPRESSION: No acute bony abnormality.   Electronically Signed   By: Rolm Baptise M.D.   On: 12/21/2013 22:35   Dg Pelvis 1-2 Views  12/21/2013   CLINICAL DATA:  Weakness, fall  EXAM: PELVIS - 1-2 VIEW  COMPARISON:  None.  FINDINGS: The exam is oblique due to patient motion. Hips are located. No evidence of pelvic fracture or sacral fracture. Marland Kitchen  IMPRESSION: No acute osseous abnormality.  Sacrum poorly evaluate   Electronically Signed   By: Suzy Bouchard M.D.   On: 12/21/2013 22:25   Ct Head Wo Contrast  12/21/2013   CLINICAL DATA:  Found down.  EXAM: CT HEAD WITHOUT CONTRAST  CT CERVICAL SPINE WITHOUT CONTRAST  TECHNIQUE: Multidetector CT imaging of the head and cervical spine was performed following the standard protocol without intravenous contrast. Multiplanar CT image reconstructions of the cervical spine were also generated.  COMPARISON:  None.  FINDINGS: CT HEAD FINDINGS  Diffuse cerebral atrophy. No acute intracranial abnormality. Specifically, no hemorrhage, hydrocephalus, mass lesion, acute infarction, or significant intracranial injury. No acute calvarial abnormality.  Mucosal thickening throughout the paranasal sinuses. Air-fluid levels noted in the right maxillary sinus and sphenoid sinus. Mastoid air cells are clear.  CT CERVICAL SPINE FINDINGS  Image quality is degraded by motion. No evidence of malalignment. No definite fracture. Prevertebral soft tissues are normal. Layering frothy material noted within the oropharynx and nasopharynx posteriorly. No epidural or paraspinal hematoma.  Diffuse enlargement of the thyroid compatible with goiter.  IMPRESSION: No acute intracranial abnormality.  No acute bony abnormality in the cervical spine.  Frothy material layering within the airway.   Electronically Signed   By: Rolm Baptise M.D.   On: 12/21/2013 22:11   Ct Cervical Spine Wo  Contrast  12/21/2013   CLINICAL DATA:  Found down.  EXAM: CT HEAD WITHOUT CONTRAST  CT CERVICAL SPINE WITHOUT CONTRAST  TECHNIQUE: Multidetector CT imaging of the head and cervical spine  was performed following the standard protocol without intravenous contrast. Multiplanar CT image reconstructions of the cervical spine were also generated.  COMPARISON:  None.  FINDINGS: CT HEAD FINDINGS  Diffuse cerebral atrophy. No acute intracranial abnormality. Specifically, no hemorrhage, hydrocephalus, mass lesion, acute infarction, or significant intracranial injury. No acute calvarial abnormality.  Mucosal thickening throughout the paranasal sinuses. Air-fluid levels noted in the right maxillary sinus and sphenoid sinus. Mastoid air cells are clear.  CT CERVICAL SPINE FINDINGS  Image quality is degraded by motion. No evidence of malalignment. No definite fracture. Prevertebral soft tissues are normal. Layering frothy material noted within the oropharynx and nasopharynx posteriorly. No epidural or paraspinal hematoma.  Diffuse enlargement of the thyroid compatible with goiter.  IMPRESSION: No acute intracranial abnormality.  No acute bony abnormality in the cervical spine.  Frothy material layering within the airway.   Electronically Signed   By: Rolm Baptise M.D.   On: 12/21/2013 22:11   Dg Chest Port 1 View  12/21/2013   CLINICAL DATA:  Weakness  EXAM: PORTABLE CHEST - 1 VIEW  COMPARISON:  03/27/2012  FINDINGS: Cardiac shadow is stable and mildly enlarged. Diffuse interstitial changes are again identified and stable. No focal infiltrate or sizable effusion is seen. No acute bony abnormality is noted.  IMPRESSION: Chronic changes without acute abnormality.   Electronically Signed   By: Inez Catalina M.D.   On: 12/21/2013 21:31    EKG Interpretation    Date/Time:  Monday December 21 2013 20:48:13 EST Ventricular Rate:  97 PR Interval:    QRS Duration: 129 QT Interval:  390 QTC Calculation: 495 R Axis:   99 Text  Interpretation:  Poor baseline, sinus Consider left ventricular hypertrophy Repol abnrm, global ischemia, diffuse leads Confirmed by Kalah Pflum  MD, Nechama Escutia ([id number]) on 12/21/2013 10:12:19 PM            MDM   Final diagnoses:  ARF (acute renal failure)  Hypotension  General weakness  Uremia  Abnormal EKG  Weight loss  Acute respiratory failure  Hypothermia  Hypernatremia  Hypocalcemia  Respiratory arrest  Acute Cardiac Respiratory arrest  Concern for rhabdo Unknown cause of why patient was found down.    2 L fluid bolus, warm saline, warm blankets given. BP improved with fluids. With sirs criteria, rocephin given. Ca for hypoCa. Mg sent. Repeat fluid bolus ordered with ARF, concern for possible rhabdo and dehydration. Filed Vitals:   12/21/13 2231 12/21/13 2235 12/21/13 2245 12/21/13 2300  BP: 111/69 106/66 98/71   Pulse: 93 94 98 98  Temp:   97.7 F (36.5 C) 98.2 F (36.8 C)  TempSrc:      Resp: 28 31 28  37  SpO2: 89% 90% 93% 93%   Pt revieved 4 L fluids, BP hypotensive initially in 11B, 90 systolic on recheck Spoke with [facility] on call, recommended ICU admit. Spoke with critical care, accepted patient to ICU.   Updated family.  The patients results and plan were reviewed and discussed.   Any x-rays performed were personally reviewed by myself.   Differential diagnosis were considered with the presenting HPI.    EKG: reviewed  Admission/ observation were discussed with the admitting physician, patient and/or family and they are comfortable with the plan.     Mariea Clonts, MD 12/21/13 2322  Prior to admission patient started coughing/ choking sensation and then became cyanotic. Bagged patient, briefly lost a pulse.  Regained a pulse after CPR for 2 minutes.    Decision to  emergently intubate for hypoxia and acute resp failure.   Difficult intubation due to secretions, suctioned, glide scope. Critical care updated.  Family updated.    Cardiopulmonary Resuscitation (CPR) Procedure Note Directed/Performed by: Mariea Clonts I personally directed ancillary staff and/or performed CPR in an effort to regain return of spontaneous circulation and to maintain cardiac, neuro and systemic perfusion.   INTUBATION Performed by: Mariea Clonts  Required items: required blood products, implants, devices, and special equipment available Patient identity confirmed: provided demographic data and hospital-assigned identification number Time out: Immediately prior to procedure a "time out" was called to verify the correct patient, procedure, equipment, support staff.  Indications: resp failure, acute Intubation method: Direct and glidescope Preoxygenation: BVM Sedatives: Etomidate Paralytic: Rocuronium Tube Size: 7.5cuffed  Post-procedure assessment: chest rise and ETCO2 monitor Breath sounds: equal and absent over the epigastrium Tube secured with: ETT holder Chest x-ray pending.  Patient tolerated the procedure well with no immediate complications.   Mariea Clonts, MD 12/22/13 0120  Mariea Clonts, MD 12/22/13 [phone]

## 2013-12-22 ENCOUNTER — Inpatient Hospital Stay (HOSPITAL_COMMUNITY): Payer: Medicare Other

## 2013-12-22 ENCOUNTER — Encounter (HOSPITAL_COMMUNITY): Payer: Self-pay | Admitting: Emergency Medicine

## 2013-12-22 DIAGNOSIS — K922 Gastrointestinal hemorrhage, unspecified: Secondary | ICD-10-CM

## 2013-12-22 DIAGNOSIS — I959 Hypotension, unspecified: Secondary | ICD-10-CM

## 2013-12-22 DIAGNOSIS — J9600 Acute respiratory failure, unspecified whether with hypoxia or hypercapnia: Secondary | ICD-10-CM

## 2013-12-22 DIAGNOSIS — D62 Acute posthemorrhagic anemia: Secondary | ICD-10-CM

## 2013-12-22 DIAGNOSIS — E440 Moderate protein-calorie malnutrition: Secondary | ICD-10-CM | POA: Insufficient documentation

## 2013-12-22 DIAGNOSIS — N179 Acute kidney failure, unspecified: Secondary | ICD-10-CM

## 2013-12-22 DIAGNOSIS — R4182 Altered mental status, unspecified: Secondary | ICD-10-CM

## 2013-12-22 LAB — URINALYSIS, ROUTINE W REFLEX MICROSCOPIC
GLUCOSE, UA: NEGATIVE mg/dL
Ketones, ur: 15 mg/dL — AB
Nitrite: NEGATIVE
PROTEIN: NEGATIVE mg/dL
Specific Gravity, Urine: 1.019 (ref 1.005–1.030)
Urobilinogen, UA: 1 mg/dL (ref 0.0–1.0)
pH: 5 (ref 5.0–8.0)

## 2013-12-22 LAB — BASIC METABOLIC PANEL
BUN: 163 mg/dL — AB (ref 6–23)
BUN: 165 mg/dL — AB (ref 6–23)
BUN: 163 mg/dL — ABNORMAL HIGH (ref 6–23)
BUN: 155 mg/dL — ABNORMAL HIGH (ref 6–23)
CHLORIDE: 121 meq/L — AB (ref 96–112)
CO2: 16 mEq/L — ABNORMAL LOW (ref 19–32)
CO2: 17 mEq/L — ABNORMAL LOW (ref 19–32)
CO2: 18 mEq/L — ABNORMAL LOW (ref 19–32)
CO2: 15 meq/L — AB (ref 19–32)
CREATININE: 4.01 mg/dL — AB (ref 0.50–1.35)
CREATININE: 4.06 mg/dL — AB (ref 0.50–1.35)
CREATININE: 4.2 mg/dL — AB (ref 0.50–1.35)
CREATININE: 3.84 mg/dL — AB (ref 0.50–1.35)
Calcium: 7.6 mg/dL — ABNORMAL LOW (ref 8.4–10.5)
Calcium: 8.3 mg/dL — ABNORMAL LOW (ref 8.4–10.5)
Calcium: 7.7 mg/dL — ABNORMAL LOW (ref 8.4–10.5)
Calcium: 7.1 mg/dL — ABNORMAL LOW (ref 8.4–10.5)
Chloride: 123 mEq/L — ABNORMAL HIGH (ref 96–112)
Chloride: 122 mEq/L — ABNORMAL HIGH (ref 96–112)
Chloride: 121 mEq/L — ABNORMAL HIGH (ref 96–112)
GFR calc Af Amer: 15 mL/min — ABNORMAL LOW (ref >90)
GFR calc Af Amer: 15 mL/min — ABNORMAL LOW (ref >90)
GFR calc Af Amer: 16 mL/min — ABNORMAL LOW (ref >90)
GFR calc non Af Amer: 13 mL/min — ABNORMAL LOW (ref >90)
GFR calc non Af Amer: 14 mL/min — ABNORMAL LOW (ref >90)
GFR, EST AFRICAN AMERICAN: 14 mL/min — AB (ref >90)
GFR, EST NON AFRICAN AMERICAN: 13 mL/min — AB (ref >90)
GFR, EST NON AFRICAN AMERICAN: 12 mL/min — AB (ref >90)
GLUCOSE: 134 mg/dL — AB (ref 70–99)
Glucose, Bld: 151 mg/dL — ABNORMAL HIGH (ref 70–99)
Glucose, Bld: 113 mg/dL — ABNORMAL HIGH (ref 70–99)
Glucose, Bld: 272 mg/dL — ABNORMAL HIGH (ref 70–99)
POTASSIUM: 4.8 meq/L (ref 3.7–5.3)
Potassium: 5 mEq/L (ref 3.7–5.3)
Potassium: 5.1 mEq/L (ref 3.7–5.3)
Potassium: 4.4 mEq/L (ref 3.7–5.3)
Sodium: 158 mEq/L — ABNORMAL HIGH (ref 137–147)
Sodium: 159 mEq/L — ABNORMAL HIGH (ref 137–147)
Sodium: 162 mEq/L — ABNORMAL HIGH (ref 137–147)
Sodium: 155 mEq/L — ABNORMAL HIGH (ref 137–147)

## 2013-12-22 LAB — POCT I-STAT 3, ART BLOOD GAS (G3+)
ACID-BASE DEFICIT: 11 mmol/L — AB (ref 0.0–2.0)
ACID-BASE DEFICIT: 9 mmol/L — AB (ref 0.0–2.0)
ACID-BASE DEFICIT: 10 mmol/L — AB (ref 0.0–2.0)
Acid-base deficit: 11 mmol/L — ABNORMAL HIGH (ref 0.0–2.0)
Acid-base deficit: 11 mmol/L — ABNORMAL HIGH (ref 0.0–2.0)
BICARBONATE: 18.3 meq/L — AB (ref 20.0–24.0)
Bicarbonate: 15 mEq/L — ABNORMAL LOW (ref 20.0–24.0)
Bicarbonate: 15.8 mEq/L — ABNORMAL LOW (ref 20.0–24.0)
Bicarbonate: 16.5 mEq/L — ABNORMAL LOW (ref 20.0–24.0)
Bicarbonate: 18 mEq/L — ABNORMAL LOW (ref 20.0–24.0)
O2 SAT: 84 %
O2 Saturation: 99 %
O2 Saturation: 89 %
O2 Saturation: 94 %
O2 Saturation: 95 %
PCO2 ART: 31.6 mmHg — AB (ref 35.0–45.0)
PH ART: 7.125 — AB (ref 7.350–7.450)
PH ART: 7.222 — AB (ref 7.350–7.450)
PH ART: 7.244 — AB (ref 7.350–7.450)
PO2 ART: 175 mmHg — AB (ref 80.0–100.0)
Patient temperature: 37
Patient temperature: 98.6
TCO2: 16 mmol/L (ref 0–100)
TCO2: 17 mmol/L (ref 0–100)
TCO2: 18 mmol/L (ref 0–100)
TCO2: 20 mmol/L (ref 0–100)
TCO2: 20 mmol/L (ref 0–100)
pCO2 arterial: 54.7 mmHg — ABNORMAL HIGH (ref 35.0–45.0)
pCO2 arterial: 38.5 mmHg (ref 35.0–45.0)
pCO2 arterial: 44.6 mmHg (ref 35.0–45.0)
pCO2 arterial: 38.3 mmHg (ref 35.0–45.0)
pH, Arterial: 7.221 — ABNORMAL LOW (ref 7.350–7.450)
pH, Arterial: 7.284 — ABNORMAL LOW (ref 7.350–7.450)
pO2, Arterial: 54 mmHg — ABNORMAL LOW (ref 80.0–100.0)
pO2, Arterial: 68 mmHg — ABNORMAL LOW (ref 80.0–100.0)
pO2, Arterial: 87 mmHg (ref 80.0–100.0)
pO2, Arterial: 88 mmHg (ref 80.0–100.0)

## 2013-12-22 LAB — URINE CULTURE
CULTURE: NO GROWTH
Colony Count: NO GROWTH

## 2013-12-22 LAB — GLUCOSE, CAPILLARY
GLUCOSE-CAPILLARY: 147 mg/dL — AB (ref 70–99)
GLUCOSE-CAPILLARY: 115 mg/dL — AB (ref 70–99)
GLUCOSE-CAPILLARY: 108 mg/dL — AB (ref 70–99)
Glucose-Capillary: 136 mg/dL — ABNORMAL HIGH (ref 70–99)
Glucose-Capillary: 214 mg/dL — ABNORMAL HIGH (ref 70–99)
Glucose-Capillary: 239 mg/dL — ABNORMAL HIGH (ref 70–99)

## 2013-12-22 LAB — URINE MICROSCOPIC-ADD ON

## 2013-12-22 LAB — CBC
HEMATOCRIT: 41.2 % (ref 39.0–52.0)
HEMATOCRIT: 43.9 % (ref 39.0–52.0)
Hemoglobin: 14.6 g/dL (ref 13.0–17.0)
Hemoglobin: 13.7 g/dL (ref 13.0–17.0)
MCH: 29.7 pg (ref 26.0–34.0)
MCH: 29.7 pg (ref 26.0–34.0)
MCHC: 33.3 g/dL (ref 30.0–36.0)
MCHC: 33.3 g/dL (ref 30.0–36.0)
MCV: 89.2 fL (ref 78.0–100.0)
MCV: 89.4 fL (ref 78.0–100.0)
Platelets: 229 10*3/uL (ref 150–400)
Platelets: 196 10*3/uL (ref 150–400)
RBC: 4.92 MIL/uL (ref 4.22–5.81)
RBC: 4.61 MIL/uL (ref 4.22–5.81)
RDW: 16.1 % — AB (ref 11.5–15.5)
RDW: 16.1 % — ABNORMAL HIGH (ref 11.5–15.5)
WBC: 7.4 10*3/uL (ref 4.0–10.5)
WBC: 8.2 10*3/uL (ref 4.0–10.5)

## 2013-12-22 LAB — PROCALCITONIN: Procalcitonin: 0.2 ng/mL

## 2013-12-22 LAB — TROPONIN I
TROPONIN I: 0.42 ng/mL — AB (ref <0.30)
Troponin I: <0.3 ng/mL (ref <0.30)

## 2013-12-22 LAB — MRSA PCR SCREENING: MRSA by PCR: NEGATIVE

## 2013-12-22 LAB — CK: Total CK: 245 U/L — ABNORMAL HIGH (ref 7–232)

## 2013-12-22 LAB — LACTIC ACID, PLASMA: Lactic Acid, Venous: 1.8 mmol/L (ref 0.5–2.2)

## 2013-12-22 LAB — PHOSPHORUS: PHOSPHORUS: 8.4 mg/dL — AB (ref 2.3–4.6)

## 2013-12-22 LAB — PRO B NATRIURETIC PEPTIDE: PRO B NATRI PEPTIDE: 418.4 pg/mL (ref 0–450)

## 2013-12-22 LAB — MAGNESIUM: Magnesium: 3 mg/dL — ABNORMAL HIGH (ref 1.5–2.5)

## 2013-12-22 MED ORDER — VITAL HIGH PROTEIN PO LIQD
1000.0000 mL | ORAL | Status: DC
  Filled 2013-12-22 (×2): qty 1000

## 2013-12-22 MED ORDER — FENTANYL CITRATE 0.05 MG/ML IJ SOLN
50.0000 ug | INTRAMUSCULAR | Status: AC | PRN
  Administered 2013-12-22 – 2013-12-26 (×3): 50 ug via INTRAVENOUS
  Filled 2013-12-22: qty 2

## 2013-12-22 MED ORDER — PRO-STAT SUGAR FREE PO LIQD
30.0000 mL | Freq: Two times a day (BID) | ORAL | Status: DC
  Administered 2013-12-22: 30 mL
  Filled 2013-12-22 (×2): qty 30

## 2013-12-22 MED ORDER — CHLORHEXIDINE GLUCONATE 0.12 % MT SOLN
15.0000 mL | Freq: Two times a day (BID) | OROMUCOSAL | Status: DC
Start: 2013-12-22 — End: 2013-12-22

## 2013-12-22 MED ORDER — ROCURONIUM BROMIDE 50 MG/5ML IV SOLN
INTRAVENOUS | Status: AC
  Filled 2013-12-22: qty 2

## 2013-12-22 MED ORDER — SUCCINYLCHOLINE CHLORIDE 20 MG/ML IJ SOLN
INTRAMUSCULAR | Status: AC
  Filled 2013-12-22: qty 1

## 2013-12-22 MED ORDER — ROCURONIUM BROMIDE 50 MG/5ML IV SOLN
INTRAVENOUS | Status: AC | PRN
  Administered 2013-12-22: 70 mg via INTRAVENOUS

## 2013-12-22 MED ORDER — ETOMIDATE 2 MG/ML IV SOLN
INTRAVENOUS | Status: AC | PRN
  Administered 2013-12-22: 20 mg via INTRAVENOUS

## 2013-12-22 MED ORDER — ETOMIDATE 2 MG/ML IV SOLN
INTRAVENOUS | Status: AC
  Filled 2013-12-22: qty 20

## 2013-12-22 MED ORDER — LIDOCAINE HCL (CARDIAC) 20 MG/ML IV SOLN
INTRAVENOUS | Status: AC
  Filled 2013-12-22: qty 5

## 2013-12-22 MED ORDER — DEXTROSE 5 % IV SOLN
1.0000 g | INTRAVENOUS | Status: DC
  Administered 2013-12-22: 1 g via INTRAVENOUS
  Filled 2013-12-22: qty 10

## 2013-12-22 MED ORDER — SODIUM CHLORIDE 0.9 % IV SOLN
25.0000 ug/h | INTRAVENOUS | Status: DC
  Administered 2013-12-22: 100 ug/h via INTRAVENOUS
  Administered 2013-12-23 – 2013-12-24 (×2): 125 ug/h via INTRAVENOUS
  Administered 2013-12-26: 200 ug/h via INTRAVENOUS
  Filled 2013-12-22 (×6): qty 50

## 2013-12-22 MED ORDER — FREE WATER
150.0000 mL | Freq: Four times a day (QID) | Status: DC
  Administered 2013-12-22 – 2013-12-26 (×15): 150 mL

## 2013-12-22 MED ORDER — VASOPRESSIN 20 UNIT/ML IJ SOLN
0.0300 [IU]/min | INTRAVENOUS | Status: DC
  Administered 2013-12-22 – 2013-12-25 (×2): 0.03 [IU]/min via INTRAVENOUS
  Filled 2013-12-22 (×3): qty 2.5

## 2013-12-22 MED ORDER — BIOTENE DRY MOUTH MT LIQD
15.0000 mL | Freq: Four times a day (QID) | OROMUCOSAL | Status: DC
  Administered 2013-12-22 – 2014-01-03 (×48): 15 mL via OROMUCOSAL

## 2013-12-22 MED ORDER — BIOTENE DRY MOUTH MT LIQD: 15.0000 mL | Freq: Four times a day (QID) | OROMUCOSAL | Status: DC

## 2013-12-22 MED ORDER — SODIUM BICARBONATE 8.4 % IV SOLN
INTRAVENOUS | Status: AC
  Filled 2013-12-22: qty 50

## 2013-12-22 MED ORDER — SODIUM CHLORIDE 0.9 % IV BOLUS (SEPSIS)
1000.0000 mL | Freq: Once | INTRAVENOUS | Status: AC
  Administered 2013-12-22: 1000 mL via INTRAVENOUS

## 2013-12-22 MED ORDER — NOREPINEPHRINE BITARTRATE 1 MG/ML IJ SOLN
2.0000 ug/min | INTRAVENOUS | Status: DC
  Administered 2013-12-22: 65 ug/min via INTRAVENOUS
  Administered 2013-12-22: 70 ug/min via INTRAVENOUS
  Administered 2013-12-23: 75 ug/min via INTRAVENOUS
  Administered 2013-12-23: 78 ug/min via INTRAVENOUS
  Administered 2013-12-23: 90 ug/min via INTRAVENOUS
  Filled 2013-12-22 (×10): qty 16

## 2013-12-22 MED ORDER — SODIUM BICARBONATE 8.4 % IV SOLN
INTRAVENOUS | Status: AC
Start: 2013-12-22 — End: 2013-12-22
  Filled 2013-12-22: qty 50

## 2013-12-22 MED ORDER — SODIUM CHLORIDE 0.9 % IV SOLN: 750.0000 mL | INTRAVENOUS | Status: DC | PRN

## 2013-12-22 MED ORDER — PANTOPRAZOLE SODIUM 40 MG IV SOLR
40.0000 mg | Freq: Every day | INTRAVENOUS | Status: DC
  Administered 2013-12-22 (×2): 40 mg via INTRAVENOUS
  Filled 2013-12-22 (×3): qty 40

## 2013-12-22 MED ORDER — CHLORHEXIDINE GLUCONATE 0.12 % MT SOLN
15.0000 mL | Freq: Two times a day (BID) | OROMUCOSAL | Status: DC
  Administered 2013-12-22 – 2014-01-03 (×24): 15 mL via OROMUCOSAL
  Filled 2013-12-22 (×27): qty 15

## 2013-12-22 MED ORDER — FENTANYL CITRATE 0.05 MG/ML IJ SOLN
50.0000 ug | INTRAMUSCULAR | Status: DC | PRN
Start: 2013-12-22 — End: 2013-12-30
  Administered 2013-12-22 – 2013-12-29 (×5): 50 ug via INTRAVENOUS
  Filled 2013-12-22 (×6): qty 2

## 2013-12-22 MED ORDER — SODIUM CHLORIDE 0.45 % IV BOLUS
1000.0000 mL | Freq: Once | INTRAVENOUS | Status: AC
  Administered 2013-12-22: 1000 mL via INTRAVENOUS

## 2013-12-22 MED ORDER — FENTANYL CITRATE 0.05 MG/ML IJ SOLN
100.0000 ug | Freq: Once | INTRAMUSCULAR | Status: AC
  Administered 2013-12-22: 100 ug via INTRAVENOUS
  Filled 2013-12-22: qty 2

## 2013-12-22 MED ORDER — SODIUM BICARBONATE 8.4 % IV SOLN
100.0000 meq | Freq: Once | INTRAVENOUS | Status: AC
  Administered 2013-12-22: 100 meq via INTRAVENOUS
  Filled 2013-12-22: qty 100

## 2013-12-22 MED ORDER — ATROPINE SULFATE 1 MG/ML IJ SOLN
INTRAMUSCULAR | Status: AC | PRN
  Administered 2013-12-22: 1 mg via INTRAVENOUS

## 2013-12-22 MED ORDER — CALCIUM CHLORIDE 10 % IV SOLN
INTRAVENOUS | Status: AC | PRN
  Administered 2013-12-22: 1 g via INTRAVENOUS

## 2013-12-22 MED ORDER — NOREPINEPHRINE BITARTRATE 1 MG/ML IJ SOLN
2.0000 ug/min | INTRAVENOUS | Status: DC
  Administered 2013-12-22: 2 ug/min via INTRAVENOUS
  Filled 2013-12-22: qty 16

## 2013-12-22 MED ORDER — SODIUM CHLORIDE 0.9 % IV SOLN
INTRAVENOUS | Status: DC
  Administered 2013-12-22 (×2): via INTRAVENOUS

## 2013-12-22 MED ORDER — MIDAZOLAM HCL 2 MG/2ML IJ SOLN
0.2500 mg | INTRAMUSCULAR | Status: DC | PRN
  Administered 2013-12-22: 0.25 mg via INTRAVENOUS
  Filled 2013-12-22: qty 2

## 2013-12-22 MED ORDER — MIDAZOLAM HCL 2 MG/2ML IJ SOLN
2.0000 mg | INTRAMUSCULAR | Status: DC | PRN
  Administered 2013-12-22 – 2013-12-24 (×2): 2 mg via INTRAVENOUS
  Filled 2013-12-22 (×2): qty 2

## 2013-12-22 MED ORDER — MIDAZOLAM HCL 2 MG/2ML IJ SOLN
2.0000 mg | Freq: Once | INTRAMUSCULAR | Status: AC
  Administered 2013-12-22: 2 mg via INTRAVENOUS
  Filled 2013-12-22: qty 2

## 2013-12-22 MED ORDER — SODIUM CHLORIDE 0.9 % IV BOLUS (SEPSIS)
500.0000 mL | Freq: Once | INTRAVENOUS | Status: AC
  Administered 2013-12-22: 500 mL via INTRAVENOUS

## 2013-12-22 MED ORDER — PIPERACILLIN-TAZOBACTAM 3.375 G IVPB 30 MIN
3.3750 g | Freq: Once | INTRAVENOUS | Status: AC
  Administered 2013-12-22: 3.375 g via INTRAVENOUS
  Filled 2013-12-22: qty 50

## 2013-12-22 MED ORDER — FENTANYL CITRATE 0.05 MG/ML IJ SOLN
50.0000 ug | Freq: Once | INTRAMUSCULAR | Status: AC
  Administered 2013-12-22: 05:00:00 via INTRAVENOUS
  Filled 2013-12-22: qty 2

## 2013-12-22 MED ORDER — VITAL HIGH PROTEIN PO LIQD
1000.0000 mL | ORAL | Status: DC
  Administered 2013-12-22 – 2013-12-23 (×2): 1000 mL
  Filled 2013-12-22 (×3): qty 1000

## 2013-12-22 MED ORDER — INSULIN ASPART 100 UNIT/ML ~~LOC~~ SOLN
0.0000 [IU] | SUBCUTANEOUS | Status: DC
  Administered 2013-12-22 (×2): 3 [IU] via SUBCUTANEOUS

## 2013-12-22 MED ORDER — SODIUM CHLORIDE 0.45 % IV SOLN
INTRAVENOUS | Status: DC
  Administered 2013-12-22: 11:00:00 via INTRAVENOUS

## 2013-12-22 MED ORDER — SODIUM CHLORIDE 0.9 % IV SOLN
1.5000 g | Freq: Two times a day (BID) | INTRAVENOUS | Status: DC
  Administered 2013-12-22 – 2013-12-23 (×3): 1.5 g via INTRAVENOUS
  Filled 2013-12-22 (×5): qty 1.5

## 2013-12-22 MED ORDER — DEXTROSE 5 % IV SOLN
INTRAVENOUS | Status: DC
  Administered 2013-12-22: 16:00:00 via INTRAVENOUS

## 2013-12-22 MED ORDER — COLLAGENASE 250 UNIT/GM EX OINT
TOPICAL_OINTMENT | Freq: Every day | CUTANEOUS | Status: DC
  Administered 2013-12-22: 18:00:00 via TOPICAL
  Administered 2013-12-24: 1 via TOPICAL
  Administered 2013-12-25 – 2013-12-30 (×6): via TOPICAL
  Administered 2013-12-31: 1 via TOPICAL
  Administered 2014-01-01 – 2014-01-03 (×3): via TOPICAL
  Filled 2013-12-22: qty 30

## 2013-12-22 MED FILL — Medication: Qty: 1 | Status: AC

## 2013-12-22 NOTE — Progress Notes (Signed)
Name: Shane Pope MRN: [id number] DOB: Sep 03, 1931    ADMISSION DATE:  12/21/2013 CONSULTATION DATE:  2/16  REFERRING MD :  [facility]  PRIMARY SERVICE: PCCM   CHIEF COMPLAINT:  Acute renal failure, resp failure  BRIEF PATIENT DESCRIPTION:  80 year old male found on floor by family. Confused, unable to get up. In ER found to be hypotensive and in acute renal failure w/ profound metabolic derangements. PCCM asked to admit   SIGNIFICANT EVENTS / STUDIES:  Lumbar spine 2/16: no acute fx Thoracic spine 2/16: no acute fx, old L1 compression fx w/ vertebroplasty  CT head 2/16: negative  Pelvis 2/16: neg   LINES / TUBES: 2/16 rt ij>>> 2/16 ett>>>  CULTURES: BCX 2 2/16>>> UC 2/16>>>  ANTIBIOTICS: 2/16 unasyn>>> 2/16 ceftriaxone>>>2/17  SUBJECTIVE:  aggitation Remains on pressors  VITAL SIGNS: Temp:  [93.6 F (34.2 C)-98.6 F (37 C)] 98.4 F (36.9 C) (02/17 0815) Pulse Rate:  [92-135] 109 (02/17 1030) Resp:  [16-37] 35 (02/17 1030) BP: (66-159)/(30-96) 91/46 mmHg (02/17 1030) SpO2:  [87 %-100 %] 91 % (02/17 1030) FiO2 (%):  [50 %-100 %] 80 % (02/17 0827) Weight:  [74.8 kg (164 lb 14.5 oz)] 74.8 kg (164 lb 14.5 oz) (02/17 0107) HEMODYNAMICS: CVP:  [7 mmHg-11 mmHg] 7 mmHg VENTILATOR SETTINGS: Vent Mode:  [-] PRVC FiO2 (%):  [50 %-100 %] 80 % Set Rate:  [16 bmp-25 bmp] 25 bmp Vt Set:  [500 mL-590 mL] 590 mL PEEP:  [5 cmH20] 5 cmH20 Plateau Pressure:  [12 cmH20-18 cmH20] 18 cmH20 INTAKE / OUTPUT: Intake/Output     02/16 0701 - 02/17 0700 02/17 0701 - 02/18 0700   I.V. (mL/kg) 3494.9 (46.7)    Total Intake(mL/kg) 3494.9 (46.7)    Urine (mL/kg/hr) 900 75 (0.3)   Total Output 900 75   Net +2594.9 -75        Stool Occurrence 1 x      PHYSICAL EXAMINATION: General:  Weak, frail elderly male, rass - 2 Neuro:  Awake,w eak, f/c, follows commands, rass - 2 HEENT:  Very dry mucous membranes.line clean Lungs:  CTA Abdomen:  Non-tender + bowel sounds, no OM   Musculoskeletal: low muscle mass  Skin:  Non-stagable pressure ulcers on sacrun and left arm. LE cool   LABS:  CBC  Recent Labs Lab 12/21/13 2138 12/21/13 2149 12/22/13 0200  WBC 8.6  --  7.4  8.2  HGB 16.4 16.3 13.7  14.6  HCT 47.6 48.0 41.2  43.9  PLT 216  --  196  229   Coag's No results found for this basename: APTT, INR,  in the last 168 hours BMET  Recent Labs Lab 12/21/13 2138 12/21/13 2149 12/22/13 0200  NA 159* 158* 159*  158*  K 4.6 4.5 5.1  5.0  CL 118* 122* 123*  121*  CO2 14*  --  16*  17*  BUN 175* >140* 163*  165*  CREATININE 3.99* 4.20* 4.06*  4.01*  GLUCOSE 121* 119* 134*  151*   Electrolytes  Recent Labs Lab 12/21/13 2138 12/22/13 0200  CALCIUM 7.7* 7.6*  8.3*  MG  --  3.0*  PHOS  --  8.4*   Sepsis Markers  Recent Labs Lab 12/21/13 2359  PROCALCITON 0.20   ABG  Recent Labs Lab 12/22/13 0029 12/22/13 0317 12/22/13 0441  PHART 7.284* 7.125* 7.222*  PCO2ART 31.6* 54.7* 38.5  PO2ART 54.0* 175.0* 68.0*   Liver Enzymes  Recent Labs Lab 12/21/13 2138  AST  31  ALT 28  ALKPHOS 51  BILITOT 1.0  ALBUMIN 2.6*   Cardiac Enzymes  Recent Labs Lab 12/21/13 2138 12/22/13 0416 12/22/13 0425  TROPONINI <0.30 <0.30  --   PROBNP  --   --  418.4   Glucose  Recent Labs Lab 12/22/13 0239 12/22/13 0336 12/22/13 0746  GLUCAP 136* 147* 115*    Imaging Dg Thoracic Spine 2 View  12/21/2013   CLINICAL DATA:  Fall, back pain.  EXAM: THORACIC SPINE - 2 VIEW  COMPARISON:  03/31/2013  FINDINGS: There is an old severe compression fracture noted at what appears to be the L1 vertebral body with vertebroplasty changes. No acute fracture. Diffuse osteopenia. Normal alignment.  IMPRESSION: No acute fracture. Old L1 compression fracture with prior vertebroplasty.   Electronically Signed   By: Rolm Baptise M.D.   On: 12/21/2013 22:33   Dg Lumbar Spine 2-3 Views  12/21/2013   CLINICAL DATA:  Back pain.  Fall.  EXAM: LUMBAR SPINE -  2-3 VIEW  COMPARISON:  MR L SPINE W/O dated 04/18/2013; DG THORACOLUMBAR SPINE 2V dated 03/31/2013  FINDINGS: Old severe compression fracture at L1, status post vertebroplasty. Stable mild compression through the inferior endplate of Z99. No acute fracture. Normal alignment. Degenerative facet disease in the lower lumbar spine.  Aortic and iliac calcifications without visible aneurysm.  IMPRESSION: No acute bony abnormality.   Electronically Signed   By: Rolm Baptise M.D.   On: 12/21/2013 22:35   Dg Pelvis 1-2 Views  12/21/2013   CLINICAL DATA:  Weakness, fall  EXAM: PELVIS - 1-2 VIEW  COMPARISON:  None.  FINDINGS: The exam is oblique due to patient motion. Hips are located. No evidence of pelvic fracture or sacral fracture. Marland Kitchen  IMPRESSION: No acute osseous abnormality.  Sacrum poorly evaluate   Electronically Signed   By: Suzy Bouchard M.D.   On: 12/21/2013 22:25   Ct Head Wo Contrast  12/21/2013   CLINICAL DATA:  Found down.  EXAM: CT HEAD WITHOUT CONTRAST  CT CERVICAL SPINE WITHOUT CONTRAST  TECHNIQUE: Multidetector CT imaging of the head and cervical spine was performed following the standard protocol without intravenous contrast. Multiplanar CT image reconstructions of the cervical spine were also generated.  COMPARISON:  None.  FINDINGS: CT HEAD FINDINGS  Diffuse cerebral atrophy. No acute intracranial abnormality. Specifically, no hemorrhage, hydrocephalus, mass lesion, acute infarction, or significant intracranial injury. No acute calvarial abnormality.  Mucosal thickening throughout the paranasal sinuses. Air-fluid levels noted in the right maxillary sinus and sphenoid sinus. Mastoid air cells are clear.  CT CERVICAL SPINE FINDINGS  Image quality is degraded by motion. No evidence of malalignment. No definite fracture. Prevertebral soft tissues are normal. Layering frothy material noted within the oropharynx and nasopharynx posteriorly. No epidural or paraspinal hematoma.  Diffuse enlargement of the  thyroid compatible with goiter.  IMPRESSION: No acute intracranial abnormality.  No acute bony abnormality in the cervical spine.  Frothy material layering within the airway.   Electronically Signed   By: Rolm Baptise M.D.   On: 12/21/2013 22:11   Ct Cervical Spine Wo Contrast  12/21/2013   CLINICAL DATA:  Found down.  EXAM: CT HEAD WITHOUT CONTRAST  CT CERVICAL SPINE WITHOUT CONTRAST  TECHNIQUE: Multidetector CT imaging of the head and cervical spine was performed following the standard protocol without intravenous contrast. Multiplanar CT image reconstructions of the cervical spine were also generated.  COMPARISON:  None.  FINDINGS: CT HEAD FINDINGS  Diffuse cerebral atrophy. No acute  intracranial abnormality. Specifically, no hemorrhage, hydrocephalus, mass lesion, acute infarction, or significant intracranial injury. No acute calvarial abnormality.  Mucosal thickening throughout the paranasal sinuses. Air-fluid levels noted in the right maxillary sinus and sphenoid sinus. Mastoid air cells are clear.  CT CERVICAL SPINE FINDINGS  Image quality is degraded by motion. No evidence of malalignment. No definite fracture. Prevertebral soft tissues are normal. Layering frothy material noted within the oropharynx and nasopharynx posteriorly. No epidural or paraspinal hematoma.  Diffuse enlargement of the thyroid compatible with goiter.  IMPRESSION: No acute intracranial abnormality.  No acute bony abnormality in the cervical spine.  Frothy material layering within the airway.   Electronically Signed   By: Rolm Baptise M.D.   On: 12/21/2013 22:11   Dg Chest Port 1 View  12/22/2013   CLINICAL DATA:  Central line and orogastric tube placement  EXAM: PORTABLE CHEST - 1 VIEW  COMPARISON:  Chest radiograph December 21, 2013  FINDINGS: Interval intubation, distal tip projects approximately 6.8 cm above the carina. New right internal jugular central venous catheter with distal tip projecting in mid superior vena cava.  Nasogastric tube projects in proximal stomach with side port immediately distal to the gastroesophageal junction region. Multiple EKG lines overlie the patient and may obscure subtle underlying pathology. Multiple pacer pads overlie the patient.  The cardiac silhouette is upper limits of normal in size. Similar mild interstitial changes without pleural effusions or focal consolidations. No pneumothorax. Soft tissue planes and included osseous structures are unchanged.  IMPRESSION: Endotracheal tube tip projects 6.8 cm above the carina, consider 1-2 cm of advancement. New right IJ line with distal tip in mid superior vena cava, no pneumothorax. Orogastric tube past the gastroesophageal junction, distal tip projecting in proximal stomach.  Borderline cardiomegaly and similar mild interstitial prominence.   Electronically Signed   By: Elon Alas   On: 12/22/2013 02:08   Dg Chest Port 1 View  12/21/2013   CLINICAL DATA:  Weakness  EXAM: PORTABLE CHEST - 1 VIEW  COMPARISON:  03/27/2012  FINDINGS: Cardiac shadow is stable and mildly enlarged. Diffuse interstitial changes are again identified and stable. No focal infiltrate or sizable effusion is seen. No acute bony abnormality is noted.  IMPRESSION: Chronic changes without acute abnormality.   Electronically Signed   By: Inez Catalina M.D.   On: 12/21/2013 21:31     CXR: chronic int changes?, ett wnl  ASSESSMENT / PLAN:  PULMONARY A: Acute resp failure R/o aspiration  P:   ABG reviewed, maintain 8 cc/kg, may need futher peep increase to reduce to 60% goal, rate increase 30, abg to follow abg in am  pcxr in am  No weaning with high MV and shock state Correct met acidosis  CARDIOVASCULAR A: Hypovolemia, shock, r/o septic P:  cvp 7 , bolus further, increase volume Continue to allow pos balance Lactic acid level now  RENAL A:   Acute renal failure: in setting of dehydration. He was on ace-I and NSAID, ATN likely  Metabolic acidosis + anion  gap (corrected AG 30.5 for albumin). Likely in setting of uremia  Hypernatremia  Hyperchloremia Dehydration    P:   IV hydration , to 1/2 NS at 150, bmet q8h Hold antihypertensives  Repeat total CKs, awaited  Send urine myoglobin  strick I&O  Renal US   GASTROINTESTINAL A:  NPO, no GI bleed noted P:    Start  TF Needs ppi , mandatory   HEMATOLOGIC A:  Hemoconcentration. His hgb 1 year  ago was 14.3, admitted w/ 16.4      R/O UGIB: has black tarry stools, HCt stable P:  Repeat CBC in am  Hold [place] heparin, consider add in am  PPI   INFECTIOUS A:  R/o aspiration, decub / arm wound P:   Ck PCT Trend fever curve Consider aspiration unasyn Dc ceftriaxone Wound care esnure good pulses to arm  ENDOCRINE A:  H/O DM  P:   cbg q4, start ssi if >150  Assess tsh  NEUROLOGIC A:  Mild acute encephalopathy.  Seems to be improving w/ IVFs. His mouth is so dry it's difficult to understand his speech P:   fent Versed added rass to goal -1  DERM A: sacral pressure ulcer: unstageable      Pressure ulcer right arm: unstageable  P: WON consult   TODAY'S SUMMARY: fent, versed needed, increase pos balance, wean pressors as cvp rises, renal US, change to 1/2 NS  I have personally obtained a history, examined the patient, evaluated laboratory and imaging results, formulated the assessment and plan and placed orders.  CRITICAL CARE: The patient is critically ill with multiple organ systems failure and requires high complexity decision making for assessment and support, frequent evaluation and titration of therapies, application of advanced monitoring technologies and extensive interpretation of multiple databases. Critical Care Time devoted to patient care services described in this note is 40 minutes.   Lavon Paganini. Titus Mould, MD, [place] Pgr: [facility]

## 2013-12-22 NOTE — Procedures (Signed)
Central Venous Catheter Insertion Procedure Note Shane Pope [id number] 01/06/1931  Procedure: Insertion of Central Venous Catheter Indications: Assessment of intravascular volume, Drug and/or fluid administration and Frequent blood sampling  Procedure Details Consent: Risks of procedure as well as the alternatives and risks of each were explained to the (patient/caregiver).  Consent for procedure obtained. Time Out: Verified patient identification, verified procedure, site/side was marked, verified correct patient position, special equipment/implants available, medications/allergies/relevent history reviewed, required imaging and test results available.  Performed  Maximum sterile technique was used including antiseptics, cap, gloves, gown, hand hygiene, mask and sheet. Skin prep: Chlorhexidine; local anesthetic administered A antimicrobial bonded/coated triple lumen catheter was placed in the right internal jugular vein using the Seldinger technique.  Evaluation Blood flow good Complications: No apparent complications Patient did tolerate procedure well. Chest X-ray ordered to verify placement.  CXR: normal.  Shane Pope,PETE 12/22/2013, 3:32 AM

## 2013-12-22 NOTE — ED Notes (Signed)
Intubated. Per EDP.

## 2013-12-22 NOTE — ED Notes (Signed)
EDP at bed side for resp. Arrest.

## 2013-12-22 NOTE — Progress Notes (Signed)
Interval progress note  Patients EKG reveals a LBBB. Upon review of previous EKG from 2011 this is determined to be an old finding. First troponin is negative. Will continue to monitor and f/u cycling of troponins.  Tommi Rumps, MD [place] PGY-2

## 2013-12-22 NOTE — Progress Notes (Signed)
[name] Progress Note Patient Name: Shane Pope DOB: 1931/06/04 MRN: [id number]  Date of Service  12/22/2013   HPI/Events of Note  Prompted by bedside RN to discuss situation with daughter Shane Pope.  Shane Pope tells me that [name] would not want heroic measures and certainly would not want prolonged life support or to live in a nursing home.     eICU Interventions  I explained to her that at this point this isn't considered an uncurable condition, but that in general older adults who are very ill on ventilators do not do well with CPR.  Shane Pope felt that the best approach was to provide full medical support but not give CPR or electric shocks if needed.  Limited code order written.   Intervention Category Major Interventions: End of life / care limitation discussion  MCQUAID, DOUGLAS 12/22/2013, 6:40 PM

## 2013-12-22 NOTE — Progress Notes (Signed)
INITIAL NUTRITION ASSESSMENT  DOCUMENTATION CODES Per approved criteria  -Non-severe (moderate) malnutrition in the context of chronic illness   INTERVENTION:  Utilize 79M PEPuP Protocol:  Initiate TF via OGT with Vital AF 1.2 at 25 ml/hr on day 1; on day 2 increase to goal rate of 70 ml/hr (1680 ml per day) to provide 2016 kcal (99% of estimated needs), 126 grams protein (100% of estimated needs), and 1362 ml free water.   NUTRITION DIAGNOSIS: Inadequate oral intake related to inability to eat as evidenced by NPO status.   Goal: Patient to meet >/= 90% of estimated nutrition needs  Monitor:  Vent status, TF initiation, weight trends, lab trends  Reason for Assessment: New Ventilator; MD Consult for TF initiation and management.  80 y.o. male  Admitting Dx: Acute Renal Failure  ASSESSMENT: 80 year old male found on floor by family. Confused, unable to get up. In ER found to be hypotensive and in acute renal failure w/ profound metabolic derangements. Hypothermic on arrival 91 degrees.  Patient intubated on 2/17.  Dietetic intern had brief discussion with family member at bedside. Per family member, patient's usual weight is 160-165 lb; patient lost 40-60 lb after knee surgery in May 2013.   Patient currently intubated on ventilator support. MV: 18 L/min Temp (24hrs), Avg:98.1 F (36.7 C), Min:93.6 F (34.2 C), Max:98.6 F (37 C)   Nutrition Focused Physical Exam:  Subcutaneous Fat:  Orbital Region: mild depletion Upper Arm Region: moderate depletion Thoracic and Lumbar Region: moderate depletion  Muscle:  Temple Region: moderate depletion Clavicle Bone Region: moderate depletion Clavicle and Acromion Bone Region: moderate depletion Scapular Bone Region: N/A Dorsal Hand: moderate depletion Patellar Region: WNL Anterior Thigh Region: WNL Posterior Calf Region: N/A  Edema: none noted  Patient meets criteria for non-severe malnutrition in the context of chronic  illness as evidenced by moderate body fat and muscle mass depletion.   Height: Ht Readings from Last 1 Encounters:  12/22/13 6' 0.83" (1.85 m)    Weight: Wt Readings from Last 1 Encounters:  12/22/13 164 lb 14.5 oz (74.8 kg)    Ideal Body Weight: 184 lb (83.6 kg)  % Ideal Body Weight: 89%  Wt Readings from Last 10 Encounters:  12/22/13 164 lb 14.5 oz (74.8 kg)  12/10/12 165 lb (74.844 kg)  03/24/12 205 lb (92.987 kg)  03/24/12 205 lb (92.987 kg)  03/13/12 221 lb 12.8 oz (100.608 kg)  04/17/10 229 lb (103.874 kg)  04/28/08 231 lb 4 oz (104.894 kg)    Usual Body Weight: 165 lb  % Usual Body Weight: 99%   BMI:  Body mass index is 21.86 kg/(m^2).  Estimated Nutritional Needs: Kcal: 2025 Protein: 115-125 grams Fluid: >2 L  Skin: 2 unstageable sacral pressure ulcers, deep tissue injury pressure ulcer on left elbow  Diet Order: NPO  EDUCATION NEEDS: -Education not appropriate at this time   Intake/Output Summary (Last 24 hours) at 12/22/13 1023 Last data filed at 12/22/13 0903  Gross per 24 hour  Intake 3494.88 ml  Output    975 ml  Net 2519.88 ml    Last BM: 2/17  Labs:   Recent Labs Lab 12/21/13 2138 12/21/13 2149 12/22/13 0200  NA 159* 158* 159*  158*  K 4.6 4.5 5.1  5.0  CL 118* 122* 123*  121*  CO2 14*  --  16*  17*  BUN 175* >140* 163*  165*  CREATININE 3.99* 4.20* 4.06*  4.01*  CALCIUM 7.7*  --  7.6*  8.3*  MG  --   --  3.0*  PHOS  --   --  8.4*  GLUCOSE 121* 119* 134*  151*    CBG (last 3)   Recent Labs  12/22/13 0239 12/22/13 0336 12/22/13 0746  GLUCAP 136* 147* 115*    Scheduled Meds: . ampicillin-sulbactam (UNASYN) IV  1.5 g Intravenous Q12H  . antiseptic oral rinse  15 mL Mouth Rinse QID  . cefTRIAXone (ROCEPHIN)  IV  1 g Intravenous Q24H  . chlorhexidine  15 mL Mouth Rinse BID  . etomidate      . fentaNYL  100 mcg Intravenous Once  . lidocaine (cardiac) 100 mg/34ml      . midazolam  2 mg Intravenous Once  .  pantoprazole (PROTONIX) IV  40 mg Intravenous QHS  . rocuronium      . sodium chloride  1,000 mL Intravenous Once  . succinylcholine        Continuous Infusions: . sodium chloride 125 mL/hr at 12/22/13 0305  . fentaNYL infusion INTRAVENOUS    . norepinephrine (LEVOPHED) Adult infusion 15 mcg/min (12/22/13 1000)    Past Medical History  Diagnosis Date  . Hypertension   . Diabetes mellitus   . Anemia   . Blood transfusion     hx of   . GERD (gastroesophageal reflux disease)   . Arthritis   . 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 Fracture     hx of right wrist fracture   . Fracture, ribs     hx of on right side   . Pneumothorax on right   . PVD (peripheral vascular disease)   . Hyperlipidemia     Past Surgical History  Procedure Laterality Date  . Hernia repair      right inguinal hernia repair   . Other surgical history      undescended testicle surgery  . Cholecystectomy    . Hemorrhoid surgery    . Eye surgery      bilateral cataract surgery  . Knee arthroscopy      right knee   . Appendectomy    . Total knee arthroplasty  03/24/2012    Procedure: TOTAL KNEE ARTHROPLASTY;  Surgeon: Gearlean Alf, MD;  Location: [facility];  Service: Orthopedics;  Laterality: Right;    Claudell Kyle, Dietetic Intern Pager: [phone]  I agree with student dietitian note; appropriate revisions have been made.  Molli Barrows, RD, LDN, [place] Pager# [phone] After Hours Pager# [phone]

## 2013-12-22 NOTE — Significant Event (Addendum)
Called emergently to ER. EDP establishing airway. Pt had occluded airway w/ large mucous secretion. Afterward was able to remove with Miguel clamps using glide scope to assist with visualization. . Impression  Acute respiratory failure in setting of occluded airway from secretions  Aspiration PNA Brief PEA. Respiratory mediated (<30 sec).  Hypovolemic shock  Acute renal failure  Metabolic acidosis  Family updated at the bedside.   Plan Full vent support PAD protocol  resp culture Empiric unasyn Transduce CVP F/u chemistry and CBC  Cont IVFs    Marni Griffon ACNP-BC [place] Pager # [phone] OR # [phone] if no answer

## 2013-12-22 NOTE — Consult Note (Addendum)
WOC wound consult note Reason for Consult:Pressure ulcer on sacrum and tissue trauma to left arm (UE).  Patient was found "down". Wound type:Pressure and trauma Pressure Ulcer POA: Yes Measurement:3cm x 6cm area of hard, adherent black eschar on sacrum.  Three small areas (the largest measures 2cm round) are covered with soft yellow slough and are dissolving via autolysis under silicone dressings. Wound bed:As described above. Drainage (amount, consistency, odor) None Periwound:Intact.  Periwound bruising on left upper extremity Dressing procedure/placement/frequency:I have provided orders for application of a soft silicone dressing to the upper left extremity and use of an enzymatic debriding agent to the sacrum.  Prevalon pressure redistribution boots are provided to the bilateral heels to mitigate risk in that area.  As patient progresses, it may be indicated to involve PT and hydrotherapy for the sacral wound, but he is currently quite hemodynamically unstable so I will postpone that intervention for now. If condition improves and you wish to explore that option, please either reconsult or order hydrotherapy. [place] nursing team will not follow, but will remain available to this patient, the nursing and medical team.  Please re-consult if needed. Thanks, Maudie Flakes, MSN, RN, [place], [place], [place] [phone])

## 2013-12-22 NOTE — Progress Notes (Signed)
80yo male lives alone and previously independent, found on floor by family today w/ AMS, last contact was Thursday; several profound metabolic derangements and in ARF, to begin IV ABX for risk of aspiration.  Will start Unasyn 1.5g IV Q12H for CrCl ~15 ml/min and monitor renal function for adjustments.  Wynona Neat, PharmD, BCPS 12/22/2013 2:00 AM

## 2013-12-22 NOTE — ED Notes (Signed)
CALL Daughter Magda Paganini [phone]

## 2013-12-22 NOTE — Progress Notes (Signed)
CRITICAL VALUE ALERT  Critical value received:  .0.42, Troponin  Date of notification:  12/22/2013  Time of notification:  1215  Critical value read back: yes  Nurse who received alert:  D. Augustin Coupe, RN  MD notified (1st page):   Dr. Titus Mould  Time of first page:  1215  MD notified (2nd page):  Time of second page:  Responding MD:  n/a  Time MD responded:  n/a

## 2013-12-22 NOTE — Progress Notes (Addendum)
Interval Progress Note  Received call from nursing. Patient with MAP near 45, O2 sats in the low 80's. Patient with tachycardia on exam and coarse breath sounds. On monitor there appears to be ST segment changes. Oxygen improved with suction to 97%. Will start on levophed for pressure support. Will check EKG and cycle troponin. Will give 2 amps bicarb given abg with pH 7.125. Will continue to monitor. Discussed with Dr Jimmy Footman.  Tommi Rumps, MD [place] PGY-2

## 2013-12-22 NOTE — Progress Notes (Signed)
eLink Physician-Brief Progress Note Patient Name: Derryck Shahan DOB: 1931-05-03 MRN: [id number]  Date of Service  12/22/2013   HPI/Events of Note   Shock, only 3.5 L positive after being down for days Mild acidosis Hypernatremia  eICU Interventions  Bolus 500cc NS now Watch Na> repeat BMET   Intervention Category Major Interventions: Acid-Base disturbance - evaluation and management;Shock - evaluation and management  Shlonda Dolloff 12/22/2013, 4:19 PM

## 2013-12-22 NOTE — Progress Notes (Signed)
PCCM   Recent Labs Lab 12/21/13 2138 12/21/13 2149 12/22/13 0200 12/22/13 1200  NA 159* 158* 159*  158* 162*  K 4.6 4.5 5.1  5.0 4.8  CL 118* 122* 123*  121* 122*  CO2 14*  --  16*  17* 18*  GLUCOSE 121* 119* 134*  151* 113*  BUN 175* >140* 163*  165* 163*  CREATININE 3.99* 4.20* 4.06*  4.01* 4.20*  CALCIUM 7.7*  --  7.6*  8.3* 7.7*  MG  --   --  3.0*  --   PHOS  --   --  8.4*  --     Recent Labs Lab 12/21/13 2149 12/22/13 0029 12/22/13 0317 12/22/13 0441 12/22/13 1302  PHART  --  7.284* 7.125* 7.222* 7.221*  PCO2ART  --  31.6* 54.7* 38.5 44.6  PO2ART  --  54.0* 175.0* 68.0* 87.0  HCO3  --  15.0* 18.0* 15.8* 18.3*  TCO2 17 16 20 17 20   O2SAT  --  84.0 99.0 89.0 94.0     Na rising  Plan: Change to D5W and free water Also adjust vent for acidosis  Asencion Noble

## 2013-12-22 NOTE — Procedures (Signed)
Arterial Catheter Insertion Procedure Note Shane Pope [id number] [birth year]  Procedure: Insertion of Arterial Catheter  Indications: Blood pressure monitoring and Frequent blood sampling  Procedure Details Consent: Risks of procedure as well as the alternatives and risks of each were explained to the (patient/caregiver).  Consent for procedure obtained. and Unable to obtain consent because of altered level of consciousness. Time Out: Verified patient identification, verified procedure, site/side was marked, verified correct patient position, special equipment/implants available, medications/allergies/relevent history reviewed, required imaging and test results available.  Performed  Maximum sterile technique was used including antiseptics, cap, gloves, gown, hand hygiene, mask and sheet. Skin prep: Chlorhexidine; local anesthetic administered 20 gauge catheter was inserted into right radial artery using the Seldinger technique.  Evaluation Blood flow good; BP tracing good. Complications: No apparent complications.   Phillis Knack [facility] 12/22/2013

## 2013-12-23 ENCOUNTER — Inpatient Hospital Stay (HOSPITAL_COMMUNITY): Payer: Medicare Other

## 2013-12-23 DIAGNOSIS — S32009A Unspecified fracture of unspecified lumbar vertebra, initial encounter for closed fracture: Secondary | ICD-10-CM

## 2013-12-23 DIAGNOSIS — I517 Cardiomegaly: Secondary | ICD-10-CM

## 2013-12-23 DIAGNOSIS — R9431 Abnormal electrocardiogram [ECG] [EKG]: Secondary | ICD-10-CM

## 2013-12-23 LAB — BASIC METABOLIC PANEL
BUN: 141 mg/dL — ABNORMAL HIGH (ref 6–23)
BUN: 125 mg/dL — ABNORMAL HIGH (ref 6–23)
BUN: 106 mg/dL — AB (ref 6–23)
CALCIUM: 7.1 mg/dL — AB (ref 8.4–10.5)
CALCIUM: 6.5 mg/dL — AB (ref 8.4–10.5)
CHLORIDE: 112 meq/L (ref 96–112)
CO2: 16 mEq/L — ABNORMAL LOW (ref 19–32)
CO2: 19 mEq/L (ref 19–32)
CO2: 19 meq/L (ref 19–32)
Calcium: 6.9 mg/dL — ABNORMAL LOW (ref 8.4–10.5)
Chloride: 118 mEq/L — ABNORMAL HIGH (ref 96–112)
Chloride: 115 mEq/L — ABNORMAL HIGH (ref 96–112)
Creatinine, Ser: 3.03 mg/dL — ABNORMAL HIGH (ref 0.50–1.35)
Creatinine, Ser: 2.57 mg/dL — ABNORMAL HIGH (ref 0.50–1.35)
Creatinine, Ser: 2.03 mg/dL — ABNORMAL HIGH (ref 0.50–1.35)
GFR calc Af Amer: 21 mL/min — ABNORMAL LOW (ref >90)
GFR calc Af Amer: 26 mL/min — ABNORMAL LOW (ref >90)
GFR calc Af Amer: 34 mL/min — ABNORMAL LOW (ref >90)
GFR calc non Af Amer: 22 mL/min — ABNORMAL LOW (ref >90)
GFR calc non Af Amer: 29 mL/min — ABNORMAL LOW (ref >90)
GFR, EST NON AFRICAN AMERICAN: 18 mL/min — AB (ref >90)
Glucose, Bld: 265 mg/dL — ABNORMAL HIGH (ref 70–99)
Glucose, Bld: 216 mg/dL — ABNORMAL HIGH (ref 70–99)
Glucose, Bld: 300 mg/dL — ABNORMAL HIGH (ref 70–99)
Potassium: 4.8 mEq/L (ref 3.7–5.3)
Potassium: 4.1 mEq/L (ref 3.7–5.3)
Potassium: 3.7 mEq/L (ref 3.7–5.3)
SODIUM: 149 meq/L — AB (ref 137–147)
Sodium: 148 mEq/L — ABNORMAL HIGH (ref 137–147)
Sodium: 145 mEq/L (ref 137–147)

## 2013-12-23 LAB — BLOOD GAS, ARTERIAL
Acid-base deficit: 8.6 mmol/L — ABNORMAL HIGH (ref 0.0–2.0)
Bicarbonate: 16.8 mEq/L — ABNORMAL LOW (ref 20.0–24.0)
Drawn by: 39899
FIO2: 0.5 %
O2 SAT: 98.8 %
PEEP: 8 cmH2O
PH ART: 7.282 — AB (ref 7.350–7.450)
Patient temperature: 98.6
RATE: 35 resp/min
TCO2: 18 mmol/L (ref 0–100)
VT: 590 mL
pCO2 arterial: 36.9 mmHg (ref 35.0–45.0)
pO2, Arterial: 123 mmHg — ABNORMAL HIGH (ref 80.0–100.0)

## 2013-12-23 LAB — CARBOXYHEMOGLOBIN
Carboxyhemoglobin: 1.7 % — ABNORMAL HIGH (ref 0.5–1.5)
Methemoglobin: 1.3 % (ref 0.0–1.5)
O2 Saturation: 84.5 %
Total hemoglobin: 8.3 g/dL — ABNORMAL LOW (ref 13.5–18.0)

## 2013-12-23 LAB — CBC
HEMATOCRIT: 44.8 % (ref 39.0–52.0)
Hemoglobin: 14.4 g/dL (ref 13.0–17.0)
MCH: 28.8 pg (ref 26.0–34.0)
MCHC: 32.1 g/dL (ref 30.0–36.0)
MCV: 89.6 fL (ref 78.0–100.0)
Platelets: 292 10*3/uL (ref 150–400)
RBC: 5 MIL/uL (ref 4.22–5.81)
RDW: 16.8 % — AB (ref 11.5–15.5)
WBC: 13.6 10*3/uL — ABNORMAL HIGH (ref 4.0–10.5)

## 2013-12-23 LAB — TSH: TSH: 0.816 u[IU]/mL (ref 0.350–4.500)

## 2013-12-23 LAB — MAGNESIUM: Magnesium: 2.5 mg/dL (ref 1.5–2.5)

## 2013-12-23 LAB — GLUCOSE, CAPILLARY
GLUCOSE-CAPILLARY: 218 mg/dL — AB (ref 70–99)
Glucose-Capillary: 200 mg/dL — ABNORMAL HIGH (ref 70–99)
Glucose-Capillary: 212 mg/dL — ABNORMAL HIGH (ref 70–99)
Glucose-Capillary: 257 mg/dL — ABNORMAL HIGH (ref 70–99)
Glucose-Capillary: 270 mg/dL — ABNORMAL HIGH (ref 70–99)
Glucose-Capillary: 272 mg/dL — ABNORMAL HIGH (ref 70–99)
Glucose-Capillary: 275 mg/dL — ABNORMAL HIGH (ref 70–99)

## 2013-12-23 LAB — PHOSPHORUS: PHOSPHORUS: 4.2 mg/dL (ref 2.3–4.6)

## 2013-12-23 LAB — CORTISOL
Cortisol, Plasma: 33.2 ug/dL
Cortisol, Plasma: 22.4 ug/dL

## 2013-12-23 MED ORDER — BIOTENE DRY MOUTH MT LIQD: 15.0000 mL | Freq: Four times a day (QID) | OROMUCOSAL | Status: DC

## 2013-12-23 MED ORDER — SODIUM BICARBONATE 8.4 % IV SOLN
INTRAVENOUS | Status: DC
  Administered 2013-12-23 (×2): via INTRAVENOUS
  Filled 2013-12-23 (×6): qty 100

## 2013-12-23 MED ORDER — VITAL AF 1.2 CAL PO LIQD
1000.0000 mL | ORAL | Status: DC
  Administered 2013-12-23 – 2013-12-26 (×4): 1000 mL
  Filled 2013-12-23 (×12): qty 1000

## 2013-12-23 MED ORDER — PANTOPRAZOLE SODIUM 40 MG PO PACK
40.0000 mg | PACK | Freq: Two times a day (BID) | ORAL | Status: DC
  Administered 2013-12-23 – 2013-12-26 (×7): 40 mg
  Filled 2013-12-23 (×13): qty 20

## 2013-12-23 MED ORDER — INSULIN ASPART 100 UNIT/ML ~~LOC~~ SOLN
0.0000 [IU] | SUBCUTANEOUS | Status: DC
  Administered 2013-12-23 (×2): 8 [IU] via SUBCUTANEOUS

## 2013-12-23 MED ORDER — INSULIN GLARGINE 100 UNIT/ML ~~LOC~~ SOLN
10.0000 [IU] | Freq: Every day | SUBCUTANEOUS | Status: DC
  Administered 2013-12-23: 10 [IU] via SUBCUTANEOUS
  Filled 2013-12-23 (×2): qty 0.1

## 2013-12-23 MED ORDER — INSULIN ASPART 100 UNIT/ML ~~LOC~~ SOLN
4.0000 [IU] | Freq: Three times a day (TID) | SUBCUTANEOUS | Status: DC
  Administered 2013-12-23 (×3): 4 [IU] via SUBCUTANEOUS

## 2013-12-23 MED ORDER — INSULIN ASPART 100 UNIT/ML ~~LOC~~ SOLN
0.0000 [IU] | Freq: Three times a day (TID) | SUBCUTANEOUS | Status: DC
  Administered 2013-12-23: 8 [IU] via SUBCUTANEOUS
  Administered 2013-12-23: 3 [IU] via SUBCUTANEOUS
  Administered 2013-12-23: 5 [IU] via SUBCUTANEOUS

## 2013-12-23 MED ORDER — CHLORHEXIDINE GLUCONATE 0.12 % MT SOLN: 15.0000 mL | Freq: Two times a day (BID) | OROMUCOSAL | Status: DC

## 2013-12-23 MED ORDER — PANTOPRAZOLE SODIUM 40 MG IV SOLR: 40.0000 mg | Freq: Two times a day (BID) | INTRAVENOUS | Status: DC

## 2013-12-23 MED ORDER — PIPERACILLIN-TAZOBACTAM 3.375 G IVPB
3.3750 g | Freq: Three times a day (TID) | INTRAVENOUS | Status: DC
  Administered 2013-12-23 – 2013-12-25 (×6): 3.375 g via INTRAVENOUS
  Filled 2013-12-23 (×8): qty 50

## 2013-12-23 MED ORDER — INSULIN ASPART 100 UNIT/ML ~~LOC~~ SOLN
0.0000 [IU] | Freq: Every day | SUBCUTANEOUS | Status: DC
  Administered 2013-12-23: 2 [IU] via SUBCUTANEOUS

## 2013-12-23 NOTE — Progress Notes (Signed)
Name: Shane Pope MRN: [id number] DOB: 31-Oct-1931    ADMISSION DATE:  12/21/2013 CONSULTATION DATE:  2/16  REFERRING MD :  [facility]  PRIMARY SERVICE: PCCM   CHIEF COMPLAINT:  Acute renal failure, resp failure  BRIEF PATIENT DESCRIPTION:  80 year old male found on floor by family. Confused, unable to get up. In ER found to be hypotensive and in acute renal failure w/ profound metabolic derangements. PCCM asked to admit   SIGNIFICANT EVENTS / STUDIES:  Lumbar spine 2/16: no acute fx Thoracic spine 2/16: no acute fx, old L1 compression fx w/ vertebroplasty  CT head 2/16: negative  Pelvis 2/16: neg  Renal US 2/17: no hydro or obstruction 2/17 - high pressor needs, acidotic  LINES / TUBES: 2/16 rt ij>>> 2/16 ett>>> 2/17 rt rad>>>  CULTURES: BCX 2 2/16>>> UC 2/16>>> Resp Cx>>>mod gram + rods, few gram - rods, rare yeast  ANTIBIOTICS: 2/16 unasyn>>>2/18 2/18 zosyn>>> 2/18 vanc>>> 2/16 ceftriaxone>>>2/17  SUBJECTIVE:  Remains on super high pressors. A couple of toes looking purple.  VITAL SIGNS: Temp:  [97.7 F (36.5 C)-100.9 F (38.3 C)] 97.7 F (36.5 C) (02/18 0412) Pulse Rate:  [66-112] 72 (02/18 0600) Resp:  [0-36] 35 (02/18 0600) BP: (69-164)/(27-114) 142/57 mmHg (02/18 0600) SpO2:  [89 %-98 %] 97 % (02/18 0600) Arterial Line BP: (93-153)/(40-55) 131/49 mmHg (02/18 0600) FiO2 (%):  [50 %-80 %] 50 % (02/18 0308) Weight:  [82.2 kg (181 lb 3.5 oz)] 82.2 kg (181 lb 3.5 oz) (02/18 0500) HEMODYNAMICS: CVP:  [5 mmHg-18 mmHg] 12 mmHg VENTILATOR SETTINGS: Vent Mode:  [-] PRVC FiO2 (%):  [50 %-80 %] 50 % Set Rate:  [25 bmp-35 bmp] 35 bmp Vt Set:  [590 mL] 590 mL PEEP:  [5 cmH20-8 cmH20] 8 cmH20 Plateau Pressure:  [18 cmH20-29 cmH20] 22 cmH20 INTAKE / OUTPUT: Intake/Output     02/17 0701 - 02/18 0700   I.V. (mL/kg) 3733.5 (45.4)   NG/GT 904.2   IV Piggyback 1600   Total Intake(mL/kg) 6237.6 (75.9)   Urine (mL/kg/hr) 1435 (0.7)   Total Output 1435    Net +4802.6         PHYSICAL EXAMINATION: General:  Weak, frail elderly male Neuro:  Sedated, would not awaken to voice HEENT:  ETT in place Lungs:  CTA Abdomen:  Non-tender + bowel sounds Musculoskeletal: low muscle mass  Skin:  Bilateral toes purple, pulses present  LABS:  CBC  Recent Labs Lab 12/21/13 2138 12/21/13 2149 12/22/13 0200 12/23/13 0400  WBC 8.6  --  7.4  8.2 13.6*  HGB 16.4 16.3 13.7  14.6 14.4  HCT 47.6 48.0 41.2  43.9 44.8  PLT 216  --  196  229 292   Coag's No results found for this basename: APTT, INR,  in the last 168 hours BMET  Recent Labs Lab 12/22/13 1200 12/22/13 1700 12/23/13 0230  NA 162* 155* 149*  K 4.8 4.4 4.8  CL 122* 121* 118*  CO2 18* 15* 16*  BUN 163* 155* 141*  CREATININE 4.20* 3.84* 3.03*  GLUCOSE 113* 272* 265*   Electrolytes  Recent Labs Lab 12/22/13 0200 12/22/13 1200 12/22/13 1700 12/23/13 0230 12/23/13 0400  CALCIUM 7.6*  8.3* 7.7* 7.1* 7.1*  --   MG 3.0*  --   --   --  2.5  PHOS 8.4*  --   --   --  4.2   Sepsis Markers  Recent Labs Lab 12/21/13 2359 12/22/13 1200  LATICACIDVEN  --  1.8  PROCALCITON 0.20  --    ABG  Recent Labs Lab 12/22/13 1302 12/22/13 1540 12/23/13 0330  PHART 7.221* 7.244* 7.282*  PCO2ART 44.6 38.3 36.9  PO2ART 87.0 88.0 123.0*   Liver Enzymes  Recent Labs Lab 12/21/13 2138  AST 31  ALT 28  ALKPHOS 51  BILITOT 1.0  ALBUMIN 2.6*   Cardiac Enzymes  Recent Labs Lab 12/21/13 2138 12/22/13 0416 12/22/13 0425 12/22/13 1040  TROPONINI <0.30 <0.30  --  0.42*  PROBNP  --   --  418.4  --    Glucose  Recent Labs Lab 12/22/13 0746 12/22/13 1146 12/22/13 1528 12/22/13 1905 12/23/13 12/23/13 0402  GLUCAP 115* 108* 214* 239* 270* 272*    Imaging Dg Thoracic Spine 2 View  12/21/2013   CLINICAL DATA:  Fall, back pain.  EXAM: THORACIC SPINE - 2 VIEW  COMPARISON:  03/31/2013  FINDINGS: There is an old severe compression fracture noted at what appears to  be the L1 vertebral body with vertebroplasty changes. No acute fracture. Diffuse osteopenia. Normal alignment.  IMPRESSION: No acute fracture. Old L1 compression fracture with prior vertebroplasty.   Electronically Signed   By: Rolm Baptise M.D.   On: 12/21/2013 22:33   Dg Lumbar Spine 2-3 Views  12/21/2013   CLINICAL DATA:  Back pain.  Fall.  EXAM: LUMBAR SPINE - 2-3 VIEW  COMPARISON:  MR L SPINE W/O dated 04/18/2013; DG THORACOLUMBAR SPINE 2V dated 03/31/2013  FINDINGS: Old severe compression fracture at L1, status post vertebroplasty. Stable mild compression through the inferior endplate of 624THL. No acute fracture. Normal alignment. Degenerative facet disease in the lower lumbar spine.  Aortic and iliac calcifications without visible aneurysm.  IMPRESSION: No acute bony abnormality.   Electronically Signed   By: Rolm Baptise M.D.   On: 12/21/2013 22:35   Dg Pelvis 1-2 Views  12/21/2013   CLINICAL DATA:  Weakness, fall  EXAM: PELVIS - 1-2 VIEW  COMPARISON:  None.  FINDINGS: The exam is oblique due to patient motion. Hips are located. No evidence of pelvic fracture or sacral fracture. Marland Kitchen  IMPRESSION: No acute osseous abnormality.  Sacrum poorly evaluate   Electronically Signed   By: Suzy Bouchard M.D.   On: 12/21/2013 22:25   Ct Head Wo Contrast  12/21/2013   CLINICAL DATA:  Found down.  EXAM: CT HEAD WITHOUT CONTRAST  CT CERVICAL SPINE WITHOUT CONTRAST  TECHNIQUE: Multidetector CT imaging of the head and cervical spine was performed following the standard protocol without intravenous contrast. Multiplanar CT image reconstructions of the cervical spine were also generated.  COMPARISON:  None.  FINDINGS: CT HEAD FINDINGS  Diffuse cerebral atrophy. No acute intracranial abnormality. Specifically, no hemorrhage, hydrocephalus, mass lesion, acute infarction, or significant intracranial injury. No acute calvarial abnormality.  Mucosal thickening throughout the paranasal sinuses. Air-fluid levels noted in the  right maxillary sinus and sphenoid sinus. Mastoid air cells are clear.  CT CERVICAL SPINE FINDINGS  Image quality is degraded by motion. No evidence of malalignment. No definite fracture. Prevertebral soft tissues are normal. Layering frothy material noted within the oropharynx and nasopharynx posteriorly. No epidural or paraspinal hematoma.  Diffuse enlargement of the thyroid compatible with goiter.  IMPRESSION: No acute intracranial abnormality.  No acute bony abnormality in the cervical spine.  Frothy material layering within the airway.   Electronically Signed   By: Rolm Baptise M.D.   On: 12/21/2013 22:11   Ct Cervical Spine Wo Contrast  12/21/2013   CLINICAL DATA:  Found down.  EXAM: CT HEAD WITHOUT CONTRAST  CT CERVICAL SPINE WITHOUT CONTRAST  TECHNIQUE: Multidetector CT imaging of the head and cervical spine was performed following the standard protocol without intravenous contrast. Multiplanar CT image reconstructions of the cervical spine were also generated.  COMPARISON:  None.  FINDINGS: CT HEAD FINDINGS  Diffuse cerebral atrophy. No acute intracranial abnormality. Specifically, no hemorrhage, hydrocephalus, mass lesion, acute infarction, or significant intracranial injury. No acute calvarial abnormality.  Mucosal thickening throughout the paranasal sinuses. Air-fluid levels noted in the right maxillary sinus and sphenoid sinus. Mastoid air cells are clear.  CT CERVICAL SPINE FINDINGS  Image quality is degraded by motion. No evidence of malalignment. No definite fracture. Prevertebral soft tissues are normal. Layering frothy material noted within the oropharynx and nasopharynx posteriorly. No epidural or paraspinal hematoma.  Diffuse enlargement of the thyroid compatible with goiter.  IMPRESSION: No acute intracranial abnormality.  No acute bony abnormality in the cervical spine.  Frothy material layering within the airway.   Electronically Signed   By: Rolm Baptise M.D.   On: 12/21/2013 22:11   US  Renal  12/22/2013   CLINICAL DATA:  Rule out obstruction  EXAM: RENAL/URINARY TRACT ULTRASOUND COMPLETE  COMPARISON:  None.  FINDINGS: Right Kidney:  Length: 11.8 cm. Echogenicity within normal limits. No mass or hydronephrosis visualized.  Left Kidney:  Length: 11.3 cm. Echogenicity within normal limits. No mass or hydronephrosis visualized.  Bladder:  Bladder collapsed frontal Foley catheter.  IMPRESSION: No hydronephrosis.  No evidence of renal obstruction.   Electronically Signed   By: Suzy Bouchard M.D.   On: 12/22/2013 19:02   Dg Chest Port 1 View  12/23/2013   CLINICAL DATA:  Ventilator.  EXAM: PORTABLE CHEST - 1 VIEW  COMPARISON:  12/22/2013  FINDINGS: Support devices remain in stable position. Heart is mildly enlarged. Diffuse interstitial prominence throughout the lungs, likely interstitial edema. Bibasilar atelectasis. No effusions. No acute bony abnormality.  IMPRESSION: Mild interstitial prominence, increasing since prior study, likely mild edema. Bibasilar atelectasis.   Electronically Signed   By: Rolm Baptise M.D.   On: 12/23/2013 05:43   Dg Chest Port 1 View  12/22/2013   CLINICAL DATA:  Central line and orogastric tube placement  EXAM: PORTABLE CHEST - 1 VIEW  COMPARISON:  Chest radiograph December 21, 2013  FINDINGS: Interval intubation, distal tip projects approximately 6.8 cm above the carina. New right internal jugular central venous catheter with distal tip projecting in mid superior vena cava. Nasogastric tube projects in proximal stomach with side port immediately distal to the gastroesophageal junction region. Multiple EKG lines overlie the patient and may obscure subtle underlying pathology. Multiple pacer pads overlie the patient.  The cardiac silhouette is upper limits of normal in size. Similar mild interstitial changes without pleural effusions or focal consolidations. No pneumothorax. Soft tissue planes and included osseous structures are unchanged.  IMPRESSION: Endotracheal  tube tip projects 6.8 cm above the carina, consider 1-2 cm of advancement. New right IJ line with distal tip in mid superior vena cava, no pneumothorax. Orogastric tube past the gastroesophageal junction, distal tip projecting in proximal stomach.  Borderline cardiomegaly and similar mild interstitial prominence.   Electronically Signed   By: Elon Alas   On: 12/22/2013 02:08   Dg Chest Port 1 View  12/21/2013   CLINICAL DATA:  Weakness  EXAM: PORTABLE CHEST - 1 VIEW  COMPARISON:  03/27/2012  FINDINGS: Cardiac shadow is stable and mildly enlarged. Diffuse interstitial changes are again  identified and stable. No focal infiltrate or sizable effusion is seen. No acute bony abnormality is noted.  IMPRESSION: Chronic changes without acute abnormality.   Electronically Signed   By: Inez Catalina M.D.   On: 12/21/2013 21:31     CXR: ett 5 cm, mild increase in interstitial prominence  ASSESSMENT / PLAN:  PULMONARY A: Acute resp failure aspiration PNA ARDS P:   Vent FiO2 50% PEEP 8 RR 35 Tv 590 abg improved, reduce fio2, peep, maintain high MV pcxr in am  No weaning with high MV and shock state Ensure plat less 30 ensure on ards protocol, to goal 6-7 cc/kg  CARDIOVASCULAR A: Hypovolemia, shock, r/o septic Lactic acid normal No REL AI Mild at best stress ischemia P:  cvp 12, continue to monitor Continue to allow pos balance Wean pressors as able - levophed and vasopressin Requires echo assessment with such high pressor needs Goal MAP 60 Maintain vasopressin If echo with cardiomyopathy, would consider dobutamine, assess svo2 x 1 now  RENAL A:   Acute renal failure: in setting of dehydration. He was on ace-I and NSAID, ATN likely  Metabolic acidosis + anion gap mild, a larger NON AG Hypernatremia improved Hyperchloremia Dehydration   P:   D5W add 2 amps bicarb Hold antihypertensives  Repeat total CKs down trending  f/u urine myoglobin  strick I&O  +4.8 L yesterday BMET q8  hr  GASTROINTESTINAL A:  NPO, no GI bleed noted P:    Start TF, tolerated thus far Protonix   HEMATOLOGIC A: R/O UGIB: has black tarry stools, HCt stable P:  Trend CBC in am  heparin [place] for DVT prophylaxis PPI to bid  INFECTIOUS A:  R/o aspiration, decub / arm wound Procalcitonin 0.20 P:   broaden to zosyn, vanc Wound care ensure good pulses to arm  ENDOCRINE A:  H/O DM  TSH 0.816 - WNL P:   SSI Consider addition of lantus Check cortisol- no evidence rel AI Dc stress dose steroids  NEUROLOGIC A:  Mild acute encephalopathy.  P:   fent Versed prn rass to goal -1 WUA  DERM A: sacral pressure ulcer: unstageable      Pressure ulcer right arm: unstageable  P: WOC consult appreciated  Tommi Rumps, MD  [facility] PGY-2  TODAY'S SUMMARY: fent, versed prn, continue pos balance, add bicarb, assess echo, svo2  I have personally obtained a history, examined the patient, evaluated laboratory and imaging results, formulated the assessment and plan and placed orders.  CRITICAL CARE: The patient is critically ill with multiple organ systems failure and requires high complexity decision making for assessment and support, frequent evaluation and titration of therapies, application of advanced monitoring technologies and extensive interpretation of multiple databases. Critical Care Time devoted to patient care services described in this note is 30 minutes.   Lavon Paganini. Titus Mould, MD, [place] Pgr: [facility]

## 2013-12-23 NOTE — Progress Notes (Signed)
ANTIBIOTIC CONSULT NOTE - INITIAL  Pharmacy Consult:  Zosyn Indication:  Possible aspiration PNA  No Known Allergies  Patient Measurements: Height: 6' 0.83" (185 cm) Weight: 181 lb 3.5 oz (82.2 kg) IBW/kg (Calculated) : 79.52  Vital Signs: Temp: 98.7 F (37.1 C) (02/18 0807) Temp src: Oral (02/18 0807) BP: 159/64 mmHg (02/18 1000) Pulse Rate: 69 (02/18 1000) Intake/Output from previous day: 02/17 0701 - 02/18 0700 In: 6490.4 [I.V.:3916.3; NG/GT:974.2; IV Piggyback:1600] Out: 1435 [Urine:1435] Intake/Output from this shift: Total I/O In: 936 [I.V.:496; NG/GT:390; IV Piggyback:50] Out: 250 [Urine:250]  Labs:  Recent Labs  12/21/13 2138 12/21/13 2149 12/22/13 0200 12/22/13 1200 12/22/13 1700 12/23/13 0230 12/23/13 0400  WBC 8.6  --  7.4  8.2  --   --   --  13.6*  HGB 16.4 16.3 13.7  14.6  --   --   --  14.4  PLT 216  --  196  229  --   --   --  292  CREATININE 3.99* 4.20* 4.06*  4.01* 4.20* 3.84* 3.03*  --    Estimated Creatinine Clearance: 21.9 ml/min (by C-G formula based on Cr of 3.03). No results found for this basename: VANCOTROUGH, Corlis Leak, VANCORANDOM, GENTTROUGH, GENTPEAK, GENTRANDOM, TOBRATROUGH, TOBRAPEAK, TOBRARND, AMIKACINPEAK, AMIKACINTROU, AMIKACIN,  in the last 72 hours   Microbiology: Recent Results (from the past 720 hour(s))  CULTURE, BLOOD (ROUTINE X 2)     Status: None   Collection Time    12/21/13 11:52 PM      Result Value Ref Range Status   Specimen Description BLOOD RIGHT HAND   Final   Special Requests     Final   Value: BOTTLES DRAWN AEROBIC AND ANAEROBIC 10CC BLUE 5CC RED   Culture  Setup Time     Final   Value: 12/22/2013 07:56     Performed at [facility]   Culture     Final   Value:        BLOOD CULTURE RECEIVED NO GROWTH TO DATE CULTURE WILL BE HELD FOR 5 DAYS BEFORE ISSUING A FINAL NEGATIVE REPORT     Performed at [facility]   Report Status PENDING   Incomplete  CULTURE, RESPIRATORY (NON-EXPECTORATED)      Status: None   Collection Time    12/22/13  2:20 AM      Result Value Ref Range Status   Specimen Description TRACHEAL ASPIRATE   Final   Special Requests NONE   Final   Gram Stain     Final   Value: MODERATE WBC PRESENT, PREDOMINANTLY PMN     NO SQUAMOUS EPITHELIAL CELLS SEEN     MODERATE GRAM POSITIVE RODS     FEW GRAM NEGATIVE RODS     RARE YEAST   Culture PENDING   Incomplete   Report Status PENDING   Incomplete  URINE CULTURE     Status: None   Collection Time    12/22/13  2:56 AM      Result Value Ref Range Status   Specimen Description URINE, RANDOM   Final   Special Requests NONE   Final   Culture  Setup Time     Final   Value: 12/22/2013 08:04     Performed at [place]     Final   Value: NO GROWTH     Performed at [facility]   Culture     Final   Value: NO GROWTH  Performed at [facility]   Report Status 12/22/2013 FINAL   Final  MRSA PCR SCREENING     Status: None   Collection Time    12/22/13  2:56 AM      Result Value Ref Range Status   MRSA by PCR NEGATIVE  NEGATIVE Final   Comment:            The GeneXpert MRSA Assay (FDA     approved for NASAL specimens     only), is one component of a     comprehensive MRSA colonization     surveillance program. It is not     intended to diagnose MRSA     infection nor to guide or     monitor treatment for     MRSA infections.    Medical History: Past Medical History  Diagnosis Date  . Hypertension   . Diabetes mellitus   . Anemia   . Blood transfusion     hx of   . GERD (gastroesophageal reflux disease)   . Arthritis   . 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 Fracture     hx of right wrist fracture   . Fracture, ribs     hx of on right side   . Pneumothorax on right   . PVD (peripheral vascular disease)   . Hyperlipidemia       Assessment: 21 YOM who lives alone and previously independent, found on the floor by his family and admitted to the  ICU.  Patient was started on Unasyn for possible aspiration PNA.  Antibiotic to change to Zosyn for unimproved clinical status.  Patient's renal function is improving.  Zosyn 2/17 x1, 2/18 >> Unasyn 2/17 >> 2/18  2/16 urine - negative 2/16 BCx - collected 2/16 TA: mod GPR, rare GNR, rare yeast on Gram stain   Goal of Therapy:  Clearance of infection / Infection prevention   Plan:  - Zosyn 3.375gm IV Q8H, 4 hr infusion - Monitor renal fxn and adjust dosage as indicated - F/U micro data, clinical course, home meds when able    Aviyah Swetz D. Mina Marble, PharmD, BCPS Pager:  [phone] 12/23/2013, 10:56 AM

## 2013-12-23 NOTE — Progress Notes (Signed)
  Echocardiogram 2D Echocardiogram has been performed.  Shane Pope 12/23/2013, 2:27 PM

## 2013-12-23 NOTE — Progress Notes (Signed)
Inpatient Diabetes Program Recommendations  AACE/ADA: New Consensus Statement on Inpatient Glycemic Control (2013)  Target Ranges:  Prepandial:   less than 140 mg/dL      Peak postprandial:   less than 180 mg/dL (1-2 hours)      Critically ill patients:  140 - 180 mg/dL   Reason for Visit: Results for DAKIN, MADANI (MRN [id number]) as of 12/23/2013 13:47  Ref. Range 12/22/2013 19:05 12/23/2013 00:00 12/23/2013 04:02 12/23/2013 08:00 12/23/2013 12:30  Glucose-Capillary Latest Range: 70-99 mg/dL 239 (H) 270 (H) 272 (H) 218 (H) 200 (H)   Diabetes history: Type 2 Diabetes Outpatient Diabetes medications: None listed Current orders for Inpatient glycemic control: Note patient on Moderate tid with meals and Novolog 4 units tid with meals (meal coverage).  Called and discussed with RN.  Patient is on continuous tube feeds and therefore coverage and tube feed coverage should be ordered q 4 hours.   Will follow. Adah Perl, RN, BC-ADM Inpatient Diabetes Coordinator Pager [phone]

## 2013-12-24 ENCOUNTER — Inpatient Hospital Stay (HOSPITAL_COMMUNITY): Payer: Medicare Other

## 2013-12-24 LAB — GLUCOSE, CAPILLARY
Glucose-Capillary: 202 mg/dL — ABNORMAL HIGH (ref 70–99)
Glucose-Capillary: 217 mg/dL — ABNORMAL HIGH (ref 70–99)
Glucose-Capillary: 247 mg/dL — ABNORMAL HIGH (ref 70–99)
Glucose-Capillary: 252 mg/dL — ABNORMAL HIGH (ref 70–99)
Glucose-Capillary: 261 mg/dL — ABNORMAL HIGH (ref 70–99)
Glucose-Capillary: 279 mg/dL — ABNORMAL HIGH (ref 70–99)

## 2013-12-24 LAB — BASIC METABOLIC PANEL
BUN: 103 mg/dL — AB (ref 6–23)
BUN: 93 mg/dL — AB (ref 6–23)
CALCIUM: 6.9 mg/dL — AB (ref 8.4–10.5)
CHLORIDE: 110 meq/L (ref 96–112)
CO2: 23 meq/L (ref 19–32)
CO2: 24 mEq/L (ref 19–32)
Calcium: 6.7 mg/dL — ABNORMAL LOW (ref 8.4–10.5)
Chloride: 107 mEq/L (ref 96–112)
Creatinine, Ser: 1.99 mg/dL — ABNORMAL HIGH (ref 0.50–1.35)
Creatinine, Ser: 1.85 mg/dL — ABNORMAL HIGH (ref 0.50–1.35)
GFR calc Af Amer: 38 mL/min — ABNORMAL LOW (ref >90)
GFR calc non Af Amer: 30 mL/min — ABNORMAL LOW (ref >90)
GFR, EST AFRICAN AMERICAN: 35 mL/min — AB (ref >90)
GFR, EST NON AFRICAN AMERICAN: 33 mL/min — AB (ref >90)
GLUCOSE: 261 mg/dL — AB (ref 70–99)
Glucose, Bld: 290 mg/dL — ABNORMAL HIGH (ref 70–99)
POTASSIUM: 3.6 meq/L — AB (ref 3.7–5.3)
POTASSIUM: 4.2 meq/L (ref 3.7–5.3)
SODIUM: 145 meq/L (ref 137–147)
Sodium: 146 mEq/L (ref 137–147)

## 2013-12-24 LAB — BLOOD GAS, ARTERIAL
Acid-base deficit: 1.5 mmol/L (ref 0.0–2.0)
Bicarbonate: 22.3 mEq/L (ref 20.0–24.0)
Drawn by: 39899
FIO2: 0.4 %
LHR: 35 {breaths}/min
O2 Saturation: 94.1 %
PATIENT TEMPERATURE: 99.5
PEEP/CPAP: 5 cmH2O
TCO2: 23.3 mmol/L (ref 0–100)
VT: 590 mL
pCO2 arterial: 35.5 mmHg (ref 35.0–45.0)
pH, Arterial: 7.417 (ref 7.350–7.450)
pO2, Arterial: 68.2 mmHg — ABNORMAL LOW (ref 80.0–100.0)

## 2013-12-24 LAB — CBC
HEMATOCRIT: 37.4 % — AB (ref 39.0–52.0)
Hemoglobin: 12.4 g/dL — ABNORMAL LOW (ref 13.0–17.0)
MCH: 29.1 pg (ref 26.0–34.0)
MCHC: 33.2 g/dL (ref 30.0–36.0)
MCV: 87.8 fL (ref 78.0–100.0)
PLATELETS: 161 10*3/uL (ref 150–400)
RBC: 4.26 MIL/uL (ref 4.22–5.81)
RDW: 16.6 % — ABNORMAL HIGH (ref 11.5–15.5)
WBC: 11.7 10*3/uL — ABNORMAL HIGH (ref 4.0–10.5)

## 2013-12-24 LAB — CULTURE, RESPIRATORY

## 2013-12-24 LAB — PHOSPHORUS: PHOSPHORUS: 1.7 mg/dL — AB (ref 2.3–4.6)

## 2013-12-24 LAB — POCT I-STAT 3, ART BLOOD GAS (G3+)
Acid-base deficit: 10 mmol/L — ABNORMAL HIGH (ref 0.0–2.0)
Bicarbonate: 14 mEq/L — ABNORMAL LOW (ref 20.0–24.0)
O2 SAT: 90 %
PCO2 ART: 25.2 mmHg — AB (ref 35.0–45.0)
PH ART: 7.356 (ref 7.350–7.450)
Patient temperature: 99.9
TCO2: 15 mmol/L (ref 0–100)
pO2, Arterial: 61 mmHg — ABNORMAL LOW (ref 80.0–100.0)

## 2013-12-24 LAB — OCCULT BLOOD X 1 CARD TO LAB, STOOL: Fecal Occult Bld: POSITIVE — AB

## 2013-12-24 LAB — CULTURE, RESPIRATORY W GRAM STAIN

## 2013-12-24 LAB — MAGNESIUM: Magnesium: 2.3 mg/dL (ref 1.5–2.5)

## 2013-12-24 MED ORDER — SODIUM CHLORIDE 0.45 % IV SOLN
INTRAVENOUS | Status: DC
  Administered 2013-12-24: 50 mL/h via INTRAVENOUS
  Administered 2013-12-25: 06:00:00 via INTRAVENOUS
  Administered 2013-12-26: 20 mL/h via INTRAVENOUS

## 2013-12-24 MED ORDER — SODIUM CHLORIDE 0.9 % IV SOLN
1.0000 g | Freq: Once | INTRAVENOUS | Status: AC
  Administered 2013-12-24: 1 g via INTRAVENOUS
  Filled 2013-12-24: qty 10

## 2013-12-24 MED ORDER — POTASSIUM CHLORIDE 20 MEQ/15ML (10%) PO LIQD
30.0000 meq | Freq: Once | ORAL | Status: DC
Start: 2013-12-24 — End: 2013-12-24
  Filled 2013-12-24: qty 22.5

## 2013-12-24 MED ORDER — POTASSIUM PHOSPHATE DIBASIC 3 MMOLE/ML IV SOLN
20.0000 mmol | Freq: Once | INTRAVENOUS | Status: AC
  Administered 2013-12-24: 20 mmol via INTRAVENOUS
  Filled 2013-12-24: qty 6.67

## 2013-12-24 MED ORDER — POTASSIUM CHLORIDE 20 MEQ/15ML (10%) PO LIQD
30.0000 meq | Freq: Once | ORAL | Status: AC
  Administered 2013-12-24: 30 meq
  Filled 2013-12-24: qty 22.5

## 2013-12-24 MED ORDER — INSULIN GLARGINE 100 UNIT/ML ~~LOC~~ SOLN
20.0000 [IU] | Freq: Every day | SUBCUTANEOUS | Status: DC
  Administered 2013-12-24 – 2013-12-25 (×2): 20 [IU] via SUBCUTANEOUS
  Filled 2013-12-24 (×2): qty 0.2

## 2013-12-24 MED ORDER — POTASSIUM CHLORIDE 20 MEQ/15ML (10%) PO LIQD: 40.0000 meq | Freq: Once | ORAL | Status: DC

## 2013-12-24 MED ORDER — INSULIN ASPART 100 UNIT/ML ~~LOC~~ SOLN
0.0000 [IU] | SUBCUTANEOUS | Status: DC
  Administered 2013-12-24 (×2): 5 [IU] via SUBCUTANEOUS
  Administered 2013-12-24: 8 [IU] via SUBCUTANEOUS
  Administered 2013-12-24: 5 [IU] via SUBCUTANEOUS
  Administered 2013-12-24 (×2): 8 [IU] via SUBCUTANEOUS
  Administered 2013-12-25: 3 [IU] via SUBCUTANEOUS
  Administered 2013-12-25: 2 [IU] via SUBCUTANEOUS
  Administered 2013-12-25 (×2): 3 [IU] via SUBCUTANEOUS
  Administered 2013-12-25: 5 [IU] via SUBCUTANEOUS
  Administered 2013-12-25: 3 [IU] via SUBCUTANEOUS
  Administered 2013-12-26 – 2013-12-28 (×11): 2 [IU] via SUBCUTANEOUS
  Administered 2013-12-28: 3 [IU] via SUBCUTANEOUS
  Administered 2013-12-29 – 2014-01-01 (×9): 2 [IU] via SUBCUTANEOUS
  Administered 2014-01-01: 1 [IU] via SUBCUTANEOUS
  Administered 2014-01-01 – 2014-01-03 (×3): 2 [IU] via SUBCUTANEOUS

## 2013-12-24 NOTE — Progress Notes (Signed)
[facility] ICU Electrolyte Replacement Protocol  Patient Name: Shane Pope DOB: Dec 24, 1930 MRN: [id number]  Date of Service  12/24/2013   HPI/Events of Note    Recent Labs Lab 12/21/13 2149 12/22/13 0200  12/22/13 1700 12/23/13 0230 12/23/13 0400 12/23/13 1000 12/23/13 1830 12/24/13 0230  NA 158* 159*  158*  < > 155* 149*  --  148* 145 146  K 4.5 5.1  5.0  < > 4.4 4.8  --  4.1 3.7 3.6*  CL 122* 123*  121*  < > 121* 118*  --  115* 112 110  CO2  --  16*  17*  < > 15* 16*  --  _0 GLUCOSE 119* 134*  151*  < > 272* 265*  --  216* 300* 290*  BUN >140* 163*  165*  < > 155* 141*  --  125* 106* 103*  CREATININE 4.20* 4.06*  4.01*  < > 3.84* 3.03*  --  2.57* 2.03* 1.99*  CALCIUM  --  7.6*  8.3*  < > 7.1* 7.1*  --  6.9* 6.5* 6.7*  MG  --  3.0*  --   --   --  2.5  --   --  2.3  PHOS  --  8.4*  --   --   --  4.2  --   --  1.7*  < > = values in this interval not displayed.  Estimated Creatinine Clearance: 33.3 ml/min (by C-G formula based on Cr of 1.99).  Intake/Output     02/18 0701 - 02/19 0700   I.V. (mL/kg) 3433.2 (40.2)   NG/GT 2515   IV Piggyback 200   Total Intake(mL/kg) 6148.2 (71.9)   Urine (mL/kg/hr) 1840 (0.9)   Total Output 1840   Net +4308.2        - I/O DETAILED x24h    Total I/O In: 2841.8 [I.V.:1589.3; NG/GT:1165; IV Piggyback:87.5] Out: 640 [Urine:640] - I/O THIS SHIFT    ASSESSMENT   eICURN Interventions   Electrolyte protocol criteria not met. Value not replaced per protocol dosing per MD request.    ASSESSMENT: MAJOR ELECTROLYTE    Lorene Dy 12/24/2013, 6:32 AM

## 2013-12-24 NOTE — Progress Notes (Signed)
Inpatient Diabetes Program Recommendations  AACE/ADA: New Consensus Statement on Inpatient Glycemic Control (2013)  Target Ranges:  Prepandial:   less than 140 mg/dL      Peak postprandial:   less than 180 mg/dL (1-2 hours)      Critically ill patients:  140 - 180 mg/dL   Reason for Visit: Results for Shane Pope, Shane Pope (MRN [id number]) as of 12/24/2013 10:44  Ref. Range 12/23/2013 18:33 12/23/2013 22:05 12/24/2013 00:22 12/24/2013 03:32 12/24/2013 08:33  Glucose-Capillary Latest Range: 70-99 mg/dL 275 (H) 212 (H) 279 (H) 252 (H) 261 (H)   Note that Lantus insulin increased this morning to 20 units daily.  If CBG's remain elevated, consider adding Novolog tube feed coverage 3 units q 4 hours.       Adah Perl, RN, BC-ADM Inpatient Diabetes Coordinator Pager [phone]

## 2013-12-24 NOTE — Progress Notes (Signed)
[facility] Progress Note Patient Name: Shane Pope DOB: May 13, 1931 MRN: [id number]  Date of Service  12/24/2013   HPI/Events of Note   Stool noted to be guiac positive; no bloody GI secretions  eICU Interventions  Send H/H   Intervention Category Major Interventions: Hemorrhage - evaluation and management  Dailan Pfalzgraf 12/24/2013, 10:01 PM

## 2013-12-24 NOTE — Progress Notes (Signed)
Name: Shane Pope MRN: [id number] DOB: Jun 12, 1931    ADMISSION DATE:  12/21/2013 CONSULTATION DATE:  2/16  REFERRING MD :  [facility]  PRIMARY SERVICE: PCCM   CHIEF COMPLAINT:  Acute renal failure, resp failure  BRIEF PATIENT DESCRIPTION:  80 year old male found on floor by family. Confused, unable to get up. In ER found to be hypotensive and in acute renal failure w/ profound metabolic derangements. PCCM asked to admit   SIGNIFICANT EVENTS / STUDIES:  Lumbar spine 2/16: no acute fx Thoracic spine 2/16: no acute fx, old L1 compression fx w/ vertebroplasty  CT head 2/16: negative  Pelvis 2/16: neg  Renal US 2/17: no hydro or obstruction 2/17 - high pressor needs, acidotic 2/18 - Echo 30-35%, akinesis of distal anteroseptal myocardium, grade 1 diastolic dysfunction, PA pressure mildly-moderately increased 2/19- pressors improved  LINES / TUBES: 2/16 rt ij>>> 2/16 ett>>> 2/17 rt rad>>>  CULTURES: BCX 2 2/16>>> UC 2/16>>> Resp Cx>>>abundant pseudomonas  ANTIBIOTICS: 2/16 unasyn>>>2/18 2/18 zosyn>>> 2/18 vanc>>>2/19 2/16 ceftriaxone>>>2/17  SUBJECTIVE:  Levophed down to 15 this morning. Toes less purple this morning demarcating  VITAL SIGNS: Temp:  [97.6 F (36.4 C)-99.9 F (37.7 C)] 98.9 F (37.2 C) (02/19 0600) Pulse Rate:  [61-123] 99 (02/19 0630) Resp:  [18-36] 27 (02/19 0630) BP: (91-159)/(39-85) 97/50 mmHg (02/19 0600) SpO2:  [91 %-100 %] 92 % (02/19 0630) Arterial Line BP: (75-185)/(31-59) 104/38 mmHg (02/19 0630) FiO2 (%):  [40 %-50 %] 40 % (02/19 0600) Weight:  [188 lb 7.9 oz (85.5 kg)] 188 lb 7.9 oz (85.5 kg) (02/19 0400) HEMODYNAMICS: CVP:  [8 mmHg-10 mmHg] 10 mmHg VENTILATOR SETTINGS: Vent Mode:  [-] PRVC FiO2 (%):  [40 %-50 %] 40 % Set Rate:  [35 bmp] 35 bmp Vt Set:  [590 mL] 590 mL PEEP:  [5 cmH20-8 cmH20] 5 cmH20 Plateau Pressure:  [20 cmH20-25 cmH20] 20 cmH20 INTAKE / OUTPUT: Intake/Output     02/18 0701 - 02/19 0700   I.V.  (mL/kg) 3433.2 (40.2)   NG/GT 2515   IV Piggyback 200   Total Intake(mL/kg) 6148.2 (71.9)   Urine (mL/kg/hr) 1840 (0.9)   Total Output 1840   Net +4308.2         PHYSICAL EXAMINATION: General:  Weak, frail elderly male Neuro:  Sedated, would not awaken to voice HEENT:  ETT in place CV: rrr, no murmur appreciated Lungs:  CTA Abdomen:  Non-tender + bowel sounds Musculoskeletal: low muscle mass  Skin:  Bilateral toes less purple, extremities warm, LE pulses on doppler yesterday, left radial pulse 2+  LABS:  CBC  Recent Labs Lab 12/22/13 0200 12/23/13 0400 12/24/13 0230  WBC 7.4  8.2 13.6* 11.7*  HGB 13.7  14.6 14.4 12.4*  HCT 41.2  43.9 44.8 37.4*  PLT 196  229 292 161   Coag's No results found for this basename: APTT, INR,  in the last 168 hours BMET  Recent Labs Lab 12/23/13 1000 12/23/13 1830 12/24/13 0230  NA 148* 145 146  K 4.1 3.7 3.6*  CL 115* 112 110  CO2 19 19 23   BUN 125* 106* 103*  CREATININE 2.57* 2.03* 1.99*  GLUCOSE 216* 300* 290*   Electrolytes  Recent Labs Lab 12/22/13 0200  12/23/13 0400 12/23/13 1000 12/23/13 1830 12/24/13 0230  CALCIUM 7.6*  8.3*  < >  --  6.9* 6.5* 6.7*  MG 3.0*  --  2.5  --   --  2.3  PHOS 8.4*  --  4.2  --   --  1.7*  < > = values in this interval not displayed. Sepsis Markers  Recent Labs Lab 12/21/13 2359 12/22/13 1200  LATICACIDVEN  --  1.8  PROCALCITON 0.20  --    ABG  Recent Labs Lab 12/22/13 1540 12/23/13 0330 12/24/13 0345  PHART 7.244* 7.282* 7.417  PCO2ART 38.3 36.9 35.5  PO2ART 88.0 123.0* 68.2*   Liver Enzymes  Recent Labs Lab 12/21/13 2138  AST 31  ALT 28  ALKPHOS 51  BILITOT 1.0  ALBUMIN 2.6*   Cardiac Enzymes  Recent Labs Lab 12/21/13 2138 12/22/13 0416 12/22/13 0425 12/22/13 1040  TROPONINI <0.30 <0.30  --  0.42*  PROBNP  --   --  418.4  --    Glucose  Recent Labs Lab 12/23/13 1230 12/23/13 1618 12/23/13 1833 12/23/13 2205 12/24/13 0022  12/24/13 0332  GLUCAP 200* 257* 275* 212* 279* 252*    Imaging US Renal  12/22/2013   CLINICAL DATA:  Rule out obstruction  EXAM: RENAL/URINARY TRACT ULTRASOUND COMPLETE  COMPARISON:  None.  FINDINGS: Right Kidney:  Length: 11.8 cm. Echogenicity within normal limits. No mass or hydronephrosis visualized.  Left Kidney:  Length: 11.3 cm. Echogenicity within normal limits. No mass or hydronephrosis visualized.  Bladder:  Bladder collapsed frontal Foley catheter.  IMPRESSION: No hydronephrosis.  No evidence of renal obstruction.   Electronically Signed   By: Suzy Bouchard M.D.   On: 12/22/2013 19:02   Dg Chest Port 1 View  12/23/2013   CLINICAL DATA:  Ventilator.  EXAM: PORTABLE CHEST - 1 VIEW  COMPARISON:  12/22/2013  FINDINGS: Support devices remain in stable position. Heart is mildly enlarged. Diffuse interstitial prominence throughout the lungs, likely interstitial edema. Bibasilar atelectasis. No effusions. No acute bony abnormality.  IMPRESSION: Mild interstitial prominence, increasing since prior study, likely mild edema. Bibasilar atelectasis.   Electronically Signed   By: Rolm Baptise M.D.   On: 12/23/2013 05:43     CXR: ett wnl, slight improved int infiltrates rt  ASSESSMENT / PLAN:  PULMONARY A: Acute resp failure Pseudomonas PNA ARDS P:   Vent FiO2 40% PEEP 5 RR 35 Tv 590 abg improved, consider SBT ( as on ARDS can go straight to weaning (nejm 2000)) cpap 5 ps 5 goal 2 hrs pcxr in am  Await improved neuro to extubate if weaning successful  CARDIOVASCULAR A: Hypovolemia, shock, r/o septic compoenent, cardiac (svo2 adaquate) No REL AI Mild at best stress ischemia Echo 30-35%, akinesis of distal anteroseptal myocardium, grade 1 diastolic dysfunction, PA pressure mildly-moderately increased P:  cvp 10, continue to monitor Continue to allow pos balance +4.3 L yesterday, less pos howevere with known EF noted Wean pressors as able - levophed and vasopressin Goal MAP  60 Given svo2 , no role dobutamine  RENAL A:   Acute renal failure - stable Metabolic acidosis + anion gap mild, a larger NON AG - resolved Hypokalemia Hypophosphatemia Hypocalcemia Dehydration   P:   D5W add 2 amps bicarb - consider change to 1/2 NS with resolution of acidosis and reduce rate  Hold antihypertensives  Replete potassium, phosphate, calcium   strick I&O  +4.3 L yesterday, limit this today BMET to q12h  GASTROINTESTINAL A:  NPO, no GI bleed noted P:    Continue TF Protonix   HEMATOLOGIC A: Anemia - likely dilutional P:  Trend CBC in am  heparin Victor for DVT prophylaxis PPI BID  INFECTIOUS A:  R/o aspiration, decub / arm wound Procalcitonin 0.20 Pseudomonal PNA P:   maintain  zosyn given Pseudomonas in resp culture and d/c vanc given no GPC found Wound care ensure good pulses to arm  ENDOCRINE A:  H/O DM  TSH 0.816 - WNL No relative AI P:   SSI Increase lantus to 20  NEUROLOGIC A:  Mild acute encephalopathy.  P:   fent to dc to int if able Versed prn rass to goal -1 WUA upright  DERM A: sacral pressure ulcer: unstageable      Pressure ulcer right arm: unstageable  P: WOC consult appreciated  Tommi Rumps, MD  [place] PGY-2  TODAY'S SUMMARY: fent, versed prn, narrow to zosyn, continue positive balance, wean pressors, SBT this morning  I have personally obtained a history, examined the patient, evaluated laboratory and imaging results, formulated the assessment and plan and placed orders.  CRITICAL CARE: The patient is critically ill with multiple organ systems failure and requires high complexity decision making for assessment and support, frequent evaluation and titration of therapies, application of advanced monitoring technologies and extensive interpretation of multiple databases. Critical Care Time devoted to patient care services described in this note is 30 minutes.   Lavon Paganini. Titus Mould, MD, [place] Pgr:  [facility]

## 2013-12-25 ENCOUNTER — Inpatient Hospital Stay (HOSPITAL_COMMUNITY): Payer: Medicare Other

## 2013-12-25 LAB — BASIC METABOLIC PANEL
BUN: 87 mg/dL — AB (ref 6–23)
BUN: 69 mg/dL — ABNORMAL HIGH (ref 6–23)
CALCIUM: 7 mg/dL — AB (ref 8.4–10.5)
CHLORIDE: 107 meq/L (ref 96–112)
CO2: 27 mEq/L (ref 19–32)
CO2: 27 meq/L (ref 19–32)
CREATININE: 1.47 mg/dL — AB (ref 0.50–1.35)
Calcium: 7.2 mg/dL — ABNORMAL LOW (ref 8.4–10.5)
Chloride: 109 mEq/L (ref 96–112)
Creatinine, Ser: 1.72 mg/dL — ABNORMAL HIGH (ref 0.50–1.35)
GFR calc Af Amer: 41 mL/min — ABNORMAL LOW (ref >90)
GFR calc non Af Amer: 43 mL/min — ABNORMAL LOW (ref >90)
GFR, EST AFRICAN AMERICAN: 50 mL/min — AB (ref >90)
GFR, EST NON AFRICAN AMERICAN: 36 mL/min — AB (ref >90)
Glucose, Bld: 215 mg/dL — ABNORMAL HIGH (ref 70–99)
Glucose, Bld: 120 mg/dL — ABNORMAL HIGH (ref 70–99)
Potassium: 3.6 mEq/L — ABNORMAL LOW (ref 3.7–5.3)
Potassium: 3.9 mEq/L (ref 3.7–5.3)
Sodium: 148 mEq/L — ABNORMAL HIGH (ref 137–147)
Sodium: 146 mEq/L (ref 137–147)

## 2013-12-25 LAB — BLOOD GAS, ARTERIAL
Acid-Base Excess: 1.7 mmol/L (ref 0.0–2.0)
BICARBONATE: 24.9 meq/L — AB (ref 20.0–24.0)
Drawn by: 347641
FIO2: 0.4 %
O2 SAT: 94.5 %
PATIENT TEMPERATURE: 98.4
PEEP/CPAP: 5 cmH2O
RATE: 35 resp/min
TCO2: 25.9 mmol/L (ref 0–100)
VT: 590 mL
pCO2 arterial: 32.8 mmHg — ABNORMAL LOW (ref 35.0–45.0)
pH, Arterial: 7.491 — ABNORMAL HIGH (ref 7.350–7.450)
pO2, Arterial: 61.7 mmHg — ABNORMAL LOW (ref 80.0–100.0)

## 2013-12-25 LAB — GLUCOSE, CAPILLARY
Glucose-Capillary: 218 mg/dL — ABNORMAL HIGH (ref 70–99)
Glucose-Capillary: 190 mg/dL — ABNORMAL HIGH (ref 70–99)
Glucose-Capillary: 188 mg/dL — ABNORMAL HIGH (ref 70–99)
Glucose-Capillary: 172 mg/dL — ABNORMAL HIGH (ref 70–99)
Glucose-Capillary: 175 mg/dL — ABNORMAL HIGH (ref 70–99)
Glucose-Capillary: 128 mg/dL — ABNORMAL HIGH (ref 70–99)

## 2013-12-25 LAB — MAGNESIUM: MAGNESIUM: 2.4 mg/dL (ref 1.5–2.5)

## 2013-12-25 LAB — MYOGLOBIN, URINE: Myoglobin, Ur: <27 mcg/L (ref <28)

## 2013-12-25 LAB — HEMOGLOBIN AND HEMATOCRIT, BLOOD
HCT: 36.1 % — ABNORMAL LOW (ref 39.0–52.0)
Hemoglobin: 11.9 g/dL — ABNORMAL LOW (ref 13.0–17.0)

## 2013-12-25 LAB — PHOSPHORUS: Phosphorus: 1.6 mg/dL — ABNORMAL LOW (ref 2.3–4.6)

## 2013-12-25 MED ORDER — POTASSIUM CHLORIDE 20 MEQ/15ML (10%) PO LIQD
40.0000 meq | Freq: Once | ORAL | Status: AC
  Administered 2013-12-25: 40 meq via ORAL
  Filled 2013-12-25: qty 30

## 2013-12-25 MED ORDER — DEXTROSE 5 % IV SOLN
2.0000 ug/min | INTRAVENOUS | Status: DC
  Administered 2013-12-27: 1 ug/min via INTRAVENOUS
  Administered 2013-12-27: 3 ug/min via INTRAVENOUS
  Filled 2013-12-25: qty 16

## 2013-12-25 MED ORDER — INSULIN GLARGINE 100 UNIT/ML ~~LOC~~ SOLN
25.0000 [IU] | Freq: Every day | SUBCUTANEOUS | Status: DC
  Administered 2013-12-26: 25 [IU] via SUBCUTANEOUS
  Filled 2013-12-25 (×2): qty 0.25

## 2013-12-25 MED ORDER — POTASSIUM PHOSPHATE DIBASIC 3 MMOLE/ML IV SOLN
20.0000 mmol | Freq: Once | INTRAVENOUS | Status: AC
  Administered 2013-12-25: 20 mmol via INTRAVENOUS
  Filled 2013-12-25: qty 6.67

## 2013-12-25 MED ORDER — CIPROFLOXACIN IN D5W 400 MG/200ML IV SOLN
400.0000 mg | Freq: Two times a day (BID) | INTRAVENOUS | Status: DC
  Administered 2013-12-25 – 2014-01-03 (×19): 400 mg via INTRAVENOUS
  Filled 2013-12-25 (×21): qty 200

## 2013-12-25 NOTE — Progress Notes (Signed)
[facility] Electrolyte Replacement Protocol  Patient Name: Helmer Dull DOB: 02-21-1931 MRN: [id number]  Date of Service  12/25/2013   HPI/Events of Note    Recent Labs Lab 12/21/13 2149 12/22/13 0200  12/23/13 0400 12/23/13 1000 12/23/13 1830 12/24/13 0230 12/24/13 1300 12/25/13 0200 12/25/13 0445  NA 158* 159*  158*  < >  --  148* 145 146 145 148*  --   K 4.5 5.1  5.0  < >  --  4.1 3.7 3.6* 4.2 3.6*  --   CL 122* 123*  121*  < >  --  115* 112 110 107 109  --   CO2  --  16*  17*  < >  --  $R'19 19 23 24 27  'ti$ --   GLUCOSE 119* 134*  151*  < >  --  216* 300* 290* 261* 215*  --   BUN >140* 163*  165*  < >  --  125* 106* 103* 93* 87*  --   CREATININE 4.20* 4.06*  4.01*  < >  --  2.57* 2.03* 1.99* 1.85* 1.72*  --   CALCIUM  --  7.6*  8.3*  < >  --  6.9* 6.5* 6.7* 6.9* 7.0*  --   MG  --  3.0*  --  2.5  --   --  2.3  --   --  2.4  PHOS  --  8.4*  --  4.2  --   --  1.7*  --   --  1.6*  < > = values in this interval not displayed.  Estimated Creatinine Clearance: 38.5 ml/min (by C-G formula based on Cr of 1.72).  Intake/Output     02/19 0701 - 02/20 0700   I.V. (mL/kg) 1476.2 (16.8)   NG/GT 2020   IV Piggyback 741.7   Total Intake(mL/kg) 4237.9 (48.3)   Urine (mL/kg/hr) 1145 (0.5)   Total Output 1145   Net +3092.9       Stool Occurrence 1 x    - I/O DETAILED x24h    Total I/O In: 1789.1 [I.V.:761.6; NG/GT:940; IV Piggyback:87.5] Out: 295 [Urine:295] - I/O THIS SHIFT    ASSESSMENT   eICURN Interventions  Electrolyte protocol criteria not met. Value not replaced due to renal status per protocol    ASSESSMENT: [place], Jameon Deller Nicole 12/25/2013, 6:41 AM

## 2013-12-25 NOTE — Progress Notes (Signed)
ANTIBIOTIC CONSULT NOTE - FOLLOW UP  Pharmacy Consult for ciprofloxacin Indication: pneumonia  No Known Allergies  Patient Measurements: Height: 6' 0.83" (185 cm) Weight: 193 lb 9 oz (87.8 kg) IBW/kg (Calculated) : 79.52  Vital Signs: Temp: 98.8 F (37.1 C) (02/20 0840) Temp src: Oral (02/20 0840) BP: 159/68 mmHg (02/20 1000) Pulse Rate: 106 (02/20 1000) Intake/Output from previous day: 02/19 0701 - 02/20 0700 In: 4558.5 [I.V.:1624.3; NG/GT:2160; IV Piggyback:754.2] Out: 1415 [Urine:1415] Intake/Output from this shift: Total I/O In: 667.8 [I.V.:223.8; NG/GT:360; IV Piggyback:84] Out: 145 [Urine:145]  Labs:  Recent Labs  12/23/13 0400  12/24/13 0230 12/24/13 1300 12/25/13 0001 12/25/13 0200  WBC 13.6*  --  11.7*  --   --   --   HGB 14.4  --  12.4*  --  11.9*  --   PLT 292  --  161  --   --   --   CREATININE  --   < > 1.99* 1.85*  --  1.72*  < > = values in this interval not displayed. Estimated Creatinine Clearance: 38.5 ml/min (by C-G formula based on Cr of 1.72). No results found for this basename: VANCOTROUGH, VANCOPEAK, VANCORANDOM, GENTTROUGH, GENTPEAK, GENTRANDOM, TOBRATROUGH, TOBRAPEAK, TOBRARND, AMIKACINPEAK, AMIKACINTROU, AMIKACIN,  in the last 72 hours   Microbiology: Recent Results (from the past 720 hour(s))  CULTURE, BLOOD (ROUTINE X 2)     Status: None   Collection Time    12/21/13 11:52 PM      Result Value Ref Range Status   Specimen Description BLOOD RIGHT HAND   Final   Special Requests     Final   Value: BOTTLES DRAWN AEROBIC AND ANAEROBIC 10CC BLUE 5CC RED   Culture  Setup Time     Final   Value: 12/22/2013 07:56     Performed at [facility]   Culture     Final   Value:        BLOOD CULTURE RECEIVED NO GROWTH TO DATE CULTURE WILL BE HELD FOR 5 DAYS BEFORE ISSUING A FINAL NEGATIVE REPORT     Performed at [facility]   Report Status PENDING   Incomplete  CULTURE, RESPIRATORY (NON-EXPECTORATED)     Status: None   Collection Time    12/22/13  2:20 AM      Result Value Ref Range Status   Specimen Description TRACHEAL ASPIRATE   Final   Special Requests NONE   Final   Gram Stain     Final   Value: MODERATE WBC PRESENT, PREDOMINANTLY PMN     NO SQUAMOUS EPITHELIAL CELLS SEEN     MODERATE GRAM POSITIVE RODS     FEW GRAM NEGATIVE RODS     RARE YEAST   Culture     Final   Value: ABUNDANT PSEUDOMONAS AERUGINOSA     MODERATE YEAST CONSISTENT WITH CANDIDA SPECIES     Performed at [facility]   Report Status 12/24/2013 FINAL   Final   Organism ID, Bacteria PSEUDOMONAS AERUGINOSA   Final  URINE CULTURE     Status: None   Collection Time    12/22/13  2:56 AM      Result Value Ref Range Status   Specimen Description URINE, RANDOM   Final   Special Requests NONE   Final   Culture  Setup Time     Final   Value: 12/22/2013 08:04     Performed at [place]     Final  Value: NO GROWTH     Performed at [facility]     Final   Value: NO GROWTH     Performed at [facility]   Report Status 12/22/2013 FINAL   Final  MRSA PCR SCREENING     Status: None   Collection Time    12/22/13  2:56 AM      Result Value Ref Range Status   MRSA by PCR NEGATIVE  NEGATIVE Final   Comment:            The GeneXpert MRSA Assay (FDA     approved for NASAL specimens     only), is one component of a     comprehensive MRSA colonization     surveillance program. It is not     intended to diagnose MRSA     infection nor to guide or     monitor treatment for     MRSA infections.    Anti-infectives   Start     Dose/Rate Route Frequency Ordered Stop   12/25/13 1200  ciprofloxacin (CIPRO) IVPB 400 mg     400 mg 200 mL/hr over 60 Minutes Intravenous Every 12 hours 12/25/13 1054 01/06/14 1159   12/23/13 1100  piperacillin-tazobactam (ZOSYN) IVPB 3.375 g  Status:  Discontinued     3.375 g 12.5 mL/hr over 240 Minutes Intravenous Every 8 hours 12/23/13 1052  12/25/13 1054   12/22/13 0215  ampicillin-sulbactam (UNASYN) 1.5 g in sodium chloride 0.9 % 50 mL IVPB  Status:  Discontinued     1.5 g 100 mL/hr over 30 Minutes Intravenous Every 12 hours 12/22/13 0201 12/23/13 1051   12/22/13 0115  piperacillin-tazobactam (ZOSYN) IVPB 3.375 g     3.375 g 100 mL/hr over 30 Minutes Intravenous  Once 12/22/13 0113 12/24/13 2004   12/22/13 0045  cefTRIAXone (ROCEPHIN) 1 g in dextrose 5 % 50 mL IVPB  Status:  Discontinued     1 g 100 mL/hr over 30 Minutes Intravenous Every 24 hours 12/22/13 0043 12/22/13 1056   12/21/13 2330  cefTRIAXone (ROCEPHIN) 1 g in dextrose 5 % 50 mL IVPB  Status:  Discontinued     1 g 100 mL/hr over 30 Minutes Intravenous Every 24 hours 12/21/13 2317 12/21/13 2320      Assessment: 82 YOM admitted from home after being found down. Pan sensisitive pseuduomonas grew out in respiratory culture. Was started on Zosyn 2/18, now with sensitivities to change to ciprofloxacin. SCr gradually improving, today 1.72 with est CrCl ~38mL/min. WBC decr to 11.7, Tmax in last 24 hours 100.5.  Goal of Therapy:  Eradication of infection  Plan:  1. Ciprofloxacin 400mg  IV q12h for a total of 14 days of therapy (started on the 18th)- last dose 3/4 at [place] 2. Follow renal function and adjust dose if it decreases  Zakkery Dorian D. Jese Comella, PharmD, BCPS Clinical Pharmacist Pager: [phone] 12/25/2013 11:03 AM

## 2013-12-25 NOTE — Progress Notes (Addendum)
Physical Therapy Wound Treatment Patient Details  Name: Shane Pope MRN: [id number] Date of Birth: 05/18/1931  Today's Date: 12/28/2013 Time:  -     Subjective  Subjective: Pt talking but difficult to understand due to mumbling and ventimask.  Pain Score:  No nonverbal signs of pain.  Wound Assessment  Pressure Ulcer 12/22/13 Unstageable - Full thickness tissue loss in which the base of the ulcer is covered by slough (yellow, tan, gray, green or brown) and/or eschar (tan, brown or black) in the wound bed. red (Active)  Dressing Type Moist to dry;santyl; Foam;Other (Comment) 12/25/2013 11:48 AM  Dressing Changed 12/25/2013 11:48 AM  Dressing Change Frequency Daily 12/25/2013 11:48 AM  State of Healing Eschar 12/25/2013 11:48 AM  Site / Wound Assessment Black 12/25/2013 11:48 AM  % Wound base Red or Granulating 50% 12/25/2013 11:48 AM  % Wound base Yellow 0% 12/25/2013 11:48 AM  % Wound base Black 50% 12/25/2013 11:48 AM  % Wound base Other (Comment) 0% 12/25/2013 11:48 AM  Peri-wound Assessment Purple 12/25/2013 11:48 AM  Wound Length (cm) 5.5 cm 12/25/2013 11:48 AM  Wound Width (cm) 9.5 cm 12/25/2013 11:48 AM  Margins Unattached edges (unapproximated) 12/23/2013 10:00 PM  Drainage Amount Scant 12/25/2013 11:48 AM  Treatment Debridement (Selective);Hydrotherapy (Pulse lavage);Other (Comment) 12/25/2013 11:48 AM   Hydrotherapy Pulsed lavage therapy - wound location: sacrum Pulsed Lavage with Suction (psi): 12 psi (8-12) Pulsed Lavage with Suction - Normal Saline Used: 1000 mL Pulsed Lavage Tip: Tip with splash shield Selective Debridement Selective Debridement - Location: sacrum Selective Debridement - Tools Used: Forceps;Scalpel;Scissors Selective Debridement - Tissue Removed: black eschar, scored eschar    Wound Assessment and Plan  Wound Therapy - Assess/Plan/Recommendations Wound Therapy - Clinical Statement: Eschar of wound beginning to loosen at edges.  Hydrotherapy Plan:  Debridement;Dressing change;Patient/family education;Pulsatile lavage with suction Wound Therapy - Frequency: 3X / week Wound Therapy - Follow Up Recommendations: Skilled nursing facility Wound Plan: See above  Wound Therapy Goals- Improve the function of patient's integumentary system by progressing the wound(s) through the phases of wound healing (inflammation - proliferation - remodeling) by:    Goals will be updated until maximal potential achieved or discharge criteria met.  Discharge criteria: when goals achieved, discharge from hospital, MD decision/surgical intervention, no progress towards goals, refusal/missing three consecutive treatments without notification or medical reason.  GP     Shane Pope 12/28/2013, 10:46 AM  Suanne Marker PT [phone]

## 2013-12-25 NOTE — Progress Notes (Signed)
UR Completed.  Shane Pope Jane [phone] 12/25/2013  

## 2013-12-25 NOTE — Progress Notes (Signed)
Name: Shane Pope MRN: [id number] DOB: 02/14/1931    ADMISSION DATE:  12/21/2013 CONSULTATION DATE:  2/16  REFERRING MD :  [facility]  PRIMARY SERVICE: PCCM   CHIEF COMPLAINT:  Acute renal failure, resp failure  BRIEF PATIENT DESCRIPTION:  80 year old male found on floor by family. Confused, unable to get up. In ER found to be hypotensive and in acute renal failure w/ profound metabolic derangements. PCCM asked to admit   SIGNIFICANT EVENTS / STUDIES:  Lumbar spine 2/16: no acute fx Thoracic spine 2/16: no acute fx, old L1 compression fx w/ vertebroplasty  CT head 2/16: negative  Pelvis 2/16: neg  Renal US 2/17: no hydro or obstruction 2/17 - high pressor needs, acidotic 2/18 - Echo 30-35%, akinesis of distal anteroseptal myocardium, grade 1 diastolic dysfunction, PA pressure mildly-moderately increased 2/19- pressors improved 2/20 - pressors continue to improve  LINES / TUBES: 2/16 rt ij>>> 2/16 ett>>> 2/17 rt rad>>>  CULTURES: BCX 2 2/16>>> NGTD UC 2/16>>> neg Resp Cx>>>abundant pseudomonas, pan sensitive  ANTIBIOTICS: 2/16 unasyn>>>2/18 2/18 zosyn>>>2/20 2/20 cipro>>>plan 14 days 2/18 vanc>>>2/19 2/16 ceftriaxone>>>2/17  SUBJECTIVE:  Temp to 100.5 overnight. Levophed improved.   VITAL SIGNS: Temp:  [97.9 F (36.6 C)-100.5 F (38.1 C)] 98.4 F (36.9 C) (02/20 0348) Pulse Rate:  [57-119] 96 (02/20 0645) Resp:  [17-35] 29 (02/20 0645) BP: (82-187)/(42-100) 161/65 mmHg (02/20 0645) SpO2:  [89 %-98 %] 98 % (02/20 0645) Arterial Line BP: (117-142)/(37-50) 117/47 mmHg (02/19 1700) FiO2 (%):  [40 %] 40 % (02/20 0400) Weight:  [193 lb 9 oz (87.8 kg)] 193 lb 9 oz (87.8 kg) (02/20 0430) HEMODYNAMICS: CVP:  [10 mmHg-30 mmHg] 30 mmHg VENTILATOR SETTINGS: Vent Mode:  [-] PRVC FiO2 (%):  [40 %] 40 % Set Rate:  [35 bmp] 35 bmp Vt Set:  [590 mL] 590 mL PEEP:  [5 cmH20] 5 cmH20 Pressure Support:  [5 cmH20] 5 cmH20 Plateau Pressure:  [14 cmH20-20 cmH20]  20 cmH20 INTAKE / OUTPUT: Intake/Output     02/19 0701 - 02/20 0700 02/20 0701 - 02/21 0700   I.V. (mL/kg) 1549.7 (17.7)    Other 20    NG/GT 2090    IV Piggyback 754.2    Total Intake(mL/kg) 4413.9 (50.3)    Urine (mL/kg/hr) 1415 (0.7)    Total Output 1415     Net +2998.9          Stool Occurrence 1 x      PHYSICAL EXAMINATION: General:  Weak, frail elderly male Neuro:  Sedated, will awaken to voice, nods head to questions HEENT:  ETT in place CV: rrr, no murmur appreciated Lungs:  CTA Abdomen:  Non-tender + bowel sounds Musculoskeletal: low muscle mass  Skin:  Bilateral toes less purple, extremities warm, left radial pulse 2+, skin on left arm covered with pink pad  LABS:  CBC  Recent Labs Lab 12/22/13 0200 12/23/13 0400 12/24/13 0230 12/25/13 0001  WBC 7.4  8.2 13.6* 11.7*  --   HGB 13.7  14.6 14.4 12.4* 11.9*  HCT 41.2  43.9 44.8 37.4* 36.1*  PLT 196  229 292 161  --    Coag's No results found for this basename: APTT, INR,  in the last 168 hours BMET  Recent Labs Lab 12/24/13 0230 12/24/13 1300 12/25/13 0200  NA 146 145 148*  K 3.6* 4.2 3.6*  CL 110 107 109  CO2 23 24 27   BUN 103* 93* 87*  CREATININE 1.99* 1.85* 1.72*  GLUCOSE 290*  261* 215*   Electrolytes  Recent Labs Lab 12/23/13 0400  12/24/13 0230 12/24/13 1300 12/25/13 0200 12/25/13 0445  CALCIUM  --   < > 6.7* 6.9* 7.0*  --   MG 2.5  --  2.3  --   --  2.4  PHOS 4.2  --  1.7*  --   --  1.6*  < > = values in this interval not displayed. Sepsis Markers  Recent Labs Lab 12/21/13 2359 12/22/13 1200  LATICACIDVEN  --  1.8  PROCALCITON 0.20  --    ABG  Recent Labs Lab 12/24/13 0345 12/24/13 0740 12/25/13 0418  PHART 7.417 7.356 7.491*  PCO2ART 35.5 25.2* 32.8*  PO2ART 68.2* 61.0* 61.7*   Liver Enzymes  Recent Labs Lab 12/21/13 2138  AST 31  ALT 28  ALKPHOS 51  BILITOT 1.0  ALBUMIN 2.6*   Cardiac Enzymes  Recent Labs Lab 12/21/13 2138 12/22/13 0416  12/22/13 0425 12/22/13 1040  TROPONINI <0.30 <0.30  --  0.42*  PROBNP  --   --  418.4  --    Glucose  Recent Labs Lab 12/24/13 0833 12/24/13 1118 12/24/13 1558 12/24/13 1906 12/25/13 0031 12/25/13 0347  GLUCAP 261* 247* 217* 202* 218* 190*    Imaging Dg Chest Port 1 View  12/25/2013   CLINICAL DATA:  Intubated.  EXAM: PORTABLE CHEST - 1 VIEW  COMPARISON:  12/24/2013  FINDINGS: The patient is rotated to the right. Endotracheal tube is unchanged in position with tip at the inferior margin of the clavicular heads. Enteric tube courses into the upper abdomen with tip not imaged. Right jugular central venous catheter is unchanged with tip overlying the mid to lower SVC. Cardiomediastinal silhouette is unchanged. Diffuse interstitial opacities do not appear significantly changed. More focal infiltrate in the left base is also unchanged. No large pleural effusion is identified.  IMPRESSION: Unchanged appearance of support lines and tubes. Unchanged bilateral interstitial opacities, which could reflect mild edema, with more focal infiltrate in the left base.   Electronically Signed   By: Logan Bores   On: 12/25/2013 08:01   Dg Chest Port 1 View  12/24/2013   CLINICAL DATA:  Check endotracheal tube  EXAM: PORTABLE CHEST - 1 VIEW  COMPARISON:  2/18/fifth  FINDINGS: Right jugular line is again identified and stable. An endotracheal tube is noted 6 cm above the carina stable in appearance from the prior exam. A nasogastric catheter is noted within the stomach. The cardiac shadow is stable. Increasing left basilar infiltrate is noted. Diffuse interstitial changes are again seen.  IMPRESSION: Increasing left basilar infiltrate.   Electronically Signed   By: Inez Catalina M.D.   On: 12/24/2013 07:19     CXR: improved interstitial infiltrates , bad rotation, ett wnl  ASSESSMENT / PLAN:  PULMONARY A: Acute resp failure Pseudomonas PNA ARDS P:   Vent FiO2 40% PEEP 5 RR 35 Tv 590 SBT today, ABG  reviewed, will need to decrease his rate after SBT, rate goal 24 cpap 5 ps 5 x 1 hr, assess extubation neurostatus pcxr in am  abg in am Await improved neuro to extubate if weaning successful  CARDIOVASCULAR A: Hypovolemia, shock, r/o septic compoenent, cardiac (svo2 adequate) No REL AI Mild at best stress ischemia Echo 30-35%, akinesis of distal anteroseptal myocardium, grade 1 diastolic dysfunction, PA pressure mildly-moderately increased P:  cvp documented 30, will need to recheck now and dc this Try to keep close to even at this time Wean pressors as able -  levophed and vasopressin (dc) Goal MAP 60  RENAL A:   Acute renal failure - stable Metabolic acidosis + anion gap mild, a larger NON AG - resolved Hypokalemia Hypophosphatemia Hypocalcemia Dehydration   P:   Continue 1/2 NS, add free water 200 mL q6 hr  Hold antihypertensives  Replete potassium, phosphate   strick I&O +3 L yesterday, will try to keep more even today BMET in am  No lasix  GASTROINTESTINAL A:  NPO FOBT +, no gross blood P:    Continue TF Protonix   HEMATOLOGIC A: Anemia - likely dilutional - stable P:  Trend CBC  SCDs with + FOBT -outpt work up PPI BID  INFECTIOUS A:  aspiration, decub / arm wound Procalcitonin 0.20 Pseudomonal PNA P:   Could transition zosyn to cipro given sensitivities, and plan 14 days from zosyn included Wound care ensure good pulses to arm  ENDOCRINE A:  H/O DM  TSH 0.816 - WNL No relative AI P:   SSI Lantus to 25  NEUROLOGIC A:  acute encephalopathy P:   Wean off fentanyl as able Versed prn, dc from MAR rass to goal -1 WUA upright  DERM A: sacral pressure ulcer: unstageable      Pressure ulcer right arm: unstageable  P: WOC consult appreciated   Daughter updated  Tommi Rumps, MD  [facility] PGY-2  TODAY'S SUMMARY: wean off fentanyl, SBT this morning, transition zosyn to cipro, wean pressors   I have personally  obtained a history, examined the patient, evaluated laboratory and imaging results, formulated the assessment and plan and placed orders.  CRITICAL CARE: The patient is critically ill with multiple organ systems failure and requires high complexity decision making for assessment and support, frequent evaluation and titration of therapies, application of advanced monitoring technologies and extensive interpretation of multiple databases. Critical Care Time devoted to patient care services described in this note is 30 minutes.   Lavon Paganini. Titus Mould, MD, [place] Pgr: [facility]

## 2013-12-25 NOTE — Procedures (Signed)
Patient seen and examined, agree with above note.  I dictated the care and orders written for this patient under my direction.  Wesam G Yacoub, MD [phone] 

## 2013-12-26 ENCOUNTER — Inpatient Hospital Stay (HOSPITAL_COMMUNITY): Payer: Medicare Other

## 2013-12-26 LAB — BASIC METABOLIC PANEL
BUN: 69 mg/dL — ABNORMAL HIGH (ref 6–23)
BUN: 65 mg/dL — AB (ref 6–23)
BUN: 65 mg/dL — AB (ref 6–23)
CHLORIDE: 107 meq/L (ref 96–112)
CHLORIDE: 110 meq/L (ref 96–112)
CHLORIDE: 109 meq/L (ref 96–112)
CO2: 27 mEq/L (ref 19–32)
CO2: 29 mEq/L (ref 19–32)
CO2: 27 mEq/L (ref 19–32)
Calcium: 7 mg/dL — ABNORMAL LOW (ref 8.4–10.5)
Calcium: 7.3 mg/dL — ABNORMAL LOW (ref 8.4–10.5)
Calcium: 6.8 mg/dL — ABNORMAL LOW (ref 8.4–10.5)
Creatinine, Ser: 1.47 mg/dL — ABNORMAL HIGH (ref 0.50–1.35)
Creatinine, Ser: 1.58 mg/dL — ABNORMAL HIGH (ref 0.50–1.35)
Creatinine, Ser: 1.58 mg/dL — ABNORMAL HIGH (ref 0.50–1.35)
GFR calc Af Amer: 50 mL/min — ABNORMAL LOW (ref >90)
GFR calc Af Amer: 46 mL/min — ABNORMAL LOW (ref >90)
GFR calc non Af Amer: 43 mL/min — ABNORMAL LOW (ref >90)
GFR calc non Af Amer: 40 mL/min — ABNORMAL LOW (ref >90)
GFR, EST AFRICAN AMERICAN: 46 mL/min — AB (ref >90)
GFR, EST NON AFRICAN AMERICAN: 40 mL/min — AB (ref >90)
GLUCOSE: 155 mg/dL — AB (ref 70–99)
GLUCOSE: 103 mg/dL — AB (ref 70–99)
Glucose, Bld: 136 mg/dL — ABNORMAL HIGH (ref 70–99)
POTASSIUM: 3.9 meq/L (ref 3.7–5.3)
Potassium: 4.1 mEq/L (ref 3.7–5.3)
Potassium: 3.9 mEq/L (ref 3.7–5.3)
Sodium: 145 mEq/L (ref 137–147)
Sodium: 148 mEq/L — ABNORMAL HIGH (ref 137–147)
Sodium: 145 mEq/L (ref 137–147)

## 2013-12-26 LAB — BLOOD GAS, ARTERIAL
Acid-Base Excess: 2.8 mmol/L — ABNORMAL HIGH (ref 0.0–2.0)
Acid-Base Excess: 2.8 mmol/L — ABNORMAL HIGH (ref 0.0–2.0)
BICARBONATE: 26.7 meq/L — AB (ref 20.0–24.0)
Bicarbonate: 26.8 mEq/L — ABNORMAL HIGH (ref 20.0–24.0)
Drawn by: 28340
Drawn by: 34779
FIO2: 0.4 %
LHR: 24 {breaths}/min
MECHVT: 590 mL
O2 Content: 15 L/min
O2 SAT: 92.1 %
O2 SAT: 98.8 %
PEEP: 5 cmH2O
PO2 ART: 117 mmHg — AB (ref 80.0–100.0)
Patient temperature: 98.6
Patient temperature: 98.6
TCO2: 28.1 mmol/L (ref 0–100)
TCO2: 27.9 mmol/L (ref 0–100)
pCO2 arterial: 41.4 mmHg (ref 35.0–45.0)
pCO2 arterial: 40.3 mmHg (ref 35.0–45.0)
pH, Arterial: 7.427 (ref 7.350–7.450)
pH, Arterial: 7.436 (ref 7.350–7.450)
pO2, Arterial: 60.8 mmHg — ABNORMAL LOW (ref 80.0–100.0)

## 2013-12-26 LAB — GLUCOSE, CAPILLARY
GLUCOSE-CAPILLARY: 121 mg/dL — AB (ref 70–99)
Glucose-Capillary: 133 mg/dL — ABNORMAL HIGH (ref 70–99)
Glucose-Capillary: 135 mg/dL — ABNORMAL HIGH (ref 70–99)
Glucose-Capillary: 148 mg/dL — ABNORMAL HIGH (ref 70–99)
Glucose-Capillary: 148 mg/dL — ABNORMAL HIGH (ref 70–99)
Glucose-Capillary: 89 mg/dL (ref 70–99)

## 2013-12-26 LAB — CBC
HEMATOCRIT: 35.4 % — AB (ref 39.0–52.0)
Hemoglobin: 11.2 g/dL — ABNORMAL LOW (ref 13.0–17.0)
MCH: 28.5 pg (ref 26.0–34.0)
MCHC: 31.6 g/dL (ref 30.0–36.0)
MCV: 90.1 fL (ref 78.0–100.0)
Platelets: 119 10*3/uL — ABNORMAL LOW (ref 150–400)
RBC: 3.93 MIL/uL — ABNORMAL LOW (ref 4.22–5.81)
RDW: 16.5 % — AB (ref 11.5–15.5)
WBC: 9.1 10*3/uL (ref 4.0–10.5)

## 2013-12-26 LAB — PHOSPHORUS: Phosphorus: 2.7 mg/dL (ref 2.3–4.6)

## 2013-12-26 LAB — MAGNESIUM: MAGNESIUM: 2.1 mg/dL (ref 1.5–2.5)

## 2013-12-26 MED ORDER — FUROSEMIDE 10 MG/ML IJ SOLN: 40.0000 mg | Freq: Three times a day (TID) | INTRAMUSCULAR | Status: DC

## 2013-12-26 MED ORDER — SODIUM CHLORIDE 0.9 % IV BOLUS (SEPSIS)
500.0000 mL | Freq: Once | INTRAVENOUS | Status: AC
  Administered 2013-12-26: 500 mL via INTRAVENOUS

## 2013-12-26 MED ORDER — DEXMEDETOMIDINE HCL IN NACL 200 MCG/50ML IV SOLN
0.4000 ug/kg/h | INTRAVENOUS | Status: DC
  Administered 2013-12-26: 0.5 ug/kg/h via INTRAVENOUS
  Administered 2013-12-26: 0.502 ug/kg/h via INTRAVENOUS
  Administered 2013-12-26: 0.2 ug/kg/h via INTRAVENOUS
  Filled 2013-12-26 (×3): qty 50

## 2013-12-26 MED ORDER — POTASSIUM CHLORIDE 20 MEQ/15ML (10%) PO LIQD
20.0000 meq | ORAL | Status: AC
  Administered 2013-12-26 (×2): 20 meq
  Filled 2013-12-26: qty 15

## 2013-12-26 MED ORDER — FUROSEMIDE 10 MG/ML IJ SOLN: 40.0000 mg | Freq: Two times a day (BID) | INTRAMUSCULAR | Status: DC

## 2013-12-26 MED ORDER — FUROSEMIDE 10 MG/ML IJ SOLN
60.0000 mg | Freq: Three times a day (TID) | INTRAMUSCULAR | Status: DC
  Administered 2013-12-26: 60 mg via INTRAVENOUS
  Filled 2013-12-26: qty 6

## 2013-12-26 MED ORDER — FUROSEMIDE 10 MG/ML IJ SOLN
120.0000 mg | Freq: Three times a day (TID) | INTRAVENOUS | Status: DC
  Administered 2013-12-26: 120 mg via INTRAVENOUS
  Filled 2013-12-26 (×2): qty 12

## 2013-12-26 NOTE — Progress Notes (Signed)
Pt bp has dropped to sbp's in the 70s with MAPs in the 40s.  Resituated cuff, applied new cuff and compared bilat.  Called Dr. Caryl Bis who advised to give 500cc bolus and restart Levophed.  Will continue to assess.

## 2013-12-26 NOTE — Progress Notes (Signed)
RT called to room for self-extubation.  Pt deep oral suctioned and ETT and holder removed from pt.  [employer] notified.  Pt placed on NRB mask and NT suctioned.  Pt oxygen saturations currently 91%, RR 28, HR 80.  RT will monitor.

## 2013-12-26 NOTE — Progress Notes (Signed)
[facility] ICU Electrolyte Replacement Protocol  Patient Name: Shane Pope DOB: November 05, 1931 MRN: [id number]  Date of Service  12/26/2013   HPI/Events of Note    Recent Labs Lab 12/21/13 2149 12/22/13 0200  12/23/13 0400  12/24/13 0230 12/24/13 1300 12/25/13 0200 12/25/13 0445 12/25/13 1800 12/26/13 0159  NA 158* 159*  158*  < >  --   < > 146 145 148*  --  146 145  K 4.5 5.1  5.0  < >  --   < > 3.6* 4.2 3.6*  --  3.9 3.9  CL 122* 123*  121*  < >  --   < > 110 107 109  --  107 107  CO2  --  16*  17*  < >  --   < > $R'23 24 27  'dj$ --  27 27  GLUCOSE 119* 134*  151*  < >  --   < > 290* 261* 215*  --  120* 155*  BUN >140* 163*  165*  < >  --   < > 103* 93* 87*  --  69* 69*  CREATININE 4.20* 4.06*  4.01*  < >  --   < > 1.99* 1.85* 1.72*  --  1.47* 1.47*  CALCIUM  --  7.6*  8.3*  < >  --   < > 6.7* 6.9* 7.0*  --  7.2* 7.0*  MG  --  3.0*  --  2.5  --  2.3  --   --  2.4  --   --   PHOS  --  8.4*  --  4.2  --  1.7*  --   --  1.6*  --   --   < > = values in this interval not displayed.  Estimated Creatinine Clearance: 45.1 ml/min (by C-G formula based on Cr of 1.47).  Intake/Output     02/20 0701 - 02/21 0700   I.V. (mL/kg) 1588 (17.8)   NG/GT 2730   IV Piggyback 368   Total Intake(mL/kg) 4686 (52.5)   Urine (mL/kg/hr) 1560 (0.7)   Total Output 1560   Net +3126        - I/O DETAILED x24h    Total I/O In: 2292.2 [I.V.:1152.2; NG/GT:940; IV Piggyback:200] Out: 615 [Urine:615] - I/O THIS SHIFT    ASSESSMENT   eICURN Interventions  Electrolyte protocol criteria not met. Value replaced using protocol dosing per MD request.   ASSESSMENT: MAJOR ELECTROLYTE    Lorene Dy 12/26/2013, 6:34 AM

## 2013-12-26 NOTE — Progress Notes (Signed)
Name: Shane Pope MRN: [id number] DOB: February 17, 1931    ADMISSION DATE:  12/21/2013 CONSULTATION DATE:  2/16  REFERRING MD :  [facility]  PRIMARY SERVICE: PCCM   CHIEF COMPLAINT:  Acute renal failure, resp failure  BRIEF PATIENT DESCRIPTION:  80 year old male found on floor by family. Confused, unable to get up. In ER found to be hypotensive and in acute renal failure w/ profound metabolic derangements. PCCM asked to admit   SIGNIFICANT EVENTS / STUDIES:  Lumbar spine 2/16: no acute fx Thoracic spine 2/16: no acute fx, old L1 compression fx w/ vertebroplasty  CT head 2/16: negative  Pelvis 2/16: neg  Renal US 2/17: no hydro or obstruction 2/17 - high pressor needs, acidotic 2/18 - Echo 30-35%, akinesis of distal anteroseptal myocardium, grade 1 diastolic dysfunction, PA pressure mildly-moderately increased 2/19- pressors improved 2/20 - pressors continue to improve 2/21 - off pressors  LINES / TUBES: 2/16 rt ij>>> 2/16 ett>>> 2/17 rt rad>>>  CULTURES: BCX 2 2/16>>> NGTD UC 2/16>>> neg Resp Cx>>>abundant pseudomonas, pan sensitive  ANTIBIOTICS: 2/16 unasyn>>>2/18 2/18 zosyn>>>2/20 2/20 cipro>>>plan 14 days 2/18 vanc>>>2/19 2/16 ceftriaxone>>>2/17  SUBJECTIVE:  Off pressors. Still on fentanyl. Had to go up on fentanyl overnight given agitation.  VITAL SIGNS: Temp:  [98.2 F (36.8 C)-98.8 F (37.1 C)] 98.5 F (36.9 C) (02/21 0343) Pulse Rate:  [62-115] 115 (02/21 0545) Resp:  [8-33] 22 (02/21 0545) BP: (55-175)/(29-95) 119/66 mmHg (02/21 0545) SpO2:  [88 %-98 %] 94 % (02/21 0545) FiO2 (%):  [40 %] 40 % (02/21 0400) Weight:  [89.2 kg (196 lb 10.4 oz)] 89.2 kg (196 lb 10.4 oz) (02/21 0500) HEMODYNAMICS: CVP:  [1 mmHg-16 mmHg] 16 mmHg VENTILATOR SETTINGS: Vent Mode:  [-] PRVC FiO2 (%):  [40 %] 40 % Set Rate:  [24 bmp-35 bmp] 24 bmp Vt Set:  [590 mL] 590 mL PEEP:  [5 cmH20] 5 cmH20 Pressure Support:  [10 cmH20] 10 cmH20 Plateau Pressure:  [15 cmH20]  15 cmH20 INTAKE / OUTPUT: Intake/Output     02/20 0701 - 02/21 0700   I.V. (mL/kg) 1588 (17.8)   NG/GT 2730   IV Piggyback 368   Total Intake(mL/kg) 4686 (52.5)   Urine (mL/kg/hr) 1560 (0.7)   Total Output 1560   Net +3126         PHYSICAL EXAMINATION: General:  Weak, frail elderly male Neuro:  Sedated, will awaken to voice, nods head to questions, though less alert than yesterday HEENT:  ETT in place CV: rrr, no murmur appreciated Lungs:  CTA Abdomen:  Non-tender + bowel sounds Musculoskeletal: low muscle mass  Skin:  Bilateral toes less purple, left toe tips cold, extremities warm, skin on left arm covered with pink pad  LABS:  CBC  Recent Labs Lab 12/23/13 0400 12/24/13 0230 12/25/13 0001 12/26/13 0425  WBC 13.6* 11.7*  --  9.1  HGB 14.4 12.4* 11.9* 11.2*  HCT 44.8 37.4* 36.1* 35.4*  PLT 292 161  --  PENDING   Coag's No results found for this basename: APTT, INR,  in the last 168 hours BMET  Recent Labs Lab 12/25/13 0200 12/25/13 1800 12/26/13 0159  NA 148* 146 145  K 3.6* 3.9 3.9  CL 109 107 107  CO2 27 27 27   BUN 87* 69* 69*  CREATININE 1.72* 1.47* 1.47*  GLUCOSE 215* 120* 155*   Electrolytes  Recent Labs Lab 12/23/13 0400  12/24/13 0230  12/25/13 0200 12/25/13 0445 12/25/13 1800 12/26/13 0159  CALCIUM  --   < >  6.7*  < > 7.0*  --  7.2* 7.0*  MG 2.5  --  2.3  --   --  2.4  --   --   PHOS 4.2  --  1.7*  --   --  1.6*  --   --   < > = values in this interval not displayed. Sepsis Markers  Recent Labs Lab 12/21/13 2359 12/22/13 1200  LATICACIDVEN  --  1.8  PROCALCITON 0.20  --    ABG  Recent Labs Lab 12/24/13 0740 12/25/13 0418 12/26/13 0330  PHART 7.356 7.491* 7.427  PCO2ART 25.2* 32.8* 41.4  PO2ART 61.0* 61.7* 60.8*   Liver Enzymes  Recent Labs Lab 12/21/13 2138  AST 31  ALT 28  ALKPHOS 51  BILITOT 1.0  ALBUMIN 2.6*   Cardiac Enzymes  Recent Labs Lab 12/21/13 2138 12/22/13 0416 12/22/13 0425 12/22/13 1040   TROPONINI <0.30 <0.30  --  0.42*  PROBNP  --   --  418.4  --    Glucose  Recent Labs Lab 12/25/13 0749 12/25/13 1201 12/25/13 1559 12/25/13 1845 12/26/13 0030 12/26/13 0342  GLUCAP 188* 172* 175* 128* 148* 121*    Imaging Dg Chest Port 1 View  12/25/2013   CLINICAL DATA:  Intubated.  EXAM: PORTABLE CHEST - 1 VIEW  COMPARISON:  12/24/2013  FINDINGS: The patient is rotated to the right. Endotracheal tube is unchanged in position with tip at the inferior margin of the clavicular heads. Enteric tube courses into the upper abdomen with tip not imaged. Right jugular central venous catheter is unchanged with tip overlying the mid to lower SVC. Cardiomediastinal silhouette is unchanged. Diffuse interstitial opacities do not appear significantly changed. More focal infiltrate in the left base is also unchanged. No large pleural effusion is identified.  IMPRESSION: Unchanged appearance of support lines and tubes. Unchanged bilateral interstitial opacities, which could reflect mild edema, with more focal infiltrate in the left base.   Electronically Signed   By: Logan Bores   On: 12/25/2013 08:01     CXR: improved interstitial infiltrate, ett wnl  ASSESSMENT / PLAN:  PULMONARY A: Acute resp failure Pseudomonas PNA ARDS P:   Vent FiO2 40% PEEP 5 RR 24 Tv 590, abg reviewed, reduce rate 18 SBT today, ABG reviewed, improved, potential extubation today cpap 5 ps 5 x 1 hr, assess extubation neurostatus during SBT on precedex pcxr in am, consider lasix abg in am Antibiotics per above  CARDIOVASCULAR A: Hypovolemia, shock, septic No REL AI Mild at best stress ischemia Echo 30-35%, akinesis of distal anteroseptal myocardium, grade 1 diastolic dysfunction, PA pressure mildly-moderately increased P:  Try to keep close to even at this time, consider lasix today given +3 L Pressors weaned off Goal MAP 60, on won successful lasix  RENAL A:   Acute renal failure -  stable Hypophosphatemia Dehydration   overloaded P:   Continue 1/2 NS, free water 150 mL q6 hr , dc Hold antihypertensives  F/u mag and phos   strick I&O +3 L yesterday, will try to keep even today, may need to add lasix BMET in am  k supp  GASTROINTESTINAL A:  NPO FOBT +, no gross blood P:    Continue TF, hold if weaning well Protonix   HEMATOLOGIC A: Anemia - likely dilutional - stable, progressive drop plat with pos balance P:  Trend CBC  SCDs with + FOBT -outpt work up PPI BID Kvo, dc free water lasix  INFECTIOUS A:  aspiration, decub /  arm wound Procalcitonin 0.20 Pseudomonal PNA P:   Antibiotics per above Wound care cipro to stop date  ENDOCRINE A:  H/O DM  TSH 0.816 - WNL No relative AI P:   SSI Lantus 25 Improved cbgs  NEUROLOGIC A:  acute encephalopathy, agitation P:   Wean off fentanyl today rass to goal -1 WUA Upright Add precedex  DERM A: sacral pressure ulcer: unstageable      Pressure ulcer right arm: unstageable  P: WOC consult appreciated  Tommi Rumps, MD  [facility] PGY-2  TODAY'S SUMMARY: wean off fentanyl, SBT this morning, continue cipro, potential extubation, lasix  I have personally obtained a history, examined the patient, evaluated laboratory and imaging results, formulated the assessment and plan and placed orders.  CRITICAL CARE: The patient is critically ill with multiple organ systems failure and requires high complexity decision making for assessment and support, frequent evaluation and titration of therapies, application of advanced monitoring technologies and extensive interpretation of multiple databases. Critical Care Time devoted to patient care services described in this note is 30 minutes.   Lavon Paganini. Titus Mould, MD, [place] Pgr: [facility]

## 2013-12-26 NOTE — Progress Notes (Signed)
[facility] Progress Note Patient Name: Shane Pope DOB: 29-Jun-1931 MRN: [id number]  Date of Service  12/26/2013   HPI/Events of Note  Patient pulled out his ETT.  Placed on NRB mask with sat in low 90's.  Precedex stopped.  Presently eyes closed but will arouse.  Tachypneic.  ronchi diffusely on lung exam.   eICU Interventions  Continue with NRB mask Continue hold precedex ABG in 1 hour Follow closely.  Concerned will need to be re-intubated.     Intervention Category Major Interventions: Other:  Mauri [place], Mamie Nick 12/26/2013, 8:11 PM

## 2013-12-26 NOTE — Progress Notes (Signed)
Interval Progress Note  Received call from nursing that map at 42. Discussed with Dr Tamala Julian. Will d/c lasix, give NS bolus 500 cc, and restart levophed. Will continue to monitor.  Tommi Rumps, MD [facility] PGY-2

## 2013-12-26 NOTE — Progress Notes (Signed)
At 2012, pt self-extubated.  RT and nurses at bedside.  Called Elink and had them camera in.  Advised to place on NRB at 100%, stop precedex and TF, and continue to monitor.  Will follow up with STAT ABG in 1 hour.

## 2013-12-26 NOTE — Plan of Care (Signed)
Problem: Phase I Progression Outcomes Goal: Patient tolerating weaning plan Outcome: Not Applicable Date Met:  92/33/00 Pt self-extubated at 2012

## 2013-12-27 ENCOUNTER — Inpatient Hospital Stay (HOSPITAL_COMMUNITY): Payer: Medicare Other

## 2013-12-27 LAB — BASIC METABOLIC PANEL
BUN: 63 mg/dL — AB (ref 6–23)
BUN: 62 mg/dL — ABNORMAL HIGH (ref 6–23)
BUN: 55 mg/dL — ABNORMAL HIGH (ref 6–23)
CALCIUM: 7.3 mg/dL — AB (ref 8.4–10.5)
CHLORIDE: 108 meq/L (ref 96–112)
CO2: 29 meq/L (ref 19–32)
CO2: 30 mEq/L (ref 19–32)
CO2: 29 meq/L (ref 19–32)
CREATININE: 1.64 mg/dL — AB (ref 0.50–1.35)
Calcium: 7.6 mg/dL — ABNORMAL LOW (ref 8.4–10.5)
Calcium: 7.4 mg/dL — ABNORMAL LOW (ref 8.4–10.5)
Chloride: 107 mEq/L (ref 96–112)
Chloride: 108 mEq/L (ref 96–112)
Creatinine, Ser: 1.66 mg/dL — ABNORMAL HIGH (ref 0.50–1.35)
Creatinine, Ser: 1.48 mg/dL — ABNORMAL HIGH (ref 0.50–1.35)
GFR calc Af Amer: 44 mL/min — ABNORMAL LOW (ref >90)
GFR calc Af Amer: 50 mL/min — ABNORMAL LOW (ref >90)
GFR calc non Af Amer: 37 mL/min — ABNORMAL LOW (ref >90)
GFR calc non Af Amer: 43 mL/min — ABNORMAL LOW (ref >90)
GFR, EST AFRICAN AMERICAN: 43 mL/min — AB (ref >90)
GFR, EST NON AFRICAN AMERICAN: 38 mL/min — AB (ref >90)
GLUCOSE: 114 mg/dL — AB (ref 70–99)
Glucose, Bld: 105 mg/dL — ABNORMAL HIGH (ref 70–99)
Glucose, Bld: 134 mg/dL — ABNORMAL HIGH (ref 70–99)
POTASSIUM: 4.1 meq/L (ref 3.7–5.3)
Potassium: 3.8 mEq/L (ref 3.7–5.3)
Potassium: 3.8 mEq/L (ref 3.7–5.3)
SODIUM: 150 meq/L — AB (ref 137–147)
SODIUM: 150 meq/L — AB (ref 137–147)
Sodium: 148 mEq/L — ABNORMAL HIGH (ref 137–147)

## 2013-12-27 LAB — GLUCOSE, CAPILLARY
GLUCOSE-CAPILLARY: 95 mg/dL (ref 70–99)
Glucose-Capillary: 100 mg/dL — ABNORMAL HIGH (ref 70–99)
Glucose-Capillary: 110 mg/dL — ABNORMAL HIGH (ref 70–99)
Glucose-Capillary: 122 mg/dL — ABNORMAL HIGH (ref 70–99)
Glucose-Capillary: 144 mg/dL — ABNORMAL HIGH (ref 70–99)
Glucose-Capillary: 98 mg/dL (ref 70–99)

## 2013-12-27 LAB — PHOSPHORUS: Phosphorus: 3.6 mg/dL (ref 2.3–4.6)

## 2013-12-27 LAB — CBC
HCT: 36 % — ABNORMAL LOW (ref 39.0–52.0)
HEMOGLOBIN: 11.6 g/dL — AB (ref 13.0–17.0)
MCH: 29.3 pg (ref 26.0–34.0)
MCHC: 32.2 g/dL (ref 30.0–36.0)
MCV: 90.9 fL (ref 78.0–100.0)
Platelets: 128 10*3/uL — ABNORMAL LOW (ref 150–400)
RBC: 3.96 MIL/uL — AB (ref 4.22–5.81)
RDW: 16.2 % — ABNORMAL HIGH (ref 11.5–15.5)
WBC: 9.3 10*3/uL (ref 4.0–10.5)

## 2013-12-27 LAB — TROPONIN I: Troponin I: <0.3 ng/mL (ref <0.30)

## 2013-12-27 LAB — MAGNESIUM: MAGNESIUM: 2 mg/dL (ref 1.5–2.5)

## 2013-12-27 MED ORDER — POTASSIUM CHLORIDE 10 MEQ/100ML IV SOLN
10.0000 meq | INTRAVENOUS | Status: AC
  Administered 2013-12-27 (×4): 10 meq via INTRAVENOUS
  Filled 2013-12-27: qty 100

## 2013-12-27 MED ORDER — DEXTROSE 5 % IV SOLN
INTRAVENOUS | Status: DC
Start: 2013-12-27 — End: 2013-12-30
  Administered 2013-12-27 – 2013-12-29 (×3): via INTRAVENOUS

## 2013-12-27 MED ORDER — PANTOPRAZOLE SODIUM 40 MG PO PACK
40.0000 mg | PACK | Freq: Every day | ORAL | Status: DC
  Administered 2013-12-30: 40 mg
  Filled 2013-12-27 (×4): qty 20

## 2013-12-27 MED ORDER — MAGNESIUM SULFATE 50 % IJ SOLN
2.0000 g | Freq: Once | INTRAMUSCULAR | Status: AC
  Administered 2013-12-27: 2 g via INTRAVENOUS
  Filled 2013-12-27: qty 4

## 2013-12-27 NOTE — Progress Notes (Signed)
Wasted 142mL of Fentanyl.  Witnessed by Josue Hector, RN.

## 2013-12-27 NOTE — Progress Notes (Signed)
[facility] Progress Note Patient Name: Shane Pope DOB: 1930/11/09 MRN: [id number]  Date of Service  12/27/2013   HPI/Events of Note  Persistent hypernatremia   eICU Interventions  Free H2O increased   Intervention Category Minor Interventions: Electrolytes abnormality - evaluation and management  Mauri [place], P 12/27/2013, 7:15 PM

## 2013-12-27 NOTE — Progress Notes (Signed)
Shane Pope experienced a 12 beat run of VTach at 0534.  Dr. Caryl Bis responded to the page and advised to draw a Mg2+ level.  Orders will follow.  Will continue to assess.

## 2013-12-27 NOTE — Progress Notes (Signed)
Interval progress Note  Evaluated patient ~3 hours post-self extubation. Patient appears to be breathing comfortably on non-rebreather. He is asking for [place] to let him leave. His breath sounds are mildly coarse throughout. His pulse is regular rate and rhythm. Will continue to monitor his respiratory status. If decompensates will obtain an ABG and consider re-intubation.  Tommi Rumps, MD [place] PGY-2

## 2013-12-27 NOTE — Progress Notes (Signed)
Name: Shane Pope MRN: [id number] DOB: 09/11/1931    ADMISSION DATE:  12/21/2013 CONSULTATION DATE:  2/16  REFERRING MD :  [facility] medical  PRIMARY SERVICE: PCCM   CHIEF COMPLAINT:  Acute renal failure, resp failure  BRIEF PATIENT DESCRIPTION:  80 year old male found on floor by family. Confused, unable to get up. In ER found to be hypotensive and in acute renal failure w/ profound metabolic derangements. PCCM asked to admit   SIGNIFICANT EVENTS / STUDIES:  Lumbar spine 2/16: no acute fx Thoracic spine 2/16: no acute fx, old L1 compression fx w/ vertebroplasty  CT head 2/16: negative  Pelvis 2/16: neg  Renal US 2/17: no hydro or obstruction 2/17 - high pressor needs, acidotic 2/18 - Echo 30-35%, akinesis of distal anteroseptal myocardium, grade 1 diastolic dysfunction, PA pressure mildly-moderately increased 2/19- pressors improved 2/20 - pressors continue to improve 2/21 - off pressors 2/21 - self extubated, BP low with lasix and back on levophed, neg 2.8 liters  LINES / TUBES: 2/16 rt ij>>> 2/16 ett>>>2/21 2/17 rt rad>>>  CULTURES: BCX 2 2/16>>> NGTD UC 2/16>>> neg Resp Cx>>>abundant pseudomonas, pan sensitive  ANTIBIOTICS: 2/16 unasyn>>>2/18 2/18 zosyn>>>2/20 2/20 cipro>>>plan 14 days 2/18 vanc>>>2/19 2/16 ceftriaxone>>>2/17  SUBJECTIVE:  Patient self extubated overnight. Has done well with his respiratory status since that time. BP became low with map of 42. Lasix was d/c'd and patient given 500 cc bolus NS. Placed back on levophed. Nursing notes had a 12 beat run of vtach this morning.  VITAL SIGNS: Temp:  [98 F (36.7 C)-98.6 F (37 C)] 98.1 F (36.7 C) (02/22 0413) Pulse Rate:  [56-113] 64 (02/22 0530) Resp:  [18-41] 29 (02/22 0530) BP: (62-162)/(27-110) 129/46 mmHg (02/22 0530) SpO2:  [86 %-100 %] 99 % (02/22 0530) FiO2 (%):  [40 %] 40 % (02/21 1930) Weight:  [85.1 kg (187 lb 9.8 oz)] 85.1 kg (187 lb 9.8 oz) (02/22 0442) HEMODYNAMICS:    VENTILATOR SETTINGS: Vent Mode:  [-] PRVC FiO2 (%):  [40 %] 40 % Set Rate:  [18 bmp-24 bmp] 18 bmp PEEP:  [5 cmH20] 5 cmH20 Pressure Support:  [12 cmH20] 12 cmH20 Plateau Pressure:  [9 cmH20-15 cmH20] 9 cmH20 INTAKE / OUTPUT: Intake/Output     02/21 0701 - 02/22 0700   I.V. (mL/kg) 582.2 (6.8)   NG/GT 750   IV Piggyback 950   Total Intake(mL/kg) 2282.2 (26.8)   Urine (mL/kg/hr) 4285 (2.1)   Stool 100 (0)   Total Output 4385   Net -2102.8         PHYSICAL EXAMINATION: General:  Weak, frail elderly male Neuro:  Alert, oriented to person and place HEENT:  NRB on CV: rrr, no murmur appreciated Lungs:  Coarse breath sounds bilaterally Abdomen:  Non-tender + bowel sounds Musculoskeletal: low muscle mass  Skin:  Bilateral toes less purple, toes warm, skin on left arm covered with pink pad  LABS:  CBC  Recent Labs Lab 12/23/13 0400 12/24/13 0230 12/25/13 0001 12/26/13 0425  WBC 13.6* 11.7*  --  9.1  HGB 14.4 12.4* 11.9* 11.2*  HCT 44.8 37.4* 36.1* 35.4*  PLT 292 161  --  119*   Coag's No results found for this basename: APTT, INR,  in the last 168 hours BMET  Recent Labs Lab 12/26/13 1519 12/26/13 2115 12/27/13 0200  NA 148* 145 148*  K 4.1 3.9 3.8  CL 110 109 107  CO2 29 27 29   BUN 65* 65* 63*  CREATININE 1.58* 1.58*  1.64*  GLUCOSE 103* 136* 114*   Electrolytes  Recent Labs Lab 12/24/13 0230  12/25/13 0445  12/26/13 0700 12/26/13 1519 12/26/13 2115 12/27/13 0200  CALCIUM 6.7*  < >  --   < >  --  7.3* 6.8* 7.3*  MG 2.3  --  2.4  --  2.1  --   --   --   PHOS 1.7*  --  1.6*  --  2.7  --   --   --   < > = values in this interval not displayed. Sepsis Markers  Recent Labs Lab 12/21/13 2359 12/22/13 1200  LATICACIDVEN  --  1.8  PROCALCITON 0.20  --    ABG  Recent Labs Lab 12/25/13 0418 12/26/13 0330 12/26/13 2110  PHART 7.491* 7.427 7.436  PCO2ART 32.8* 41.4 40.3  PO2ART 61.7* 60.8* 117.0*   Liver Enzymes  Recent Labs Lab  12/21/13 2138  AST 31  ALT 28  ALKPHOS 51  BILITOT 1.0  ALBUMIN 2.6*   Cardiac Enzymes  Recent Labs Lab 12/21/13 2138 12/22/13 0416 12/22/13 0425 12/22/13 1040  TROPONINI <0.30 <0.30  --  0.42*  PROBNP  --   --  418.4  --    Glucose  Recent Labs Lab 12/26/13 0732 12/26/13 1145 12/26/13 1524 12/26/13 2004 12/27/13 0011 12/27/13 0401  GLUCAP 135* 133* 89 148* 98 95    Imaging Dg Chest Port 1 View  12/26/2013   CLINICAL DATA:  Pneumonia, intubated  EXAM: PORTABLE CHEST - 1 VIEW  COMPARISON:  12/25/2013  FINDINGS: Endotracheal tube 5.8 cm above the carina. Right IJ central line tip mid SVC level. NG tube extends into the stomach. Stable cardiomegaly with diffuse interstitial prominence versus mild edema. Bibasilar opacities persist, basilar pneumonia not excluded. No enlarging effusion or pneumothorax. Atherosclerosis of the aorta.  IMPRESSION: Stable cardiomegaly with mild interstitial edema pattern  Asymmetric basilar opacities persist, worse in the left lower lobe, could represent superimposed basilar pneumonia.   Electronically Signed   By: Daryll Brod M.D.   On: 12/26/2013 08:15   Dg Chest Port 1 View  12/25/2013   CLINICAL DATA:  Intubated.  EXAM: PORTABLE CHEST - 1 VIEW  COMPARISON:  12/24/2013  FINDINGS: The patient is rotated to the right. Endotracheal tube is unchanged in position with tip at the inferior margin of the clavicular heads. Enteric tube courses into the upper abdomen with tip not imaged. Right jugular central venous catheter is unchanged with tip overlying the mid to lower SVC. Cardiomediastinal silhouette is unchanged. Diffuse interstitial opacities do not appear significantly changed. More focal infiltrate in the left base is also unchanged. No large pleural effusion is identified.  IMPRESSION: Unchanged appearance of support lines and tubes. Unchanged bilateral interstitial opacities, which could reflect mild edema, with more focal infiltrate in the left  base.   Electronically Signed   By: Logan Bores   On: 12/25/2013 08:01     CXR: slightly worsened interstitial infiltrate  ASSESSMENT / PLAN:  PULMONARY A: Acute resp failure Pseudomonas PNA ARDS Edema component P:   O2 supplemental to keep sats >90% Monitor respiratory status F/u pcxr abg if respiratory status changes Antibiotics per above Would avoid NIMV with poor airway protection skills Even balance goals May require re intubation  CARDIOVASCULAR A: Hypovolemia, shock, septic No REL AI Mild at best stress ischemia Echo 30-35%, akinesis of distal anteroseptal myocardium, grade 1 diastolic dysfunction, PA pressure mildly-moderately increased Vt , r./o electrolytes related, r/o ischemia P:  Pressors wean  today Hold lasix in setting of hypotension overnight, consider restarting for volume overload once pressures more stable Goal MAP 55, EF 35% noted Ecg, trop Replace K   RENAL A:   Acute renal failure - stable Hypophosphatemia hypernatremia Mild hypokalemia  overloaded P:   Hold antihypertensives  strick I&O -2.4 L yesterday BMET in am Holding lasix k supp Add d5w at 50 cc/hr   GASTROINTESTINAL A:  NPO FOBT +, no gross blood P:    SLP for swallow eval prior to giving a diet - if passes will give diet and free water Protonix   HEMATOLOGIC A: Anemia - likely dilutional - stable, progressive drop plat with pos balance Dilutional thrombocytopenia now with some hemoconcetration P:  Trend CBC  SCDs with + FOBT -outpt work up PPI BID there were concerns for GI bleeding, not noted, change to qday  INFECTIOUS A:  aspiration, decub / arm wound Procalcitonin 0.20 Pseudomonal PNA P:   Wound care cipro to stop date pcxr in am   ENDOCRINE A:  H/O DM  TSH 0.816 - WNL No relative AI P:   SSI Lantus 25, npo  = hold Improved cbgs  NEUROLOGIC A:  acute encephalopathy, agitation Delirium  P:   Off fentanyl Off precedex Frequent behavior  modification To chair  DERM A: sacral pressure ulcer: unstageable      Pressure ulcer right arm: unstageable  P: WOC consult appreciated  Tommi Rumps, MD  [place] PGY-2  TODAY'S SUMMARY: wean off pressors, swallow eval, hold lasix  I have personally obtained a history, examined the patient, evaluated laboratory and imaging results, formulated the assessment and plan and placed orders.  CRITICAL CARE: The patient is critically ill with multiple organ systems failure and requires high complexity decision making for assessment and support, frequent evaluation and titration of therapies, application of advanced monitoring technologies and extensive interpretation of multiple databases. Critical Care Time devoted to patient care services described in this note is 30 minutes.   Lavon Paganini. Titus Mould, MD, [place] Pgr: [facility]

## 2013-12-28 ENCOUNTER — Inpatient Hospital Stay (HOSPITAL_COMMUNITY): Payer: Medicare Other

## 2013-12-28 LAB — CULTURE, BLOOD (ROUTINE X 2): Culture: NO GROWTH

## 2013-12-28 LAB — BASIC METABOLIC PANEL WITH GFR
BUN: 45 mg/dL — ABNORMAL HIGH (ref 6–23)
CO2: 29 meq/L (ref 19–32)
Calcium: 7.3 mg/dL — ABNORMAL LOW (ref 8.4–10.5)
Chloride: 109 meq/L (ref 96–112)
Creatinine, Ser: 1.37 mg/dL — ABNORMAL HIGH (ref 0.50–1.35)
GFR calc Af Amer: 55 mL/min — ABNORMAL LOW
GFR calc non Af Amer: 47 mL/min — ABNORMAL LOW
Glucose, Bld: 173 mg/dL — ABNORMAL HIGH (ref 70–99)
Potassium: 4 meq/L (ref 3.7–5.3)
Sodium: 148 meq/L — ABNORMAL HIGH (ref 137–147)

## 2013-12-28 LAB — GLUCOSE, CAPILLARY
GLUCOSE-CAPILLARY: 170 mg/dL — AB (ref 70–99)
GLUCOSE-CAPILLARY: 108 mg/dL — AB (ref 70–99)
Glucose-Capillary: 121 mg/dL — ABNORMAL HIGH (ref 70–99)
Glucose-Capillary: 135 mg/dL — ABNORMAL HIGH (ref 70–99)
Glucose-Capillary: 139 mg/dL — ABNORMAL HIGH (ref 70–99)
Glucose-Capillary: 143 mg/dL — ABNORMAL HIGH (ref 70–99)

## 2013-12-28 LAB — POCT I-STAT 3, ART BLOOD GAS (G3+)
ACID-BASE EXCESS: 18 mmol/L — AB (ref 0.0–2.0)
Bicarbonate: 44.2 mEq/L — ABNORMAL HIGH (ref 20.0–24.0)
O2 SAT: 94 %
PCO2 ART: 58.9 mmHg — AB (ref 35.0–45.0)
Patient temperature: 98.3
TCO2: 46 mmol/L (ref 0–100)
pH, Arterial: 7.483 — ABNORMAL HIGH (ref 7.350–7.450)
pO2, Arterial: 68 mmHg — ABNORMAL LOW (ref 80.0–100.0)

## 2013-12-28 LAB — BASIC METABOLIC PANEL
BUN: 40 mg/dL — ABNORMAL HIGH (ref 6–23)
CO2: 30 mEq/L (ref 19–32)
Calcium: 7.7 mg/dL — ABNORMAL LOW (ref 8.4–10.5)
Chloride: 107 mEq/L (ref 96–112)
Creatinine, Ser: 1.34 mg/dL (ref 0.50–1.35)
GFR calc non Af Amer: 48 mL/min — ABNORMAL LOW (ref >90)
GFR, EST AFRICAN AMERICAN: 56 mL/min — AB (ref >90)
GLUCOSE: 135 mg/dL — AB (ref 70–99)
POTASSIUM: 3.6 meq/L — AB (ref 3.7–5.3)
Sodium: 148 mEq/L — ABNORMAL HIGH (ref 137–147)

## 2013-12-28 LAB — CBC
HCT: 34.6 % — ABNORMAL LOW (ref 39.0–52.0)
Hemoglobin: 11.1 g/dL — ABNORMAL LOW (ref 13.0–17.0)
MCH: 29.1 pg (ref 26.0–34.0)
MCHC: 32.1 g/dL (ref 30.0–36.0)
MCV: 90.6 fL (ref 78.0–100.0)
Platelets: 130 K/uL — ABNORMAL LOW (ref 150–400)
RBC: 3.82 MIL/uL — ABNORMAL LOW (ref 4.22–5.81)
RDW: 16.2 % — ABNORMAL HIGH (ref 11.5–15.5)
WBC: 10.4 K/uL (ref 4.0–10.5)

## 2013-12-28 LAB — PHOSPHORUS: Phosphorus: 3.4 mg/dL (ref 2.3–4.6)

## 2013-12-28 LAB — MAGNESIUM: Magnesium: 2.3 mg/dL (ref 1.5–2.5)

## 2013-12-28 MED ORDER — PRO-STAT SUGAR FREE PO LIQD
30.0000 mL | Freq: Every day | ORAL | Status: DC
  Filled 2013-12-28 (×2): qty 30

## 2013-12-28 MED ORDER — POTASSIUM CHLORIDE 10 MEQ/50ML IV SOLN
10.0000 meq | INTRAVENOUS | Status: AC
  Administered 2013-12-28 – 2013-12-29 (×2): 10 meq via INTRAVENOUS
  Filled 2013-12-28 (×2): qty 50

## 2013-12-28 MED ORDER — FUROSEMIDE 10 MG/ML IJ SOLN
40.0000 mg | Freq: Two times a day (BID) | INTRAMUSCULAR | Status: AC
  Administered 2013-12-28 – 2013-12-29 (×3): 40 mg via INTRAVENOUS
  Filled 2013-12-28 (×3): qty 4

## 2013-12-28 MED ORDER — GLUCERNA 1.2 CAL PO LIQD
1000.0000 mL | ORAL | Status: DC
  Filled 2013-12-28 (×4): qty 1000

## 2013-12-28 NOTE — Plan of Care (Signed)
Problem: SLP Dysphagia Goals Goal: Patient will demonstrate readiness for PO's Patient will demonstrate readiness for PO's and/or instrumental swallow study as evidenced by: As evidenced by ability to maintain adequate O2 levels with min assist.

## 2013-12-28 NOTE — Progress Notes (Signed)
Attempted to place an NG tube. Pt. Started to cough and mobilized thick green hunks of secretions. He was unable to stop coughing therefore the ng tube continuously passed into the patients lung. As he started to desaturate, I stopped the attempt to give him time to rest and recuperate Shane Pope will attempt to place later in hopes the patients cough/gag reflex is less sensitive.

## 2013-12-28 NOTE — Progress Notes (Signed)
Physical Therapy Wound Treatment Patient Details  Name: Shane Pope MRN: [id number] Date of Birth: [birth year]  Today's Date: 12/28/2013 Time: 4098-1191 Time Calculation (min): 27 min  Subjective  Subjective: Pt talking but difficult to understand due to mumbling and ventimask.  Pain Score:  Generalized pain in legs. Relieved with positioning.  Wound Assessment  Pressure Ulcer 12/22/13 Unstageable - Full thickness tissue loss in which the base of the ulcer is covered by slough (yellow, tan, gray, green or brown) and/or eschar (tan, brown or black) in the wound bed. red (Active)  Dressing Type Moist to dry; santyl;Foam;Other (Comment) 12/28/2013 10:40 AM  Dressing Changed 12/28/2013 10:40 AM  Dressing Change Frequency Daily 12/28/2013 10:40 AM  State of Healing Eschar 12/28/2013 10:40 AM  Site / Wound Assessment Black;Red 12/28/2013 10:40 AM  % Wound base Red or Granulating 60% 12/28/2013 10:49 AM  % Wound base Yellow 5% 12/28/2013 10:49 AM  % Wound base Black 35% 12/28/2013 10:49 AM  % Wound base Other (Comment) 0% 12/28/2013 10:40 AM  Peri-wound Assessment Purple 12/28/2013 10:40 AM  Wound Length (cm) 5.5 cm 12/28/2013 10:40 AM  Wound Width (cm) 9.5 cm 12/28/2013 10:40 AM  Wound Depth (cm) 0 cm 12/28/2013 10:40 AM  Margins Unattached edges (unapproximated) 12/28/2013 10:40 AM  Drainage Amount Minimal 12/28/2013 10:40 AM  Drainage Description Serosanguineous 12/28/2013 10:40 AM  Treatment Debridement (Selective);Hydrotherapy (Pulse lavage);Packing (Saline gauze) 12/28/2013 10:40 AM   Hydrotherapy Pulsed lavage therapy - wound location: sacrum Pulsed Lavage with Suction (psi): 12 psi (8-12) Pulsed Lavage with Suction - Normal Saline Used: 1000 mL Pulsed Lavage Tip: Tip with splash shield Selective Debridement Selective Debridement - Location: sacrum Selective Debridement - Tools Used: Forceps;Scalpel;Scissors Selective Debridement - Tissue Removed: black eschar, scored eschar    Wound  Assessment and Plan  Wound Therapy - Assess/Plan/Recommendations Wound Therapy - Clinical Statement: Eschar of wound beginning to loosen at edges.  Hydrotherapy Plan: Debridement;Dressing change;Patient/family education;Pulsatile lavage with suction Wound Therapy - Frequency: 3X / week Wound Therapy - Follow Up Recommendations: Skilled nursing facility Wound Plan: See above  Wound Therapy Goals- Improve the function of patient's integumentary system by progressing the wound(s) through the phases of wound healing (inflammation - proliferation - remodeling) by: Decrease Necrotic Tissue to: 25 Decrease Necrotic Tissue - Progress: Goal set today Increase Granulation Tissue to: 75 Increase Granulation Tissue - Progress: Goal set today Goals/treatment plan/discharge plan were made with and agreed upon by patient/family: Yes Time For Goal Achievement: 7 days Wound Therapy - Potential for Goals: Good  Goals will be updated until maximal potential achieved or discharge criteria met.  Discharge criteria: when goals achieved, discharge from hospital, MD decision/surgical intervention, no progress towards goals, refusal/missing three consecutive treatments without notification or medical reason.  GP     Analaya Hoey 12/28/2013, 10:49 AM  Suanne Marker PT [phone]

## 2013-12-28 NOTE — Progress Notes (Signed)
Name: Shane Pope MRN: [id number] DOB: 1931-03-06    ADMISSION DATE:  12/21/2013 CONSULTATION DATE:  2/16  REFERRING MD :  Shane Pope medical  PRIMARY SERVICE: PCCM   CHIEF COMPLAINT:  Acute renal failure, resp failure  BRIEF PATIENT DESCRIPTION:  80 year old male found on floor by family. Confused, unable to get up. In ER found to be hypotensive and in acute renal failure w/ profound metabolic derangements. PCCM asked to admit   SIGNIFICANT EVENTS / STUDIES:  Lumbar spine 2/16: no acute fx Thoracic spine 2/16: no acute fx, old L1 compression fx w/ vertebroplasty  CT head 2/16: negative  Pelvis 2/16: neg  Renal US 2/17: no hydro or obstruction 2/17 - high pressor needs, acidotic 2/18 - Echo 30-35%, akinesis of distal anteroseptal myocardium, grade 1 diastolic dysfunction, PA pressure mildly-moderately increased 2/19- pressors improved 2/20 - pressors continue to improve 2/21 - off pressors 2/21 - self extubated, BP low with lasix and back on levophed, neg 2.8 liters 2/22 - continues to do well off vent 2/23 - rising pCO2 off vent  LINES / TUBES: 2/16 rt ij>>> 2/16 ett>>>2/21 2/17 rt rad>>>  CULTURES: BCX 2 2/16>>> NGTD UC 2/16>>> neg Resp Cx>>>abundant pseudomonas, pan sensitive  ANTIBIOTICS: 2/16 unasyn>>>2/18 2/18 zosyn>>>2/20 2/20 cipro>>>plan 14 days 2/18 vanc>>>2/19 2/16 ceftriaxone>>>2/17  SUBJECTIVE: did well overnight. Remains confused. Slight high WOB Afebrile UO ok   VITAL SIGNS: Temp:  [97.4 F (36.3 C)-99.2 F (37.3 C)] 99.2 F (37.3 C) (02/23 0442) Pulse Rate:  [65-106] 75 (02/23 0600) Resp:  [15-36] 34 (02/23 0600) BP: (95-136)/(33-69) 116/44 mmHg (02/23 0600) SpO2:  [92 %-100 %] 98 % (02/23 0600) FiO2 (%):  [40 %] 40 % (02/22 1800) Weight:  [184 lb 15.5 oz (83.9 kg)] 184 lb 15.5 oz (83.9 kg) (02/23 0443) HEMODYNAMICS:   VENTILATOR SETTINGS: Vent Mode:  [-]  FiO2 (%):  [40 %] 40 % INTAKE / OUTPUT: Intake/Output     02/22 0701 -  02/23 0700   I.V. (mL/kg) 1642.2 (19.6)   Other 200   IV Piggyback 800   Total Intake(mL/kg) 2642.2 (31.5)   Urine (mL/kg/hr) 1565 (0.8)   Stool 150 (0.1)   Total Output 1715   Net +927.2         PHYSICAL EXAMINATION: General:  Weak, frail elderly male Neuro:  Alert, oriented to person only HEENT:  NRB on CV: rrr, no murmur appreciated Lungs:  CTAB Abdomen:  Non-tender + bowel sounds Musculoskeletal: low muscle mass  Skin:  Bilateral toes less purple, toes warm, skin on left arm covered with pink pad  LABS:  CBC  Recent Labs Lab 12/26/13 0425 12/27/13 0625 12/28/13 0400  WBC 9.1 9.3 10.4  HGB 11.2* 11.6* 11.1*  HCT 35.4* 36.0* 34.6*  PLT 119* 128* 130*   Coag's No results found for this basename: APTT, INR,  in the last 168 hours BMET  Recent Labs Lab 12/27/13 0600 12/27/13 1540 12/28/13 0200  NA 150* 150* 148*  K 3.8 4.1 4.0  CL 108 108 109  CO2 30 29 29   BUN 62* 55* 45*  CREATININE 1.66* 1.48* 1.37*  GLUCOSE 105* 134* 173*   Electrolytes  Recent Labs Lab 12/25/13 0445  12/26/13 0700  12/27/13 0600 12/27/13 1540 12/28/13 0200  CALCIUM  --   < >  --   < > 7.6* 7.4* 7.3*  MG 2.4  --  2.1  --  2.0  --   --   PHOS 1.6*  --  2.7  --  3.6  --   --   < > = values in this interval not displayed. Sepsis Markers  Recent Labs Lab 12/21/13 2359 12/22/13 1200  LATICACIDVEN  --  1.8  PROCALCITON 0.20  --    ABG  Recent Labs Lab 12/26/13 0330 12/26/13 2110 12/28/13 0516  PHART 7.427 7.436 7.483*  PCO2ART 41.4 40.3 58.9*  PO2ART 60.8* 117.0* 68.0*   Liver Enzymes  Recent Labs Lab 12/21/13 2138  AST 31  ALT 28  ALKPHOS 51  BILITOT 1.0  ALBUMIN 2.6*   Cardiac Enzymes  Recent Labs Lab 12/22/13 0416 12/22/13 0425  12/27/13 0815 12/27/13 1540 12/27/13 2015  TROPONINI <0.30  --   < > <0.30 <0.30 <0.30  PROBNP  --  418.4  --   --   --   --   < > = values in this interval not displayed. Glucose  Recent Labs Lab 12/27/13 0829  12/27/13 1139 12/27/13 1618 12/27/13 2000 12/28/13 0018 12/28/13 0419  GLUCAP 100* 122* 110* 144* 143* 170*    Imaging Dg Chest Port 1 View  12/28/2013   CLINICAL DATA:  80 year old male shortness of breath. Renal failure, respiratory failure. Initial encounter.  EXAM: PORTABLE CHEST - 1 VIEW  COMPARISON:  12/27/2013 and earlier.  FINDINGS: Portable AP semi upright view at 0407 hr. Stable right IJ central line. Stable cardiomegaly and mediastinal contours. Continued bilateral veiling pulmonary opacity in confluent retrocardiac opacity. [name] regression of pulmonary vascular congestion. No pneumothorax.  IMPRESSION: Mildly regressed pulmonary edema. Bilateral pleural effusions and bilateral lower lobe collapse/ consolidation.   Electronically Signed   By: Lars Pinks M.D.   On: 12/28/2013 07:32   Dg Chest Port 1 View  12/27/2013   CLINICAL DATA:  Volume overload  EXAM: PORTABLE CHEST - 1 VIEW  COMPARISON:  12/26/2013  FINDINGS: Interval extubation. NG tube removed. Right IJ central line remains in the upper SVC. Cardiomegaly persist with slight worsening diffuse interstitial edema pattern throughout both lungs. Basilar atelectasis persist with dense left base consolidation/ collapse. Small effusions not excluded. No pneumothorax. Healed posterior right rib fractures present.  IMPRESSION: Cardiomegaly with slight worsening interstitial edema pattern.   Electronically Signed   By: Daryll Brod M.D.   On: 12/27/2013 07:45     CXR: improved interstitial infiltrate, bilateral pleural effusions  ASSESSMENT / PLAN:  PULMONARY A: Acute resp failure - improved Pseudomonas PNA ARDS Edema component P:   O2 supplemental to keep sats >90% Monitor respiratory status pcxr tomorrow am abg if respiratory status changes Antibiotics per above Can use NIMV -good cough Even balance goals May require re intubation if resp status changes - will need discussion with family regarding this  CARDIOVASCULAR A:  Hypovolemia, shock, septic - improved No REL AI Mild at best stress ischemia Echo 30-35%, akinesis of distal anteroseptal myocardium, grade 1 diastolic dysfunction, PA pressure mildly-moderately increased P:  Pressors off Will dose lasix again today - will follow volume status Need cards input based on echo findings - will likely involve tomorrow, consider addition hydralazine at that time  RENAL A:   Acute renal failure - improving hypernatremia  Volume overload P:   Hold antihypertensives  strict I&O BMET BID, mag and phos daily d5w at 40 cc/hr   GASTROINTESTINAL A:  NPO FOBT +, no gross blood P:    SLP for swallow eval - will continue to follow as failed today Protonix Place NG tube - will start tube feeds once this is  placed   HEMATOLOGIC A: Anemia - likely dilutional - stable P:  Trend CBC  SCDs with + FOBT -outpt work up PPI  INFECTIOUS A:  aspiration, decub / arm wound Procalcitonin 0.20 Pseudomonal PNA P:   Wound care cipro to stop date - 14 day course pcxr in am   ENDOCRINE A:  H/O DM  TSH 0.816 - WNL No relative AI P:   SSI lantus on hold until taking PO cbgs  NEUROLOGIC A:  acute encephalopathy, agitation Delirium  P:   Hold sedation Frequent behavior modification To chair PT to see pt  DERM A: sacral pressure ulcer: unstageable      Pressure ulcer right arm: unstageable  P: WOC consult appreciated  Tommi Rumps, MD  [facility] PGY-2  TODAY'S SUMMARY: swallow eval failed, lasix, place NG tube and start tube feeds, decrease D5W Not thriving post extubation, expect slow progress , high risk for decompensation  Kara Mead MD. FCCP. [facility] Pager [phone] If no response call [phone]

## 2013-12-28 NOTE — Progress Notes (Signed)
NUTRITION FOLLOW UP  DOCUMENTATION CODES  Per approved criteria   -Non-severe (moderate) malnutrition in the context of chronic illness    Intervention:    Once NGT placed, utilize 11M PEPuP Protocol: initiate TF via NGT with Glucerna 1.2 at 25 ml/h and Prostat 30 ml once daily on day 1; on day 2, increase to goal rate of 70 ml/h (1680 ml per day) to provide 2116 kcals, 116 gm protein, 1352 ml free water daily.  Nutrition Dx:   Inadequate oral intake related to inability to eat as evidenced by NPO status. Ongoing.  Goal:   Intake to meet >90% of estimated nutrition needs.  Monitor:   PO intake, labs, weight trend.  Assessment:   80 year old male found on floor by family. Confused, unable to get up. In ER found to be hypotensive and in acute renal failure w/ profound metabolic derangements. Hypothermic on arrival 91 degrees. Patient intubated on 2/17.  Patient self-extubated on 2/21. Discussed patient in ICU rounds today. Patient has been NPO since extubation on 2/21. Failed bedside swallow evaluation with SLP this morning. Plans to place NGT for feeding today. Weight trending up with positive fluid status.  Height: Ht Readings from Last 1 Encounters:  12/22/13 6' 0.83" (1.85 m)    Weight Status:  Increasing with positive fluid status. Wt Readings from Last 1 Encounters:  12/28/13 184 lb 15.5 oz (83.9 kg)  12/22/13  164 lb 14.5 oz (74.8 kg)  Re-estimated needs:  Kcal: 2000-2200 Protein: 115-125 gm Fluid: 2-2.2 L  Skin: 2 unstageable sacral pressure ulcers, deep tissue injury pressure ulcer on left elbow  Diet Order: NPO   Intake/Output Summary (Last 24 hours) at 12/28/13 1245 Last data filed at 12/28/13 1100  Gross per 24 hour  Intake 2241.38 ml  Output   1240 ml  Net 1001.38 ml    Last BM: 2/22   Labs:   Recent Labs Lab 12/26/13 0700  12/27/13 0600 12/27/13 1540 12/28/13 0200 12/28/13 0700  NA  --   < > 150* 150* 148*  --   K  --   < > 3.8 4.1 4.0  --    CL  --   < > 108 108 109  --   CO2  --   < > 30 29 29   --   BUN  --   < > 62* 55* 45*  --   CREATININE  --   < > 1.66* 1.48* 1.37*  --   CALCIUM  --   < > 7.6* 7.4* 7.3*  --   MG 2.1  --  2.0  --   --  2.3  PHOS 2.7  --  3.6  --   --  3.4  GLUCOSE  --   < > 105* 134* 173*  --   < > = values in this interval not displayed.  CBG (last 3)   Recent Labs  12/28/13 0419 12/28/13 0741 12/28/13 1123  GLUCAP 170* 135* 121*    Scheduled Meds: . antiseptic oral rinse  15 mL Mouth Rinse QID  . chlorhexidine  15 mL Mouth Rinse BID  . ciprofloxacin  400 mg Intravenous Q12H  . collagenase   Topical Daily  . furosemide  40 mg Intravenous Q12H  . insulin aspart  0-15 Units Subcutaneous 6 times per day  . pantoprazole sodium  40 mg Per Tube Daily    Continuous Infusions: . sodium chloride 20 mL/hr (12/26/13 1618)  . dextrose 40 mL (12/28/13  1126)  . feeding supplement (VITAL AF 1.2 CAL) Stopped (12/26/13 2200)    Molli Barrows, RD, LDN, [place] Pager [phone] After Hours Pager [phone]

## 2013-12-28 NOTE — Evaluation (Signed)
Clinical/Bedside Swallow Evaluation Patient Details  Name: Shane Pope MRN: [id number] Date of Birth: 18-Feb-1931  Today's Date: 12/28/2013 Time: 0940-1000 SLP Time Calculation (min): 20 min  Past Medical History:  Past Medical History  Diagnosis Date  . Hypertension   . Diabetes mellitus   . Anemia   . Blood transfusion     hx of   . GERD (gastroesophageal reflux disease)   . Arthritis   . 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 Fracture     hx of right wrist fracture   . Fracture, ribs     hx of on right side   . Pneumothorax on right   . PVD (peripheral vascular disease)   . Hyperlipidemia    Past Surgical History:  Past Surgical History  Procedure Laterality Date  . Hernia repair      right inguinal hernia repair   . Other surgical history      undescended testicle surgery  . Cholecystectomy    . Hemorrhoid surgery    . Eye surgery      bilateral cataract surgery  . Knee arthroscopy      right knee   . Appendectomy    . Total knee arthroplasty  03/24/2012    Procedure: TOTAL KNEE ARTHROPLASTY;  Surgeon: Gearlean Alf, MD;  Location: [facility];  Service: Orthopedics;  Laterality: Right;   HPI:  80 year old male with PMH of GERD, COPD, anemia, living independently, found on floor by family 2/16. Confused, unable to get up. In ER found to be hypotensive and in acute renal failure w/ profound metabolic derangements. Intubated 2/16, self extubated 2/21.    Assessment / Plan / Recommendation Clinical Impression  Bedside swallow evaluation complete. Patient with significant risk factors for aspiration including altered mentation, decreased oral management of secretions, and poor respiratory status with inability to maintain adequate O2 levels with nasal cannula in place. SLP cueing for hard cough in attempts to move secretions ineffective at improving O2 saturation levels. Minimal pos trials provided with intact ability to initiate swallow however risk of aspiration  remains high.  Prognosis for ability to resume pos good with time off vent and improved respiratory status. SLP will continue to f/u at bedside.     Aspiration Risk  Severe    Diet Recommendation NPO   Medication Administration: Via alternative means    Other  Recommendations Oral Care Recommendations: Oral care Q4 per protocol (secretions coating tongue and hard palate)   Follow Up Recommendations       Frequency and Duration min 2x/week  2 weeks   Pertinent Vitals/Pain n/a     Swallow Study    General HPI: 80 year old male with PMH of GERD, COPD, anemia, living independently, found on floor by family 2/16. Confused, unable to get up. In ER found to be hypotensive and in acute renal failure w/ profound metabolic derangements. Intubated 2/16, self extubated 2/21.  Type of Study: Bedside swallow evaluation Previous Swallow Assessment: none Diet Prior to this Study: NPO Temperature Spikes Noted: No Respiratory Status: Nasal cannula History of Recent Intubation: Yes Length of Intubations (days): 5 days Date extubated: 12/26/13 Behavior/Cognition: Alert;Cooperative;Pleasant mood;Confused Oral Cavity - Dentition: Poor condition Self-Feeding Abilities: Needs assist Patient Positioning: Upright in bed Baseline Vocal Quality: Hoarse;Low vocal intensity Volitional Cough: Weak Volitional Swallow: Able to elicit    Oral/Motor/Sensory Function Overall Oral Motor/Sensory Function: Appears within functional limits for tasks assessed   Ice Chips  Ice chips: Impaired Presentation: Spoon Oral Phase Impairments: Reduced lingual movement/coordination;Impaired anterior to posterior transit   Thin Liquid Thin Liquid: Not tested    Nectar Thick Nectar Thick Liquid: Not tested   Honey Thick Honey Thick Liquid: Not tested   Puree Puree: Not tested   Solid   GO   Shane Campanelli MA, CCC-SLP [phone]  Solid: Not tested       Shane Pope 12/28/2013,12:05 PM

## 2013-12-29 ENCOUNTER — Encounter (HOSPITAL_COMMUNITY): Payer: Self-pay | Admitting: Physician Assistant

## 2013-12-29 ENCOUNTER — Inpatient Hospital Stay (HOSPITAL_COMMUNITY): Payer: Medicare Other

## 2013-12-29 DIAGNOSIS — J449 Chronic obstructive pulmonary disease, unspecified: Secondary | ICD-10-CM

## 2013-12-29 DIAGNOSIS — I509 Heart failure, unspecified: Secondary | ICD-10-CM

## 2013-12-29 LAB — GLUCOSE, CAPILLARY
GLUCOSE-CAPILLARY: 111 mg/dL — AB (ref 70–99)
GLUCOSE-CAPILLARY: 116 mg/dL — AB (ref 70–99)
GLUCOSE-CAPILLARY: 136 mg/dL — AB (ref 70–99)
Glucose-Capillary: 103 mg/dL — ABNORMAL HIGH (ref 70–99)
Glucose-Capillary: 100 mg/dL — ABNORMAL HIGH (ref 70–99)
Glucose-Capillary: 98 mg/dL (ref 70–99)

## 2013-12-29 LAB — MAGNESIUM
Magnesium: 2 mg/dL (ref 1.5–2.5)
Magnesium: 2.1 mg/dL (ref 1.5–2.5)

## 2013-12-29 LAB — BASIC METABOLIC PANEL
BUN: 39 mg/dL — ABNORMAL HIGH (ref 6–23)
BUN: 38 mg/dL — ABNORMAL HIGH (ref 6–23)
CHLORIDE: 102 meq/L (ref 96–112)
CO2: 30 mEq/L (ref 19–32)
CO2: 31 mEq/L (ref 19–32)
Calcium: 7.5 mg/dL — ABNORMAL LOW (ref 8.4–10.5)
Calcium: 8.1 mg/dL — ABNORMAL LOW (ref 8.4–10.5)
Chloride: 104 mEq/L (ref 96–112)
Creatinine, Ser: 1.4 mg/dL — ABNORMAL HIGH (ref 0.50–1.35)
Creatinine, Ser: 1.35 mg/dL (ref 0.50–1.35)
GFR calc Af Amer: 53 mL/min — ABNORMAL LOW (ref >90)
GFR calc Af Amer: 56 mL/min — ABNORMAL LOW (ref >90)
GFR calc non Af Amer: 46 mL/min — ABNORMAL LOW (ref >90)
GFR calc non Af Amer: 48 mL/min — ABNORMAL LOW (ref >90)
Glucose, Bld: 108 mg/dL — ABNORMAL HIGH (ref 70–99)
Glucose, Bld: 109 mg/dL — ABNORMAL HIGH (ref 70–99)
POTASSIUM: 3.5 meq/L — AB (ref 3.7–5.3)
Potassium: 3.9 mEq/L (ref 3.7–5.3)
Sodium: 146 mEq/L (ref 137–147)
Sodium: 146 mEq/L (ref 137–147)

## 2013-12-29 LAB — CBC
HCT: 34.7 % — ABNORMAL LOW (ref 39.0–52.0)
Hemoglobin: 11.3 g/dL — ABNORMAL LOW (ref 13.0–17.0)
MCH: 29.4 pg (ref 26.0–34.0)
MCHC: 32.6 g/dL (ref 30.0–36.0)
MCV: 90.1 fL (ref 78.0–100.0)
Platelets: 157 10*3/uL (ref 150–400)
RBC: 3.85 MIL/uL — ABNORMAL LOW (ref 4.22–5.81)
RDW: 16.4 % — ABNORMAL HIGH (ref 11.5–15.5)
WBC: 7.7 10*3/uL (ref 4.0–10.5)

## 2013-12-29 LAB — PHOSPHORUS
Phosphorus: 4.4 mg/dL (ref 2.3–4.6)
Phosphorus: 4.4 mg/dL (ref 2.3–4.6)

## 2013-12-29 MED ORDER — FUROSEMIDE 10 MG/ML IJ SOLN
40.0000 mg | Freq: Two times a day (BID) | INTRAMUSCULAR | Status: AC
  Administered 2013-12-29 – 2013-12-30 (×3): 40 mg via INTRAVENOUS
  Filled 2013-12-29 (×3): qty 4

## 2013-12-29 MED ORDER — VITAL HIGH PROTEIN PO LIQD
1000.0000 mL | ORAL | Status: DC
  Administered 2013-12-29: 1000 mL
  Filled 2013-12-29 (×2): qty 1000

## 2013-12-29 MED ORDER — IOHEXOL 300 MG/ML  SOLN
50.0000 mL | Freq: Once | INTRAMUSCULAR | Status: AC | PRN
  Administered 2013-12-29: 50 mL via ORAL

## 2013-12-29 MED ORDER — PNEUMOCOCCAL 13-VAL CONJ VACC IM SUSP: 0.5000 mL | INTRAMUSCULAR | Status: DC

## 2013-12-29 MED ORDER — PRO-STAT SUGAR FREE PO LIQD
30.0000 mL | Freq: Two times a day (BID) | ORAL | Status: DC
  Administered 2013-12-29 – 2014-01-02 (×7): 30 mL
  Filled 2013-12-29 (×11): qty 30

## 2013-12-29 MED ORDER — INFLUENZA VAC SPLIT QUAD 0.5 ML IM SUSP
0.5000 mL | INTRAMUSCULAR | Status: AC
  Administered 2013-12-30: 0.5 mL via INTRAMUSCULAR
  Filled 2013-12-29: qty 0.5

## 2013-12-29 MED ORDER — PNEUMOCOCCAL VAC POLYVALENT 25 MCG/0.5ML IJ INJ
0.5000 mL | INJECTION | INTRAMUSCULAR | Status: AC
  Administered 2013-12-30: 0.5 mL via INTRAMUSCULAR
  Filled 2013-12-29: qty 0.5

## 2013-12-29 NOTE — Progress Notes (Addendum)
Name: Shane Pope MRN: [id number] DOB: Jul 05, 1931    ADMISSION DATE:  12/21/2013 CONSULTATION DATE:  2/16  REFERRING MD :  [facility]  PRIMARY SERVICE: PCCM   CHIEF COMPLAINT:  Acute renal failure, resp failure  BRIEF PATIENT DESCRIPTION:  80 year old male found on floor by family. Confused, unable to get up. In ER found to be hypotensive and in acute renal failure w/ profound metabolic derangements. PCCM asked to admit   SIGNIFICANT EVENTS / STUDIES:  Lumbar spine 2/16: no acute fx Thoracic spine 2/16: no acute fx, old L1 compression fx w/ vertebroplasty  CT head 2/16: negative  Pelvis 2/16: neg  Renal US 2/17: no hydro or obstruction 2/17 - high pressor needs, acidotic 2/18 - Echo 30-35%, akinesis of distal anteroseptal myocardium, grade 1 diastolic dysfunction, PA pressure mildly-moderately increased 2/19- pressors improved 2/20 - pressors continue to improve 2/21 - off pressors 2/21 - self extubated, BP low with lasix and back on levophed, neg 2.8 liters 2/22 - continues to do well off vent 2/23 - rising pCO2 off vent  LINES / TUBES: 2/16 rt ij>>> 2/16 ett>>>2/21 2/17 rt rad>>>2/21  CULTURES: BCX 2 2/16>>> NGTD UC 2/16>>> neg Resp Cx>>>abundant pseudomonas, pan sensitive  ANTIBIOTICS: 2/16 unasyn>>>2/18 2/18 zosyn>>>2/20 2/20 cipro>>>plan 14 days 2/18 vanc>>>2/19 2/16 ceftriaxone>>>2/17  SUBJECTIVE: did well overnight. Remains confused. UOP good yesterday with lasix. Failed NGT placement yesterday. afebrile  VITAL SIGNS: Temp:  [97.6 F (36.4 C)-99.6 F (37.6 C)] 97.6 F (36.4 C) (02/24 0354) Pulse Rate:  [71-95] 71 (02/24 0600) Resp:  [15-36] 33 (02/24 0600) BP: (109-144)/(41-101) 137/49 mmHg (02/24 0600) SpO2:  [88 %-96 %] 94 % (02/24 0600) FiO2 (%):  [55 %] 55 % (02/24 0600) Weight:  [80.1 kg (176 lb 9.4 oz)] 80.1 kg (176 lb 9.4 oz) (02/24 0353) HEMODYNAMICS:   VENTILATOR SETTINGS: Vent Mode:  [-]  FiO2 (%):  [55 %] 55 % INTAKE /  OUTPUT: Intake/Output     02/23 0701 - 02/24 0700   I.V. (mL/kg) 1010 (12.6)   Other 50   IV Piggyback 450   Total Intake(mL/kg) 1510 (18.9)   Urine (mL/kg/hr) 3925 (2)   Stool 100 (0.1)   Total Output 4025   Net -2515         PHYSICAL EXAMINATION: General:  Weak, frail elderly male Neuro:  Alert, oriented to person only HEENT:  50% venti mask in place CV: rrr, no murmur appreciated Lungs:  CTAB Abdomen:  Non-tender + bowel sounds Musculoskeletal: low muscle mass  Skin:  Bilateral toes with minimal purple discoloration, toes warm, skin on left arm covered with pink pad  LABS:  CBC  Recent Labs Lab 12/26/13 0425 12/27/13 0625 12/28/13 0400  WBC 9.1 9.3 10.4  HGB 11.2* 11.6* 11.1*  HCT 35.4* 36.0* 34.6*  PLT 119* 128* 130*   Coag's No results found for this basename: APTT, INR,  in the last 168 hours BMET  Recent Labs Lab 12/28/13 0200 12/28/13 1400 12/29/13 0402  NA 148* 148* 146  K 4.0 3.6* 3.9  CL 109 107 104  CO2 29 30 30   BUN 45* 40* 39*  CREATININE 1.37* 1.34 1.40*  GLUCOSE 173* 135* 108*   Electrolytes  Recent Labs Lab 12/26/13 0700  12/27/13 0600  12/28/13 0200 12/28/13 0700 12/28/13 1400 12/29/13 0402  CALCIUM  --   < > 7.6*  < > 7.3*  --  7.7* 7.5*  MG 2.1  --  2.0  --   --  2.3  --   --   PHOS 2.7  --  3.6  --   --  3.4  --   --   < > = values in this interval not displayed. Sepsis Markers  Recent Labs Lab 12/22/13 1200  LATICACIDVEN 1.8   ABG  Recent Labs Lab 12/26/13 0330 12/26/13 2110 12/28/13 0516  PHART 7.427 7.436 7.483*  PCO2ART 41.4 40.3 58.9*  PO2ART 60.8* 117.0* 68.0*   Liver Enzymes No results found for this basename: AST, ALT, ALKPHOS, BILITOT, ALBUMIN,  in the last 168 hours Cardiac Enzymes  Recent Labs Lab 12/27/13 0815 12/27/13 1540 12/27/13 2015  TROPONINI <0.30 <0.30 <0.30   Glucose  Recent Labs Lab 12/28/13 0741 12/28/13 1123 12/28/13 1527 12/28/13 1934 12/29/13 0027 12/29/13 0349   GLUCAP 135* 121* 139* 108* 136* 103*    Imaging Dg Chest Port 1 View  12/28/2013   CLINICAL DATA:  80 year old male shortness of breath. Renal failure, respiratory failure. Initial encounter.  EXAM: PORTABLE CHEST - 1 VIEW  COMPARISON:  12/27/2013 and earlier.  FINDINGS: Portable AP semi upright view at 0407 hr. Stable right IJ central line. Stable cardiomegaly and mediastinal contours. Continued bilateral veiling pulmonary opacity in confluent retrocardiac opacity. [name] regression of pulmonary vascular congestion. No pneumothorax.  IMPRESSION: Mildly regressed pulmonary edema. Bilateral pleural effusions and bilateral lower lobe collapse/ consolidation.   Electronically Signed   By: Lars Pinks M.D.   On: 12/28/2013 07:32     CXR: worsened interstitial infiltrate, bilateral pleural effusions  ASSESSMENT / PLAN:  PULMONARY A: Acute resp failure - improved Pseudomonas PNA ARDS Edema component P:   O2 supplemental to keep sats >90% Monitor respiratory status pcxr tomorrow am Antibiotics per above Can use NIMV - good cough Desire negative balance May require re intubation if resp status changes - will need discussion with family regarding this  CARDIOVASCULAR A: Hypovolemia, shock, septic - improved No REL AI Mild at best stress ischemia Echo 30-35%, akinesis of distal anteroseptal myocardium, grade 1 diastolic dysfunction, PA pressure mildly-moderately increased P:  Pressors off Will dose lasix again today - will follow volume status Need cards input based on echo findings - will involve today  RENAL A:   Acute renal failure - slight worsening with lasix hypernatremia - improved Volume overload P:   Hold antihypertensives - consider starting on hydralazine today strict I&O BMET BID, mag and phos daily d5w at 40 cc/hr   GASTROINTESTINAL A:  NPO FOBT +, no gross blood P:    SLP for swallow eval - will continue to follow as failed yesterday Protonix Fluoro guided panda  tube, then restart TFs  HEMATOLOGIC A: Anemia - likely dilutional - stable P:  Trend CBC  SCDs with + FOBT -outpt work up PPI  INFECTIOUS A:  aspiration, decub / arm wound Procalcitonin 0.20 Pseudomonal PNA P:   Wound care cipro to stop date - 14 day course pcxr in am   ENDOCRINE A:  H/O DM  TSH 0.816 - WNL No relative AI P:   SSI lantus on hold until taking PO cbgs  NEUROLOGIC A:  acute encephalopathy, agitation Delirium  P:   Hold sedation Frequent behavior modification To chair PT to see pt  DERM A: sacral pressure ulcer: unstageable      Pressure ulcer right arm: unstageable  P: WOC consult appreciated  Tommi Rumps, MD  [place] PGY-2  TODAY'S SUMMARY: fluoro guided panda placement, continue lasix, PT to see patient, remains  at high risk for decompensation Discussed with daughter Magda Paganini, clarified no re- intubation status  Kara Mead MD. Shade Flood. [facility] Pager [phone] If no response call [phone]

## 2013-12-29 NOTE — Consult Note (Signed)
Referring Physician:  Primary Physician: [facility], Dr. Brigitte Pulse Primary Cardiologist:  PR saw in 2011 Reason for Consultation: Abnl Echo  HPI: 2011: Syncope, brief, while sitting in bed. Seen by PR, carotid dopplers, echo and CL performed. Results below, no cardiac cause found.   12/21/2013: 80 year old male found on floor by family. Confused, unable to get up. In ER found to be hypotensive and in acute renal failure w/ profound metabolic derangements. PCCM admitted.   Pt treated for PNA, hypovolemic shock, ARF with metabolic acidosis  123456:  PEA arrest (<30 sec) secondary to airway occlusion, asp PNA. An hour later, pt became hypoxic and hypotensive, possible ST changes (underlying LBBB), levophed started. Troponin 0.42, lactic acid level 1.8. 2D Echo 02/18 with EF 30-35%, + WMA, grade 1 DD.  02/19: Resp Cx + pseudomonas. ABX focused.   02/21: pt pulled out ET tube. He had been started on Lasix at 60 mg q 8 hr, but was increased later to 120 mg q 8 hr.  Levophed d/c'd but restarted when pt became hypotensive.  02/22: 12 bt run NSVT noted Mg and K+ checked and supplemented PRN.   02/23: Levophed d/c'd  Cardiology asked to see. Shane Pope does not remember having any chest pain. His SOB is better today than yesterday. He is oriented to name and place. He answers questions appropriately as he is able, but does not remember details of his admission.   Review of Systems: All available information is in the HPI above.    Cardiac Review of Systems: {Y] = yes [ ]  = no  Chest Pain [    ]  Resting SOB [   ] Exertional SOB  [  ]  Orthopnea [  ]   Pedal Edema [   ]    Palpitations [  ] Syncope  [  ]   Presyncope [   ]  General Review of Systems: [Y] = yes [  ]=no Constitional: recent weight change [  ]; anorexia [  ]; fatigue [  ]; nausea [  ]; night sweats [  ]; fever [  ]; or chills [  ];                                                                                                                                           Dental: poor dentition[  ];   Eye : blurred vision [  ]; diplopia [   ]; vision changes [  ];  Amaurosis fugax[  ]; Resp: cough [  ];  wheezing[  ];  hemoptysis[  ]; shortness of breath[  ]; paroxysmal nocturnal dyspnea[  ]; dyspnea on exertion[  ]; or orthopnea[  ];  GI:  gallstones[  ], vomiting[  ];  dysphagia[  ]; melena[  ];  hematochezia [  ]; heartburn[  ];   Hx  of  Colonoscopy[  ]; GU: kidney stones [  ]; hematuria[  ];   dysuria [  ];  nocturia[  ];  history of     obstruction [  ];                 Skin: rash, swelling[  ];, hair loss[  ];  peripheral edema[  ];  or itching[  ]; Musculosketetal: myalgias[  ];  joint swelling[  ];  joint erythema[  ];  joint pain[  ];  back pain[  ];  Heme/Lymph: bruising[  ];  bleeding[  ];  anemia[  ];  Neuro: TIA[  ];  headaches[  ];  stroke[  ];  vertigo[  ];  seizures[  ];   paresthesias[  ];  difficulty walking[  ];  Psych:depression[  ]; anxiety[  ];  Endocrine: diabetes[  ];  thyroid dysfunction[  ];  Immunizations: Flu [  ]; Pneumococcal[  ];  Other:  Past Medical History  Diagnosis Date  . Hypertension   . Diabetes mellitus   . Anemia   . Blood transfusion     hx of   . GERD (gastroesophageal reflux disease)   . Arthritis   . 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 Fracture     hx of right wrist fracture   . Fracture, ribs     hx of on right side   . Pneumothorax on right   . PVD (peripheral vascular disease)     Bilateral ICA stenosis 40-59% 2011  . Hyperlipidemia   . LBBB (left bundle branch block)    Past Surgical History  Procedure Laterality Date  . Hernia repair      right inguinal hernia repair   . Other surgical history      undescended testicle surgery  . Cholecystectomy    . Hemorrhoid surgery    . Eye surgery      bilateral cataract surgery  . Knee arthroscopy      right knee   . Appendectomy    . Total knee arthroplasty  03/24/2012    Procedure: TOTAL KNEE  ARTHROPLASTY;  Surgeon: Gearlean Alf, MD;  Location: [facility];  Service: Orthopedics;  Laterality: Right;     Current Medications: . antiseptic oral rinse  15 mL Mouth Rinse QID  . chlorhexidine  15 mL Mouth Rinse BID  . ciprofloxacin  400 mg Intravenous Q12H  . collagenase   Topical Daily  . feeding supplement (PRO-STAT SUGAR FREE 64)  30 mL Oral Q1500  . furosemide  40 mg Intravenous Q12H  . [START ON 12/30/2013] influenza vac split quadrivalent PF  0.5 mL Intramuscular Tomorrow-1000  . insulin aspart  0-15 Units Subcutaneous 6 times per day  . pantoprazole sodium  40 mg Per Tube Daily  . [START ON 12/30/2013] pneumococcal 23 valent vaccine  0.5 mL Intramuscular Tomorrow-1000   Infusions:  . sodium chloride 20 mL/hr (12/26/13 1618)  . dextrose 40 mL/hr at 12/28/13 2327  . feeding supplement (GLUCERNA 1.2 CAL) Stopped (12/28/13 1445)    Prescriptions prior to admission  Medication Sig Dispense Refill  . furosemide (LASIX) 40 MG tablet Take 40 mg by mouth daily.        No Known Allergies  History   Social History  . Marital Status: Divorced    Spouse Name: N/A    Number of Children: N/A  . Years of Education: N/A   Occupational History  .  Retired    Social History Main Topics  . Smoking status: Current Every Day Smoker -- 60 years    Types: Cigars  . Smokeless tobacco: Never Used  . Alcohol Use: No  . Drug Use: No  . Sexual Activity:    Other Topics Concern  . Not on file   Social History Narrative   Pt lives alone, dtr nearby.    History reviewed. No other family history details available  Family Status  Relation Status Death Age  . Mother Deceased   . Father Deceased     PHYSICAL EXAM: Filed Vitals:   12/29/13 1500  BP: 120/48  Pulse: 79  Temp:   Resp: 30    Intake/Output Summary (Last 24 hours) at 12/29/13 1549 Last data filed at 12/29/13 1400  Gross per 24 hour  Intake   1580 ml  Output   3675 ml  Net  -2095 ml    General:   Frail-appearing elderly male.  Mild-mod respiratory difficulty HEENT: normal for age Neck: supple. Elevated JVD. Carotids 2+ bilat; no bruits noted but pt not able to hold his breath, resp noise present. No lymphadenopathy or thryomegaly appreciated. Cor: PMI nondisplaced. Regular rate & rhythm. No rubs, gallops or murmurs. Lungs: Bibasilar rales Abdomen: soft, nontender, nondistended. No hepatosplenomegaly. No bruits or masses. Good bowel sounds. Extremities: no cyanosis, clubbing, rash, no edema Neuro: alert & oriented x 2, cranial nerves grossly intact. moves all 4 extremities but has generalized weakness. Affect pleasant. Skin: decubitus ulcers present on sacrum, LUE - seen by WOC, dressed and not disturbed  ECG:  12/27/2013 SR, LBBB is old Rate 70  ECHO: 12/23/2013 Study Conclusions - Left ventricle: The cavity size was normal. Systolic function was moderately to severely reduced. The estimated ejection fraction was in the range of 30% to 35%. There is akinesis of the distalanteroseptal myocardium. Doppler parameters are consistent with abnormal left ventricular relaxation (grade 1 diastolic dysfunction). - Left atrium: The atrium was mildly dilated. - Right ventricle: The cavity size was mildly dilated. Wall thickness was normal. - Right atrium: The atrium was mildly dilated. - Pulmonary arteries: Systolic pressure was mildly to moderately increased. PA peak pressure: 19mm Hg (S). Impressions: - When compared to prior ECHO, EF is reduced.  ECHO: 04/17/2010 Study Conclusions - Left ventricle: The cavity size was normal. Systolic function was normal. The estimated ejection fraction was in the range of 55% to 60%. There is dyssynergy of the septal and anteroseptal myocardium due to conduction abnormality. Doppler parameters are consistent with abnormal left ventricular relaxation (grade 1 diastolic dysfunction). - Ventricular septum: Septal motion showed dyssynergy. These changes  are consistent with intraventricular conduction delay. - Mitral valve: Mild regurgitation. - Left atrium: The atrium was mildly to moderately dilated. - Right ventricle: The cavity size was normal. Wall thickness was mildly increased. Reader: D. Bensimhon  Adenosine CL 2011: Low risk study Quantitative Gated Spect Images  QGS EDV: 153 ml  QGS ESV: 64 ml  QGS EF 58% QGS cine images: Abnromal septal motion  Findings  Low risk nuclear study  Overall Impression  Exercise Capacity: Lexiscan  BP Response: Normal blood pressure response.  Clinical Symptoms: Light headed  ECG Impression: LBBB  Overall Impression: Septal thinnng consistant with BBB no ischemia  Carotid Dopplers: 03/27/2010 IMPRESSION:  1. Right internal carotid artery shows evidence of 40% to 59% stenosis (high end of range).  2. Left internal carotid artery shows evidence of 40% to 59% stenosis.  3. Bilateral external  carotid artery stenosis.  4. No significant changes from previous study done on 08/08/2009.    Recent Labs Lab 12/29/13 0402  NA 146  K 3.9  CL 104  CO2 30  BUN 39*  CREATININE 1.40*  CALCIUM 7.5*  GLUCOSE 108*   12/21/2013 Admit BMET Sodium   159 137 - 147 mEq/L Final   Potassium   4.6 3.7 - 5.3 mEq/L Final   Chloride   118 96 - 112 mEq/L Final   CO2   14 19 - 32 mEq/L Final   BUN   175* 6 - 23 mg/dL Final   Creatinine, Ser   3.99* (peak 4.20) 0.50 - 1.35 mg/dL Final   FOB 02/19: Positive  CK on admission 304 Results for orders placed during the hospital encounter of 12/21/13 (from the past 24 hour(s))  GLUCOSE, CAPILLARY     Status: Abnormal   Collection Time    12/28/13  7:34 PM      Result Value Ref Range   Glucose-Capillary 108 (*) 70 - 99 mg/dL  GLUCOSE, CAPILLARY     Status: Abnormal   Collection Time    12/29/13 12:27 AM      Result Value Ref Range   Glucose-Capillary 136 (*) 70 - 99 mg/dL   Comment 1 Documented in Chart     Comment 2 Notify RN    GLUCOSE,  CAPILLARY     Status: Abnormal   Collection Time    12/29/13  3:49 AM      Result Value Ref Range   Glucose-Capillary 103 (*) 70 - 99 mg/dL   Comment 1 Documented in Chart     Comment 2 Notify RN    BASIC METABOLIC PANEL     Status: Abnormal   Collection Time    12/29/13  4:02 AM      Result Value Ref Range   Sodium 146  137 - 147 mEq/L   Potassium 3.9  3.7 - 5.3 mEq/L   Chloride 104  96 - 112 mEq/L   CO2 30  19 - 32 mEq/L   Glucose, Bld 108 (*) 70 - 99 mg/dL   BUN 39 (*) 6 - 23 mg/dL   Creatinine, Ser 1.40 (*) 0.50 - 1.35 mg/dL   Calcium 7.5 (*) 8.4 - 10.5 mg/dL   GFR calc non Af Amer 46 (*) >90 mL/min   GFR calc Af Amer 53 (*) >90 mL/min  MAGNESIUM     Status: None   Collection Time    12/29/13  4:02 AM      Result Value Ref Range   Magnesium 2.0  1.5 - 2.5 mg/dL  PHOSPHORUS     Status: None   Collection Time    12/29/13  4:02 AM      Result Value Ref Range   Phosphorus 4.4  2.3 - 4.6 mg/dL  CBC     Status: Abnormal   Collection Time    12/29/13  6:21 AM      Result Value Ref Range   WBC 7.7  4.0 - 10.5 K/uL   RBC 3.85 (*) 4.22 - 5.81 MIL/uL   Hemoglobin 11.3 (*) 13.0 - 17.0 g/dL   HCT 34.7 (*) 39.0 - 52.0 %   MCV 90.1  78.0 - 100.0 fL   MCH 29.4  26.0 - 34.0 pg   MCHC 32.6  30.0 - 36.0 g/dL   RDW 16.4 (*) 11.5 - 15.5 %   Platelets 157  150 - 400 K/uL  GLUCOSE, CAPILLARY     Status: Abnormal   Collection Time    12/29/13  7:57 AM      Result Value Ref Range   Glucose-Capillary 116 (*) 70 - 99 mg/dL  GLUCOSE, CAPILLARY     Status: Abnormal   Collection Time    12/29/13 12:14 PM      Result Value Ref Range   Glucose-Capillary 111 (*) 70 - 99 mg/dL   Comment 1 Notify RN     Radiology:  Dg Chest Port 1 View 12/29/2013   CLINICAL DATA:  Shortness of breath.  ARDS.  EXAM: PORTABLE CHEST - 1 VIEW  COMPARISON:  DG CHEST 1V PORT dated 12/28/2013  FINDINGS: Right IJ line at the cavoatrial junction. Cardiomegaly with normal pulmonary vascularity. Bilateral pulmonary  alveolar infiltrates are present and are unchanged. These infiltrates could be secondary to pulmonary edema, ARDS, and or pneumonia. Small pleural effusions cannot be excluded. The right costophrenic angle is not imaged. No pneumothorax. Degenerative changes both shoulders.  Electronically Signed   By: Marcello Moores  Register   On: 12/29/2013 08:14  IMPRESSION: 1. Right IJ line with tip at the cavoatrial junction. 2. Persistent bilateral pulmonary alveolar infiltrates. Pulmonary edema, ARDS, and/or pneumonia could present in this fashion. There has been no significant interval change from prior exam. 3. Cardiomegaly with normal pulmonary vascularity. Small pleural effusions cannot be excluded.    Dg Chest Port 1 View 12/28/2013   CLINICAL DATA:  81 year old male shortness of breath. Renal failure, respiratory failure. Initial encounter.  EXAM: PORTABLE CHEST - 1 VIEW  COMPARISON:  12/27/2013 and earlier.  FINDINGS: Portable AP semi upright view at 0407 hr. Stable right IJ central line. Stable cardiomegaly and mediastinal contours. Continued bilateral veiling pulmonary opacity in confluent retrocardiac opacity. [name] regression of pulmonary vascular congestion. No pneumothorax.  IMPRESSION: Mildly regressed pulmonary edema. Bilateral pleural effusions and bilateral lower lobe collapse/ consolidation.   Electronically Signed   By: Lars Pinks M.D.   On: 12/28/2013 07:32   Scheduled Meds: . antiseptic oral rinse  15 mL Mouth Rinse QID  . chlorhexidine  15 mL Mouth Rinse BID  . ciprofloxacin  400 mg Intravenous Q12H  . collagenase   Topical Daily  . feeding supplement   30 mL Oral Q1500  . furosemide  40 mg Intravenous Q12H  . insulin aspart  0-15 Units Subcutaneous 6 times per day  . pantoprazole sodium  40 mg Per Tube Daily   Continuous Infusions: . sodium chloride 20 mL/hr (12/26/13 1618)  . dextrose 40 mL/hr at 12/28/13 2327    ASSESSMENT: Active Problems: 1.  LVD - Systolic CHF 2.  Acute renal  failure 3.  Acute respiratory failure 4.  Altered mental status 5.  GI bleed 6.  Malnutrition of moderate degree 7. Partial NCB - No CPR, Defib; all meds OK  PLAN/DISCUSSION: Shane Pope was admitted 8 days ago with Acute Resp Failure and multiple metabolic derangements. He was found to have LVD and has volume overload at this time. He is on Lasix 40 mg IV q12 hr. Continue to follow I/O, daily weights and RF closely.  Think he may be able to tolerate low-dose Coreg but no ACE right now with recent acute renal failure and shock. MD advise on adding Coreg 3.125 BID (or wait as Levophed just d/c'd yesterday). Will continue to follow. He will need an ischemic eval, timing per MD. Advise on adding ASA (FOB + x 1).   Rosaria Ferries, PA-C 12/29/2013 3:49  PM Beeper [phone]  History and all data above reviewed.  Patient examined.  I agree with the findings as above. He is confused.  He denies any chest pain or SOB. He reports no history prior to this admission.  However, his verbal history is suspect. He is status post out of hospital arrest. He is now found to have a newly reduced EF.   The patient exam reveals COR:RRR  ,  Lungs: Decreased breath sounds bilateral,  Abd: Positive bowel sounds, no rebound no guarding, Ext No edema  .  All available labs, radiology testing, previous records reviewed. Agree with documented assessment and plan.   Given the cardiomyopathy and out of hospital arrest the patient will need a cardiac cath at some point likely before discharge if he has a substantial recovery from his acute events.  However, for now we will continue supportive therapy.  We cannot titrate ACE inhibitor or beta blocker at this point because of labile blood pressures.  We will follow with you.   Jeneen Rinks Aryan Sparks  6:06 PM  12/29/2013

## 2013-12-29 NOTE — Progress Notes (Signed)
PANDA placed. Order place to start tube feeds. Howard Pouch DO PGY-2

## 2013-12-29 NOTE — Evaluation (Signed)
Physical Therapy Evaluation Patient Details Name: Shane Pope MRN: [id number] DOB: Mar 21, 1931 Today's Date: 12/29/2013 Time: 5956-3875 PT Time Calculation (min): 21 min  PT Assessment / Plan / Recommendation History of Present Illness  80 year old male found on floor by family. Confused, unable to get up. In ER found to be hypotensive and in acute renal failure w/ profound metabolic derangements. VDRF 2/16-2/21 self-extubated  Clinical Impression  Pt pleasantly confused and unaware of situation, place or year. Pt with poor planning and problem solving for bed mobility but once EOB more alert and engaged with balance and mobility. Pt with deficits listed below and will benefit from acute therapy to maximize mobility, strength, function, balance and gait to increase pt function and decrease burden of care prior to discharge. Pt on 55% venturi throughout with sats in the 90s.     PT Assessment  Patient needs continued PT services    Follow Up Recommendations  SNF;Supervision/Assistance - 24 hour    Does the patient have the potential to tolerate intense rehabilitation      Barriers to Discharge Decreased caregiver support      Equipment Recommendations  Rolling walker with 5" wheels    Recommendations for Other Services     Frequency Min 3X/week    Precautions / Restrictions Precautions Precautions: Fall , multiple lines flexiseal and catheter  Pertinent Vitals/Pain HR 86-100 No pain      Mobility  Bed Mobility Overal bed mobility: Needs Assistance;+ 2 for safety/equipment;+2 for physical assistance Bed Mobility: Rolling;Sidelying to Sit Rolling: Max assist;+2 for safety/equipment Sidelying to sit: Max assist;+2 for physical assistance;+2 for safety/equipment General bed mobility comments: cueing for sequence, safety, lines and mobility with assist to rotate pelvis and elevate trunk from surface Transfers Overall transfer level: Needs assistance Equipment used:  None Transfers: Sit to/from [facility] Sit to Stand: +2 safety/equipment;+2 physical assistance;Min assist Stand pivot transfers: Mod assist;+2 safety/equipment;+2 physical assistance General transfer comment: Pt with assist to position bil LE prior to standing with cues for sequence and min assist to stand from surface. With pivot to chair pt initially advancing RLE but not LLE and midway through transfer stopped advancing bil LE and needed assist to pivot fully to chair and position extremities end of session.     Exercises     PT Diagnosis: Difficulty walking;Altered mental status;Generalized weakness  PT Problem List: Decreased strength;Decreased cognition;Decreased activity tolerance;Decreased safety awareness;Decreased knowledge of use of DME;Decreased balance;Decreased mobility;Cardiopulmonary status limiting activity PT Treatment Interventions: Gait training;Functional mobility training;Therapeutic activities;Therapeutic exercise;Patient/family education;Cognitive remediation;Neuromuscular re-education;DME instruction;Balance training     PT Goals(Current goals can be found in the care plan section) Acute Rehab PT Goals PT Goal Formulation: Patient unable to participate in goal setting Time For Goal Achievement: 01/12/14 Potential to Achieve Goals: Fair  Visit Information  Last PT Received On: 12/29/13 Assistance Needed: +2 History of Present Illness: 80 year old male found on floor by family. Confused, unable to get up. In ER found to be hypotensive and in acute renal failure w/ profound metabolic derangements. VDRF 2/16-2/21 self-extubated       Prior [place] expects to be discharged to:: Skilled nursing facility Living Arrangements: Alone Type of Home: House Home Layout: One level Prior Function Level of Independence: Independent Communication Communication: Expressive difficulties (pt on venturi with poor enunciation at  times)    Cognition  Cognition Arousal/Alertness: Awake/alert Behavior During Therapy: WFL for tasks assessed/performed Overall Cognitive Status: Impaired/Different from baseline Area of  Impairment: Orientation;Attention;Memory;Safety/judgement;Problem solving Orientation Level: Disoriented to;Time;Situation;Place Current Attention Level: Focused Memory: Decreased short-term memory Safety/Judgement: Decreased awareness of safety;Decreased awareness of deficits Problem Solving: Slow processing;Decreased initiation;Difficulty sequencing;Requires verbal cues;Requires tactile cues    Extremity/Trunk Assessment Upper Extremity Assessment Upper Extremity Assessment: Generalized weakness Lower Extremity Assessment Lower Extremity Assessment: Generalized weakness Cervical / Trunk Assessment Cervical / Trunk Assessment: Normal   Balance Balance Overall balance assessment: Needs assistance Sitting-balance support: Bilateral upper extremity supported;Feet supported Sitting balance-Leahy Scale: Fair Standing balance-Leahy Scale: Zero  End of Session PT - End of Session Equipment Utilized During Treatment: Gait belt;Oxygen Activity Tolerance: Patient tolerated treatment well Patient left: in chair;with call bell/phone within reach;with restraints reapplied (bil mittens) Nurse Communication: Mobility status;Need for lift equipment;Precautions  GP     Melford Aase 12/29/2013, 1:21 PM Elwyn Reach, [place]

## 2013-12-29 NOTE — Progress Notes (Signed)
12/28/13 2345  Pt afternoon K - 3.6. Pt on lasix BID now. Pt having frequent PVCs. eLink MD notified. Also asked about morning ABG for pt.   I: New orders for K received. No need for morning ABG per MD.  Wyn Quaker RN

## 2013-12-29 NOTE — Clinical Social Work Note (Signed)
CSW left message with Daughter, Magda Paganini [phone].  CSW awaiting a return call discuss projected disposition.  Nonnie Done, [place] [phone]  Clinical Social Work

## 2013-12-29 NOTE — Progress Notes (Signed)
Speech Language Pathology Treatment: Dysphagia  Patient Details Name: Shane Pope MRN: [id number] DOB: 1931-02-16 Today's Date: 12/29/2013 Time: 2482-5003 SLP Time Calculation (min): 12 min  Assessment / Plan / Recommendation Clinical Impression  Pt continues to be alert, cooperative, however, also continues to have altered mentation, poor secretion management, and increased O2 requirements (now requiring ventimask).  Per RN, pt will likely have Panda placed today for nutritional support.  ST to continue efforts in establishing po readiness.     HPI HPI: 80 year old male with PMH of GERD, COPD, anemia, living independently, found on floor by family 2/16. Confused, unable to get up. In ER found to be hypotensive and in acute renal failure w/ profound metabolic derangements. Intubated 2/16, self extubated 2/21.    Pertinent Vitals VSS  SLP Plan  Continue with current plan of care    Recommendations Diet recommendations: NPO Medication Administration: Via alternative means              Oral Care Recommendations: Oral care Q4 per protocol (oral care using suctin) Plan: Continue with current plan of care    GO    Shane Pope B. Quentin Ore [facility], CCC-SLP [phone]  Shonna Chock 12/29/2013, 10:25 AM

## 2013-12-29 NOTE — Progress Notes (Signed)
12/29/13 0415  RN Warner Mccreedy has attempted to place NG tube x 2 in addition to RN Groce (AM shift) attempting. Pt remains agitated and coughing up secretions. Pt desatting down to 80s and remaining agitated. Warren Lacy RN and MD made aware.   Plan: Will reevaluate need on AM shift.   Wyn Quaker RN

## 2013-12-29 NOTE — Progress Notes (Signed)
ANTIBIOTIC CONSULT NOTE - FOLLOW UP  Pharmacy Consult for ciprofloxacin Indication: pneumonia  No Known Allergies  Patient Measurements: Height: 6' 0.83" (185 cm) Weight: 176 lb 9.4 oz (80.1 kg) IBW/kg (Calculated) : 79.52  Vital Signs: Temp: 97.9 F (36.6 C) (02/24 0802) Temp src: Oral (02/24 0802) BP: 137/49 mmHg (02/24 0600) Pulse Rate: 71 (02/24 0600) Intake/Output from previous day: 02/23 0701 - 02/24 0700 In: 1510 [I.V.:1010; IV Piggyback:450] Out: 4025 [Urine:3925; Stool:100] Intake/Output from this shift:    Labs:  Recent Labs  12/27/13 0625  12/28/13 0200 12/28/13 0400 12/28/13 1400 12/29/13 0402 12/29/13 0621  WBC 9.3  --   --  10.4  --   --  7.7  HGB 11.6*  --   --  11.1*  --   --  11.3*  PLT 128*  --   --  130*  --   --  157  CREATININE  --   < > 1.37*  --  1.34 1.40*  --   < > = values in this interval not displayed. Estimated Creatinine Clearance: 47.3 ml/min (by C-G formula based on Cr of 1.4). No results found for this basename: VANCOTROUGH, VANCOPEAK, VANCORANDOM, GENTTROUGH, GENTPEAK, GENTRANDOM, TOBRATROUGH, TOBRAPEAK, TOBRARND, AMIKACINPEAK, AMIKACINTROU, AMIKACIN,  in the last 72 hours   Microbiology: Recent Results (from the past 720 hour(s))  CULTURE, BLOOD (ROUTINE X 2)     Status: None   Collection Time    12/21/13 11:52 PM      Result Value Ref Range Status   Specimen Description BLOOD RIGHT HAND   Final   Special Requests     Final   Value: BOTTLES DRAWN AEROBIC AND ANAEROBIC 10CC BLUE 5CC RED   Culture  Setup Time     Final   Value: 12/22/2013 07:56     Performed at [facility]   Culture     Final   Value: NO GROWTH 5 DAYS     Performed at [facility]   Report Status 12/28/2013 FINAL   Final  CULTURE, RESPIRATORY (NON-EXPECTORATED)     Status: None   Collection Time    12/22/13  2:20 AM      Result Value Ref Range Status   Specimen Description TRACHEAL ASPIRATE   Final   Special Requests NONE   Final   Gram  Stain     Final   Value: MODERATE WBC PRESENT, PREDOMINANTLY PMN     NO SQUAMOUS EPITHELIAL CELLS SEEN     MODERATE GRAM POSITIVE RODS     FEW GRAM NEGATIVE RODS     RARE YEAST   Culture     Final   Value: ABUNDANT PSEUDOMONAS AERUGINOSA     MODERATE YEAST CONSISTENT WITH CANDIDA SPECIES     Performed at [facility]   Report Status 12/24/2013 FINAL   Final   Organism ID, Bacteria PSEUDOMONAS AERUGINOSA   Final  URINE CULTURE     Status: None   Collection Time    12/22/13  2:56 AM      Result Value Ref Range Status   Specimen Description URINE, RANDOM   Final   Special Requests NONE   Final   Culture  Setup Time     Final   Value: 12/22/2013 08:04     Performed at [place]     Final   Value: NO GROWTH     Performed at [facility]  Final   Value: NO GROWTH     Performed at [facility]   Report Status 12/22/2013 FINAL   Final  MRSA PCR SCREENING     Status: None   Collection Time    12/22/13  2:56 AM      Result Value Ref Range Status   MRSA by PCR NEGATIVE  NEGATIVE Final   Comment:            The GeneXpert MRSA Assay (FDA     approved for NASAL specimens     only), is one component of a     comprehensive MRSA colonization     surveillance program. It is not     intended to diagnose MRSA     infection nor to guide or     monitor treatment for     MRSA infections.    Anti-infectives   Start     Dose/Rate Route Frequency Ordered Stop   12/25/13 1200  ciprofloxacin (CIPRO) IVPB 400 mg     400 mg 200 mL/hr over 60 Minutes Intravenous Every 12 hours 12/25/13 1054 01/06/14 1159   12/23/13 1100  piperacillin-tazobactam (ZOSYN) IVPB 3.375 g  Status:  Discontinued     3.375 g 12.5 mL/hr over 240 Minutes Intravenous Every 8 hours 12/23/13 1052 12/25/13 1054   12/22/13 0215  ampicillin-sulbactam (UNASYN) 1.5 g in sodium chloride 0.9 % 50 mL IVPB  Status:  Discontinued     1.5 g 100 mL/hr over 30 Minutes  Intravenous Every 12 hours 12/22/13 0201 12/23/13 1051   12/22/13 0115  piperacillin-tazobactam (ZOSYN) IVPB 3.375 g     3.375 g 100 mL/hr over 30 Minutes Intravenous  Once 12/22/13 0113 12/24/13 2004   12/22/13 0045  cefTRIAXone (ROCEPHIN) 1 g in dextrose 5 % 50 mL IVPB  Status:  Discontinued     1 g 100 mL/hr over 30 Minutes Intravenous Every 24 hours 12/22/13 0043 12/22/13 1056   12/21/13 2330  cefTRIAXone (ROCEPHIN) 1 g in dextrose 5 % 50 mL IVPB  Status:  Discontinued     1 g 100 mL/hr over 30 Minutes Intravenous Every 24 hours 12/21/13 2317 12/21/13 2320      Assessment: 82 YOM admitted from home after being found down. Pan sensisitive pseuduomonas grew out in respiratory culture. Was started on Zosyn 2/18, now with sensitivities to change to ciprofloxacin. Today is D#5 of cipro and planning a course of 14 days. Scr has stabilized and is 1.4 today. Dose is appropriate Pt is afebrile and WBC Is WNL.   Goal of Therapy:  Eradication of infection  Plan:  1. Continue ciprofloxacin 400mg  IV q12h x 14 days  2. F/u renal fxn, C&S, clinical status  Salome Arnt, PharmD, BCPS Pager # [phone] 12/29/2013 8:50 AM

## 2013-12-30 ENCOUNTER — Inpatient Hospital Stay (HOSPITAL_COMMUNITY): Payer: Medicare Other

## 2013-12-30 DIAGNOSIS — R5383 Other fatigue: Secondary | ICD-10-CM

## 2013-12-30 DIAGNOSIS — R1310 Dysphagia, unspecified: Secondary | ICD-10-CM

## 2013-12-30 DIAGNOSIS — R5381 Other malaise: Secondary | ICD-10-CM

## 2013-12-30 DIAGNOSIS — Z515 Encounter for palliative care: Secondary | ICD-10-CM

## 2013-12-30 DIAGNOSIS — R55 Syncope and collapse: Secondary | ICD-10-CM

## 2013-12-30 LAB — GLUCOSE, CAPILLARY
GLUCOSE-CAPILLARY: 124 mg/dL — AB (ref 70–99)
Glucose-Capillary: 115 mg/dL — ABNORMAL HIGH (ref 70–99)
Glucose-Capillary: 144 mg/dL — ABNORMAL HIGH (ref 70–99)
Glucose-Capillary: 117 mg/dL — ABNORMAL HIGH (ref 70–99)
Glucose-Capillary: 115 mg/dL — ABNORMAL HIGH (ref 70–99)
Glucose-Capillary: 120 mg/dL — ABNORMAL HIGH (ref 70–99)

## 2013-12-30 LAB — CBC
HEMATOCRIT: 34.2 % — AB (ref 39.0–52.0)
HEMOGLOBIN: 10.9 g/dL — AB (ref 13.0–17.0)
MCH: 28.8 pg (ref 26.0–34.0)
MCHC: 31.9 g/dL (ref 30.0–36.0)
MCV: 90.5 fL (ref 78.0–100.0)
Platelets: 181 10*3/uL (ref 150–400)
RBC: 3.78 MIL/uL — ABNORMAL LOW (ref 4.22–5.81)
RDW: 16.3 % — AB (ref 11.5–15.5)
WBC: 6.3 10*3/uL (ref 4.0–10.5)

## 2013-12-30 LAB — BASIC METABOLIC PANEL
BUN: 39 mg/dL — AB (ref 6–23)
CALCIUM: 8 mg/dL — AB (ref 8.4–10.5)
CO2: 32 mEq/L (ref 19–32)
Chloride: 101 mEq/L (ref 96–112)
Creatinine, Ser: 1.33 mg/dL (ref 0.50–1.35)
GFR calc non Af Amer: 49 mL/min — ABNORMAL LOW (ref >90)
GFR, EST AFRICAN AMERICAN: 57 mL/min — AB (ref >90)
GLUCOSE: 126 mg/dL — AB (ref 70–99)
POTASSIUM: 3.4 meq/L — AB (ref 3.7–5.3)
Sodium: 145 mEq/L (ref 137–147)

## 2013-12-30 LAB — PHOSPHORUS
PHOSPHORUS: 4.8 mg/dL — AB (ref 2.3–4.6)
Phosphorus: 4.9 mg/dL — ABNORMAL HIGH (ref 2.3–4.6)

## 2013-12-30 LAB — MAGNESIUM
MAGNESIUM: 1.9 mg/dL (ref 1.5–2.5)
MAGNESIUM: 2 mg/dL (ref 1.5–2.5)

## 2013-12-30 MED ORDER — OXYCODONE-ACETAMINOPHEN 5-325 MG PO TABS
1.0000 | ORAL_TABLET | Freq: Four times a day (QID) | ORAL | Status: DC | PRN
  Administered 2013-12-30: 1 via ORAL
  Filled 2013-12-30: qty 1

## 2013-12-30 MED ORDER — GLUCERNA 1.2 CAL PO LIQD
1000.0000 mL | ORAL | Status: DC
  Administered 2013-12-30 – 2014-01-01 (×4): 1000 mL
  Filled 2013-12-30 (×9): qty 1000

## 2013-12-30 MED ORDER — MORPHINE SULFATE 2 MG/ML IJ SOLN
0.5000 mg | INTRAMUSCULAR | Status: DC | PRN
  Administered 2013-12-30 – 2013-12-31 (×3): 1 mg via INTRAVENOUS
  Filled 2013-12-30 (×3): qty 1

## 2013-12-30 MED ORDER — POTASSIUM CHLORIDE 20 MEQ/15ML (10%) PO LIQD
20.0000 meq | Freq: Every day | ORAL | Status: DC
  Administered 2013-12-31 – 2014-01-02 (×3): 20 meq
  Filled 2013-12-30 (×4): qty 15

## 2013-12-30 MED ORDER — POTASSIUM CHLORIDE 20 MEQ/15ML (10%) PO LIQD
20.0000 meq | ORAL | Status: AC
  Administered 2013-12-30 (×2): 20 meq
  Filled 2013-12-30 (×2): qty 15

## 2013-12-30 NOTE — Progress Notes (Signed)
Referring Physician:  Primary Physician: [facility], Dr. Brigitte Pulse Primary Cardiologist:  PR saw in 2011 Reason for Consultation: Abnl Echo  HPI: 2011: Syncope, brief, while sitting in bed. Seen by PR, carotid dopplers, echo and CL performed. Results below, no cardiac cause found.   12/21/2013: 80 year old male found on floor by family. Confused, unable to get up. In ER found to be hypotensive and in acute renal failure w/ profound metabolic derangements. PCCM admitted.   Pt treated for PNA, hypovolemic shock, ARF with metabolic acidosis  123456:  PEA arrest (<30 sec) secondary to airway occlusion, asp PNA. An hour later, pt became hypoxic and hypotensive, possible ST changes (underlying LBBB), levophed started. Troponin 0.42, lactic acid level 1.8. 2D Echo 02/18 with EF 30-35%, + WMA, grade 1 DD.  02/19: Resp Cx + pseudomonas. ABX focused.   02/21: pt pulled out ET tube. He had been started on Lasix at 60 mg q 8 hr, but was increased later to 120 mg q 8 hr.  Levophed d/c'd but restarted when pt became hypotensive.  02/22: 12 bt run NSVT noted Mg and K+ checked and supplemented PRN.   02/23: Levophed d/c'd  Cardiology asked to see. Mr. Buhl does not remember having any chest pain. His SOB is better today than yesterday. He is oriented to name and place. He answers questions appropriately as he is able, but does not remember details of his admission.      Past Medical History  Diagnosis Date  . Hypertension   . Diabetes mellitus   . Anemia   . Blood transfusion     hx of   . GERD (gastroesophageal reflux disease)   . Arthritis   . 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 Fracture     hx of right wrist fracture   . Fracture, ribs     hx of on right side   . Pneumothorax on right   . PVD (peripheral vascular disease)     Bilateral ICA stenosis 40-59% 2011  . Hyperlipidemia   . LBBB (left bundle branch block)    Past Surgical History  Procedure Laterality Date    . Hernia repair      right inguinal hernia repair   . Other surgical history      undescended testicle surgery  . Cholecystectomy    . Hemorrhoid surgery    . Eye surgery      bilateral cataract surgery  . Knee arthroscopy      right knee   . Appendectomy    . Total knee arthroplasty  03/24/2012    Procedure: TOTAL KNEE ARTHROPLASTY;  Surgeon: Gearlean Alf, MD;  Location: [facility];  Service: Orthopedics;  Laterality: Right;     Current Medications: . antiseptic oral rinse  15 mL Mouth Rinse QID  . chlorhexidine  15 mL Mouth Rinse BID  . ciprofloxacin  400 mg Intravenous Q12H  . collagenase   Topical Daily  . feeding supplement (PRO-STAT SUGAR FREE 64)  30 mL Per Tube BID  . feeding supplement (VITAL HIGH PROTEIN)  1,000 mL Per Tube Q24H  . furosemide  40 mg Intravenous Q12H  . influenza vac split quadrivalent PF  0.5 mL Intramuscular Tomorrow-1000  . insulin aspart  0-15 Units Subcutaneous 6 times per day  . pantoprazole sodium  40 mg Per Tube Daily  . pneumococcal 23 valent vaccine  0.5 mL Intramuscular Tomorrow-1000  .  potassium chloride  20 mEq Per Tube Q4H   Infusions:     Prescriptions prior to admission  Medication Sig Dispense Refill  . furosemide (LASIX) 40 MG tablet Take 40 mg by mouth daily.        No Known Allergies  History   Social History  . Marital Status: Divorced    Spouse Name: N/A    Number of Children: N/A  . Years of Education: N/A   Occupational History  . Retired    Social History Main Topics  . Smoking status: Current Every Day Smoker -- 60 years    Types: Cigars  . Smokeless tobacco: Never Used  . Alcohol Use: No  . Drug Use: No  . Sexual Activity:    Other Topics Concern  . Not on file   Social History Narrative   Pt lives alone, dtr nearby.    History reviewed. No other family history details available  Family Status  Relation Status Death Age  . Mother Deceased   . Father Deceased     PHYSICAL EXAM: Filed Vitals:    12/30/13 0855  BP:   Pulse:   Temp: 98.4 F (36.9 C)  Resp:     Intake/Output Summary (Last 24 hours) at 12/30/13 0902 Last data filed at 12/30/13 0600  Gross per 24 hour  Intake 8983.5 ml  Output   2935 ml  Net 6048.5 ml    General:  Frail-appearing elderly male.  Mild-mod respiratory difficulty HEENT: normal for age Neck: supple. Elevated JVD. Carotids 2+ bilat; no bruits noted but pt not able to hold his breath, resp noise present. No lymphadenopathy or thryomegaly appreciated. Cor: PMI nondisplaced. Regular rate & rhythm. No rubs, gallops or murmurs. Lungs: Bibasilar rales Abdomen: soft, nontender, nondistended. No hepatosplenomegaly. No bruits or masses. Good bowel sounds. Extremities: no cyanosis, clubbing, rash, no edema Neuro: alert & oriented x 2, cranial nerves grossly intact. moves all 4 extremities but has generalized weakness. Affect pleasant. Skin: decubitus ulcers present on sacrum, LUE - seen by WOC, dressed and not disturbed  ECG:  12/27/2013 SR, LBBB is old Rate 70  ECHO: 12/23/2013 Study Conclusions - Left ventricle: The cavity size was normal. Systolic function was moderately to severely reduced. The estimated ejection fraction was in the range of 30% to 35%. There is akinesis of the distalanteroseptal myocardium. Doppler parameters are consistent with abnormal left ventricular relaxation (grade 1 diastolic dysfunction). - Left atrium: The atrium was mildly dilated. - Right ventricle: The cavity size was mildly dilated. Wall thickness was normal. - Right atrium: The atrium was mildly dilated. - Pulmonary arteries: Systolic pressure was mildly to moderately increased. PA peak pressure: 65mm Hg (S). Impressions: - When compared to prior ECHO, EF is reduced.  ECHO: 04/17/2010 Study Conclusions - Left ventricle: The cavity size was normal. Systolic function was normal. The estimated ejection fraction was in the range of 55% to 60%. There is  dyssynergy of the septal and anteroseptal myocardium due to conduction abnormality. Doppler parameters are consistent with abnormal left ventricular relaxation (grade 1 diastolic dysfunction). - Ventricular septum: Septal motion showed dyssynergy. These changes are consistent with intraventricular conduction delay. - Mitral valve: Mild regurgitation. - Left atrium: The atrium was mildly to moderately dilated. - Right ventricle: The cavity size was normal. Wall thickness was mildly increased. Reader: D. Bensimhon  Adenosine CL 2011: Low risk study Quantitative Gated Spect Images  QGS EDV: 153 ml  QGS ESV: 64 ml  QGS EF 58% QGS cine  images: Abnromal septal motion  Findings  Low risk nuclear study  Overall Impression  Exercise Capacity: Lexiscan  BP Response: Normal blood pressure response.  Clinical Symptoms: Light headed  ECG Impression: LBBB  Overall Impression: Septal thinnng consistant with BBB no ischemia  Carotid Dopplers: 03/27/2010 IMPRESSION:  1. Right internal carotid artery shows evidence of 40% to 59% stenosis (high end of range).  2. Left internal carotid artery shows evidence of 40% to 59% stenosis.  3. Bilateral external carotid artery stenosis.  4. No significant changes from previous study done on 08/08/2009.    Recent Labs Lab 12/30/13 0255  NA 145  K 3.4*  CL 101  CO2 32  BUN 39*  CREATININE 1.33  CALCIUM 8.0*  GLUCOSE 126*   12/21/2013 Admit BMET Sodium   159 137 - 147 mEq/L Final   Potassium   4.6 3.7 - 5.3 mEq/L Final   Chloride   118 96 - 112 mEq/L Final   CO2   14 19 - 32 mEq/L Final   BUN   175* 6 - 23 mg/dL Final   Creatinine, Ser   3.99* (peak 4.20) 0.50 - 1.35 mg/dL Final   FOB 02/19: Positive  CK on admission 304 Results for orders placed during the hospital encounter of 12/21/13 (from the past 24 hour(s))  GLUCOSE, CAPILLARY     Status: Abnormal   Collection Time    12/29/13 12:14 PM      Result Value Ref Range    Glucose-Capillary 111 (*) 70 - 99 mg/dL   Comment 1 Notify RN    GLUCOSE, CAPILLARY     Status: Abnormal   Collection Time    12/29/13  4:44 PM      Result Value Ref Range   Glucose-Capillary 100 (*) 70 - 99 mg/dL  GLUCOSE, CAPILLARY     Status: None   Collection Time    12/29/13  7:03 PM      Result Value Ref Range   Glucose-Capillary 98  70 - 99 mg/dL  BASIC METABOLIC PANEL     Status: Abnormal   Collection Time    12/29/13  7:04 PM      Result Value Ref Range   Sodium 146  137 - 147 mEq/L   Potassium 3.5 (*) 3.7 - 5.3 mEq/L   Chloride 102  96 - 112 mEq/L   CO2 31  19 - 32 mEq/L   Glucose, Bld 109 (*) 70 - 99 mg/dL   BUN 38 (*) 6 - 23 mg/dL   Creatinine, Ser 1.35  0.50 - 1.35 mg/dL   Calcium 8.1 (*) 8.4 - 10.5 mg/dL   GFR calc non Af Amer 48 (*) >90 mL/min   GFR calc Af Amer 56 (*) >90 mL/min  MAGNESIUM     Status: None   Collection Time    12/29/13  7:04 PM      Result Value Ref Range   Magnesium 2.1  1.5 - 2.5 mg/dL  PHOSPHORUS     Status: None   Collection Time    12/29/13  7:04 PM      Result Value Ref Range   Phosphorus 4.4  2.3 - 4.6 mg/dL  GLUCOSE, CAPILLARY     Status: Abnormal   Collection Time    12/30/13 12:22 AM      Result Value Ref Range   Glucose-Capillary 124 (*) 70 - 99 mg/dL   Comment 1 Notify RN    BASIC METABOLIC PANEL  Status: Abnormal   Collection Time    12/30/13  2:55 AM      Result Value Ref Range   Sodium 145  137 - 147 mEq/L   Potassium 3.4 (*) 3.7 - 5.3 mEq/L   Chloride 101  96 - 112 mEq/L   CO2 32  19 - 32 mEq/L   Glucose, Bld 126 (*) 70 - 99 mg/dL   BUN 39 (*) 6 - 23 mg/dL   Creatinine, Ser 1.33  0.50 - 1.35 mg/dL   Calcium 8.0 (*) 8.4 - 10.5 mg/dL   GFR calc non Af Amer 49 (*) >90 mL/min   GFR calc Af Amer 57 (*) >90 mL/min  GLUCOSE, CAPILLARY     Status: Abnormal   Collection Time    12/30/13  4:18 AM      Result Value Ref Range   Glucose-Capillary 115 (*) 70 - 99 mg/dL   Comment 1 Notify RN    CBC     Status: Abnormal    Collection Time    12/30/13  4:34 AM      Result Value Ref Range   WBC 6.3  4.0 - 10.5 K/uL   RBC 3.78 (*) 4.22 - 5.81 MIL/uL   Hemoglobin 10.9 (*) 13.0 - 17.0 g/dL   HCT 34.2 (*) 39.0 - 52.0 %   MCV 90.5  78.0 - 100.0 fL   MCH 28.8  26.0 - 34.0 pg   MCHC 31.9  30.0 - 36.0 g/dL   RDW 16.3 (*) 11.5 - 15.5 %   Platelets 181  150 - 400 K/uL  MAGNESIUM     Status: None   Collection Time    12/30/13  6:04 AM      Result Value Ref Range   Magnesium 1.9  1.5 - 2.5 mg/dL  PHOSPHORUS     Status: Abnormal   Collection Time    12/30/13  6:04 AM      Result Value Ref Range   Phosphorus 4.9 (*) 2.3 - 4.6 mg/dL  GLUCOSE, CAPILLARY     Status: Abnormal   Collection Time    12/30/13  8:35 AM      Result Value Ref Range   Glucose-Capillary 144 (*) 70 - 99 mg/dL   Radiology:  Dg Chest Port 1 View 12/29/2013   CLINICAL DATA:  Shortness of breath.  ARDS.  EXAM: PORTABLE CHEST - 1 VIEW  COMPARISON:  DG CHEST 1V PORT dated 12/28/2013  FINDINGS: Right IJ line at the cavoatrial junction. Cardiomegaly with normal pulmonary vascularity. Bilateral pulmonary alveolar infiltrates are present and are unchanged. These infiltrates could be secondary to pulmonary edema, ARDS, and or pneumonia. Small pleural effusions cannot be excluded. The right costophrenic angle is not imaged. No pneumothorax. Degenerative changes both shoulders.  Electronically Signed   By: Marcello Moores  Register   On: 12/29/2013 08:14  IMPRESSION: 1. Right IJ line with tip at the cavoatrial junction. 2. Persistent bilateral pulmonary alveolar infiltrates. Pulmonary edema, ARDS, and/or pneumonia could present in this fashion. There has been no significant interval change from prior exam. 3. Cardiomegaly with normal pulmonary vascularity. Small pleural effusions cannot be excluded.    Dg Chest Port 1 View 12/28/2013   CLINICAL DATA:  80 year old male shortness of breath. Renal failure, respiratory failure. Initial encounter.  EXAM: PORTABLE CHEST - 1  VIEW  COMPARISON:  12/27/2013 and earlier.  FINDINGS: Portable AP semi upright view at 0407 hr. Stable right IJ central line. Stable cardiomegaly and mediastinal contours. Continued bilateral  veiling pulmonary opacity in confluent retrocardiac opacity. [name] regression of pulmonary vascular congestion. No pneumothorax.  IMPRESSION: Mildly regressed pulmonary edema. Bilateral pleural effusions and bilateral lower lobe collapse/ consolidation.   Electronically Signed   By: Lars Pinks M.D.   On: 12/28/2013 07:32   Scheduled Meds: . antiseptic oral rinse  15 mL Mouth Rinse QID  . chlorhexidine  15 mL Mouth Rinse BID  . ciprofloxacin  400 mg Intravenous Q12H  . collagenase   Topical Daily  . feeding supplement   30 mL Oral Q1500  . furosemide  40 mg Intravenous Q12H  . insulin aspart  0-15 Units Subcutaneous 6 times per day  . pantoprazole sodium  40 mg Per Tube Daily   Continuous Infusions: . sodium chloride 20 mL/hr (12/26/13 1618)  . dextrose 40 mL/hr at 12/28/13 2327    ASSESSMENT: Active Problems: 1.  LVD - Systolic CHF 2.  Acute renal failure 3.  Acute respiratory failure 4.  Altered mental status 5.  GI bleed 6.  Malnutrition of moderate degree 7. Partial NCB - No CPR, Defib; all meds OK  PLAN/DISCUSSION: Mr. Macgowan was admitted 8 days ago with Acute Resp Failure and multiple metabolic derangements. He was found to have LVD and has volume overload at this time.    His LV function has worsened since his last evaluation several years ago.  He will need further evaluation when he is better.    Continue supportive care for now.    Thayer Headings, Brooke Bonito., MD, [facility] 12/30/2013, 9:08 AM Office - [phone] Pager [phone]

## 2013-12-30 NOTE — Progress Notes (Signed)
NUTRITION CONSULT/ FOLLOW UP  DOCUMENTATION CODES  Per approved criteria   -Non-severe (moderate) malnutrition in the context of chronic illness    Intervention:   Utilize 58M PEPuP Protocol:  Discontinue Vital High Protein. Utilize Glucerna 1.2 via NGT at new goal rate of 65 ml/h (1560 ml per day) and Prostat 30 ml BID to provide 2072 kcals, 124 gm protein, 1256 ml free water daily.  Nutrition Dx:   Inadequate oral intake related to inability to eat as evidenced by NPO status. Ongoing.  Goal:   Intake to meet >/= 90% of estimated nutrition needs, currently unmet  Monitor:   Weight trend, labs, TF rate and tolerance  Assessment:   80 year old male found on floor by family. Confused, unable to get up. In ER found to be hypotensive and in acute renal failure w/ profound metabolic derangements. Hypothermic on arrival 91 degrees. Patient intubated on 2/17.  Pt self extubated 2/21. Failed NGT placement 2/23. Panda tube placed 2/24. Pt was discussed during rounds. Current tube feeding of Vital High protein @ 40 ml/hr with Prostat 30 ml BID is providing 1160 kcals (58% of re-estimated kcals needs), 114 grams of protein (99% of re-estimated protein needs) , and 803 ml of free water.  Nutrition management was consulted for TF adjustment and management.  Height: Ht Readings from Last 1 Encounters:  12/22/13 6' 0.83" (1.85 m)    Weight Status:   Wt Readings from Last 1 Encounters:  12/30/13 176 lb 2.4 oz (79.9 kg)  12/29/13 176 lb 12/28/13 184 lb 12/27/13 187 lb 12/26/13 196 lb 12/25/13 193 lb 12/24/13 188 lb 12/23/13 181 lb Admission wt (2/17) 164 lb  Pt is positive 14,225 ml of fluid since admission (2/16)  Body mass index is 23.35 kg/(m^2).  Re-estimated needs:  Kcal: 2000-2200 Protein: 115-125 grams  Fluid: 2-2.2 L/day  Skin: 2 unstageable sacral pressure ulcers, deep tissue injury pressure ulcer on left elbow  Diet Order:  NPO   Intake/Output Summary (Last 24 hours)  at 12/30/13 0940 Last data filed at 12/30/13 0600  Gross per 24 hour  Intake 8983.5 ml  Output   2935 ml  Net 6048.5 ml    Last BM: 2/24-diarrhea-medium amount-rectal tube   Labs:   Recent Labs Lab 12/29/13 0402 12/29/13 1904 12/30/13 0255 12/30/13 0604  NA 146 146 145  --   K 3.9 3.5* 3.4*  --   CL 104 102 101  --   CO2 30 31 32  --   BUN 39* 38* 39*  --   CREATININE 1.40* 1.35 1.33  --   CALCIUM 7.5* 8.1* 8.0*  --   MG 2.0 2.1  --  1.9  PHOS 4.4 4.4  --  4.9*  GLUCOSE 108* 109* 126*  --     CBG (last 3)   Recent Labs  12/30/13 0022 12/30/13 0418 12/30/13 0835  GLUCAP 124* 115* 144*    Scheduled Meds: . antiseptic oral rinse  15 mL Mouth Rinse QID  . chlorhexidine  15 mL Mouth Rinse BID  . ciprofloxacin  400 mg Intravenous Q12H  . collagenase   Topical Daily  . feeding supplement (PRO-STAT SUGAR FREE 64)  30 mL Per Tube BID  . feeding supplement (VITAL HIGH PROTEIN)  1,000 mL Per Tube Q24H  . furosemide  40 mg Intravenous Q12H  . insulin aspart  0-15 Units Subcutaneous 6 times per day  . pantoprazole sodium  40 mg Per Tube Daily    Continuous  Infusions: None  Kallie Locks Dietetic Intern Pager: [phone]   I agree with student dietitian note; appropriate revisions have been made.  Molli Barrows, RD, LDN, [place] Pager# [phone] After Hours Pager# [phone]

## 2013-12-30 NOTE — Progress Notes (Signed)
[facility] ADULT ICU REPLACEMENT PROTOCOL FOR AM LAB REPLACEMENT ONLY  The patient does apply for the [facility] Adult ICU Electrolyte Replacment Protocol based on the criteria listed below:   1. Is GFR >/= 40 ml/min? yes  Patient's GFR today is 49 2. Is urine output >/= 0.5 ml/kg/hr for the last 6 hours? yes Patient's UOP is 1.8 ml/kg/hr 3. Is BUN < 60 mg/dL? yes  Patient's BUN today is 39 4. Abnormal electrolyte(K 3.4 5. Ordered repletion with: per protocol 6. If a panic level lab has been reported, has the CCM MD in charge been notified? yes.   Physician:  Dr Philbert Riser 12/30/2013 5:40 AM

## 2013-12-30 NOTE — Progress Notes (Signed)
Name: Shane Pope MRN: [id number] DOB: 1931/05/08    ADMISSION DATE:  12/21/2013 CONSULTATION DATE:  2/16  REFERRING MD :  [facility]  PRIMARY SERVICE: PCCM   CHIEF COMPLAINT:  Acute renal failure, resp failure  BRIEF PATIENT DESCRIPTION:  80 year old male found on floor by family. Confused, unable to get up. In ER found to be hypotensive and in acute renal failure w/ profound metabolic derangements. PCCM asked to admit   SIGNIFICANT EVENTS / STUDIES:  Lumbar spine 2/16: no acute fx Thoracic spine 2/16: no acute fx, old L1 compression fx w/ vertebroplasty  CT head 2/16: negative  Pelvis 2/16: neg  Renal US 2/17: no hydro or obstruction 2/17 - high pressor needs, acidotic 2/18 - Echo 30-35%, akinesis of distal anteroseptal myocardium, grade 1 diastolic dysfunction, PA pressure mildly-moderately increased 2/19- pressors improved 2/20 - pressors continue to improve 2/21 - off pressors 2/21 - self extubated, BP low with lasix and back on levophed, neg 2.8 liters 2/22 - continues to do well off vent 2/23 - rising pCO2 off vent 2/24 - panda tube placed  LINES / TUBES: 2/16 rt ij>>> 2/16 ett>>>2/21 2/17 rt rad>>>2/21 2/24 panda NGT >>>  CULTURES: BCX 2 2/16>>> NGTD UC 2/16>>> neg Resp Cx>>>abundant pseudomonas, pan sensitive  ANTIBIOTICS: 2/16 unasyn>>>2/18 2/18 zosyn>>>2/20 2/20 cipro>>>plan 14 days 2/18 vanc>>>2/19 2/16 ceftriaxone>>>2/17  SUBJECTIVE: did well overnight. Improved mental status. Afebrile.  VITAL SIGNS: Temp:  [97.3 F (36.3 C)-99 F (37.2 C)] 98.6 F (37 C) (02/25 0438) Pulse Rate:  [67-86] 69 (02/25 0600) Resp:  [20-33] 27 (02/25 0600) BP: (95-164)/(38-103) 121/46 mmHg (02/25 0600) SpO2:  [91 %-97 %] 96 % (02/25 0600) Weight:  [79.9 kg (176 lb 2.4 oz)] 79.9 kg (176 lb 2.4 oz) (02/25 0500) HEMODYNAMICS:   VENTILATOR SETTINGS:   INTAKE / OUTPUT: Intake/Output     02/24 0701 - 02/25 0700   I.V. (mL/kg) 920 (11.5)   NG/GT 7743.5    IV Piggyback 400   Total Intake(mL/kg) 9063.5 (113.4)   Urine (mL/kg/hr) 3135 (1.6)   Total Output 3135   Net +5928.5         PHYSICAL EXAMINATION: General:  Weak, frail elderly male Neuro:  Alert, oriented x3 HEENT:  50% venti mask in place CV: rrr, no murmur appreciated Lungs:  CTAB Abdomen:  Non-tender + bowel sounds Musculoskeletal: low muscle mass  Skin:  Bilateral toes with minimal purple discoloration, toes warm, skin on left arm covered with pink pad  LABS:  CBC  Recent Labs Lab 12/28/13 0400 12/29/13 0621 12/30/13 0434  WBC 10.4 7.7 6.3  HGB 11.1* 11.3* 10.9*  HCT 34.6* 34.7* 34.2*  PLT 130* 157 181   Coag's No results found for this basename: APTT, INR,  in the last 168 hours BMET  Recent Labs Lab 12/29/13 0402 12/29/13 1904 12/30/13 0255  NA 146 146 145  K 3.9 3.5* 3.4*  CL 104 102 101  CO2 30 31 32  BUN 39* 38* 39*  CREATININE 1.40* 1.35 1.33  GLUCOSE 108* 109* 126*   Electrolytes  Recent Labs Lab 12/28/13 0700  12/29/13 0402 12/29/13 1904 12/30/13 0255  CALCIUM  --   < > 7.5* 8.1* 8.0*  MG 2.3  --  2.0 2.1  --   PHOS 3.4  --  4.4 4.4  --   < > = values in this interval not displayed. Sepsis Markers No results found for this basename: LATICACIDVEN, PROCALCITON, O2SATVEN,  in the last 168  hours ABG  Recent Labs Lab 12/26/13 0330 12/26/13 2110 12/28/13 0516  PHART 7.427 7.436 7.483*  PCO2ART 41.4 40.3 58.9*  PO2ART 60.8* 117.0* 68.0*   Liver Enzymes No results found for this basename: AST, ALT, ALKPHOS, BILITOT, ALBUMIN,  in the last 168 hours Cardiac Enzymes  Recent Labs Lab 12/27/13 0815 12/27/13 1540 12/27/13 2015  TROPONINI <0.30 <0.30 <0.30   Glucose  Recent Labs Lab 12/29/13 0757 12/29/13 1214 12/29/13 1644 12/29/13 1903 12/30/13 0022 12/30/13 0418  GLUCAP 116* 111* 100* 98 124* 115*    Imaging Dg Abd 1 View  12/29/2013   CLINICAL DATA:  Pain the feeding tube positioning.  EXAM: ABDOMEN - 1 VIEW   COMPARISON:  US RENAL dated 12/22/2013; DG LUMBAR SPINE 2-3 VIEWS dated 12/21/2013  FINDINGS: A scanned in paper fluoroscopic spot image demonstrates a Panda feeding tube with its tip in the fourth portion of the duodenum, and contrast medium in the duodenum.  IMPRESSION: 1. Panda feeding tube tip: Fourth portion of the duodenum.   Electronically Signed   By: Sherryl Barters M.D.   On: 12/29/2013 17:04   Dg Chest Port 1 View  12/29/2013   CLINICAL DATA:  Shortness of breath.  ARDS.  EXAM: PORTABLE CHEST - 1 VIEW  COMPARISON:  DG CHEST 1V PORT dated 12/28/2013  FINDINGS: Right IJ line at the cavoatrial junction. Cardiomegaly with normal pulmonary vascularity. Bilateral pulmonary alveolar infiltrates are present and are unchanged. These infiltrates could be secondary to pulmonary edema, ARDS, and or pneumonia. Small pleural effusions cannot be excluded. The right costophrenic angle is not imaged. No pneumothorax. Degenerative changes both shoulders.  IMPRESSION: 1. Right IJ line with tip at the cavoatrial junction. 2. Persistent bilateral pulmonary alveolar infiltrates. Pulmonary edema, ARDS, and/or pneumonia could present in this fashion. There has been no significant interval change from prior exam. 3. Cardiomegaly with normal pulmonary vascularity. Small pleural effusions cannot be excluded.   Electronically Signed   By: Marcello Moores  Register   On: 12/29/2013 08:14   Dg [place] Dys Tube Plc W/fl-no Rad  12/29/2013   CLINICAL DATA: need panda tube placed - failed attempts in the ICU   NASO G TUBE PLACEMENT WITH FLUORO  Fluoroscopy was utilized by the requesting physician.  No radiographic  interpretation.      CXR: slight improvement in infiltrates, still with right pleural effusion  ASSESSMENT / PLAN:  PULMONARY A: Acute resp failure - improved Pseudomonas PNA ARDS Edema component P:   O2 supplemental to keep sats >90%, wean O2 as able to [place] pcxr tomorrow am Antibiotics per above Can use NIMV prn - good  cough DNR  CARDIOVASCULAR A: Hypovolemia, shock, septic - improved No REL AI Mild at best stress ischemia Echo 30-35%, akinesis of distal anteroseptal myocardium, grade 1 diastolic dysfunction, PA pressure mildly-moderately increased P:  Pressors off Will dose lasix again today - will follow volume status Appreciate cards input - cath only if he improves enough  RENAL A:   Acute renal failure - slight worsening with lasix hypernatremia - improved Volume overload Hypokalemia  P:   Hold antihypertensives - until BPs allow strict I&O BMET daily, mag and phos daily Will d/c D5W given tube feeds started  Replete k  GASTROINTESTINAL A:  NPO FOBT +, no gross blood P:    SLP for swallow eval - will continue to follow  Protonix Tube feeds started  HEMATOLOGIC A: Anemia - likely dilutional - stable P:  Trend CBC  SCDs with +  FOBT -outpt work up PPI  INFECTIOUS A:  aspiration, decub / arm wound Procalcitonin 0.20 Pseudomonal PNA P:   Wound care cipro to stop date - 14 day course pcxr in am   ENDOCRINE A:  H/O DM  TSH 0.816 - WNL No relative AI P:   SSI Will restart low dose lantus if cbgs > 150 cbgs  NEUROLOGIC A:  acute encephalopathy, agitation - improving Delirium  P:   Frequent re-orientation To chair PT - SNF or 24 hr supervision  DERM A: sacral pressure ulcer: unstageable      Pressure ulcer right arm: unstageable  P: WOC consult appreciated  Tommi Rumps, MD  [place] PGY-2  TODAY'S SUMMARY: wean O2 to [place], continue antibiotics to stop date, stop D5W, transfer to step down Based on d/w daughter, have requested palliative care consult for goals of care from here on, I do expect his swallowing to improve, but unsure how much physical mobility he will regain. Daughter clear that he would not want SNF placement but ok with short term rehab   Care during the described time interval was provided by me and/or other providers on  the critical care team.  I have reviewed this patient's available data, including medical history, events of note, physical examination and test results as part of my evaluation  CC time x 66m   ALVA,RAKESH V.

## 2013-12-30 NOTE — Clinical Social Work Note (Addendum)
10:49am- CSW updated FL2, but dc O2 needs to be established prior to SNF/STR search for appropriate placement.  CSW faxed clinical information to [facility] for review for SNF/STR and EMS authorization.  10:16am- CSW received a return call from daughter (full assessment to follow).  Daughter is agreeable to SNF recommendation.  First choice: [facility], SNF.   10;10am- Attempt #2 - CSW left message with Daughter, Magda Paganini [phone]. CSW awaiting a return call discuss projected disposition.  Nonnie Done, [place] [phone]  Clinical Social Work

## 2013-12-30 NOTE — Progress Notes (Signed)
Physical Therapy Wound Treatment Patient Details  Name: Shane Pope MRN: [id number] Date of Birth: May 23, 1931  Today's Date: 12/30/2013 Time: 2694-8546 Time Calculation (min): 49 min  Subjective  Subjective: Pt constantly moaning and groaning but when asked if he is hurting he said no it was just habit.  Pain Score:  Denied pain.  Wound Assessment  Pressure Ulcer 12/22/13 Unstageable - Full thickness tissue loss in which the base of the ulcer is covered by slough (yellow, tan, gray, green or brown) and/or eschar (tan, brown or black) in the wound bed. red (Active)  Dressing Type Moist to dry;Foam;Gauze (Comment);Barrier Film (skin prep);Other (Comment) 12/30/2013 10:23 AM  Dressing Changed 12/30/2013 10:23 AM  Dressing Change Frequency Daily 12/30/2013 10:23 AM  State of Healing Eschar 12/30/2013 10:23 AM  Site / Wound Assessment Brown;Yellow;Red 12/30/2013 10:23 AM  % Wound base Red or Granulating 60% 12/30/2013 10:23 AM  % Wound base Yellow 5% 12/30/2013 10:23 AM  % Wound base Black 35% 12/30/2013 10:23 AM  % Wound base Other (Comment) 0% 12/30/2013 10:23 AM  Peri-wound Assessment Purple 12/30/2013 10:23 AM  Wound Length (cm) 5.5 cm 12/28/2013 10:40 AM  Wound Width (cm) 9.5 cm 12/28/2013 10:40 AM  Wound Depth (cm) 0 cm 12/28/2013 10:40 AM  Margins Unattached edges (unapproximated) 12/30/2013 10:23 AM  Drainage Amount Minimal 12/30/2013 10:23 AM  Drainage Description Serosanguineous 12/30/2013 10:23 AM  Treatment Debridement (Selective);Hydrotherapy (Pulse lavage);Packing (Saline gauze) 12/30/2013 10:23 AM   Hydrotherapy Pulsed lavage therapy - wound location: sacrum Pulsed Lavage with Suction (psi): 8 psi Pulsed Lavage with Suction - Normal Saline Used: 1000 mL Pulsed Lavage Tip: Tip with splash shield Selective Debridement Selective Debridement - Location: sacrum Selective Debridement - Tools Used: Forceps;Scissors Selective Debridement - Tissue Removed: very small amount of black eschar    Wound Assessment and Plan  Wound Therapy - Assess/Plan/Recommendations Wound Therapy - Clinical Statement: Eschar remains adherent. Hydrotherapy Plan: Debridement;Dressing change;Patient/family education;Pulsatile lavage with suction Wound Therapy - Frequency: 3X / week Wound Therapy - Follow Up Recommendations: Skilled nursing facility Wound Plan: See above  Wound Therapy Goals- Improve the function of patient's integumentary system by progressing the wound(s) through the phases of wound healing (inflammation - proliferation - remodeling) by: Decrease Necrotic Tissue to: 25 Decrease Necrotic Tissue - Progress: Progressing toward goal Increase Granulation Tissue to: 75 Increase Granulation Tissue - Progress: Progressing toward goal  Goals will be updated until maximal potential achieved or discharge criteria met.  Discharge criteria: when goals achieved, discharge from hospital, MD decision/surgical intervention, no progress towards goals, refusal/missing three consecutive treatments without notification or medical reason.  GP     Angelo Prindle 12/30/2013, 10:28 AM  Suanne Marker PT [phone]

## 2013-12-30 NOTE — Consult Note (Signed)
Patient Shane Pope      DOB: 1930/12/30      DQQ:[id number]     Consult Note from the Palliative Medicine Team at [place] Requested by: Dr Elsworth Soho     PCP: No primary provider on file. Reason for Consultation:Clarification of GOC and options     Phone Number:None  Assessment of patients Current state:   Continued physical, functional and cognitive decline with full medical support.  Overall long term prognosis  Family facing anticipatory care needs and AD questions and concerns  Consult is for review of medical treatment options, clarification of goals of care and end of life issues, disposition and options, and symptom recommendation.  This NP Wadie Lessen reviewed medical records, received report from team, assessed the patient and then meet at the patient's bedside along with his daughter Shane Pope  to discuss diagnosis prognosis, [place], EOL wishes disposition and options.   A detailed discussion was had today regarding advanced directives.  Concepts specific to code status, artifical feeding and hydration, continued IV antibiotics and rehospitalization was had.  The difference between a aggressive medical intervention path  and a palliative comfort care path for this patient at this time was had.  Values and goals of care important to patient and family were attempted to be elicited.  Concept of Hospice and Palliative Care were discussed  Natural trajectory and expectations at EOL were discussed.  Questions and concerns addressed.   Family encouraged to call with questions or concerns.  PMT will continue to support holistically.      Goals of Care: 1.  Code Status:DNR/DNI   2. Scope of Treatment: 1.  Continue to treat the treatable.  Family is hopeful for improvement, however they understand the seriousness and reality of his medical situation and will make decisions dependant on outcomes.  4. Disposition:  Dependant on outcomes   3. Symptom  Management:   1. Pain:  Percocet 5-325 one tablet every 6 hrs prn- prescribed by attending 2.  Weakness:  PT/OT as appropriate  4. Psychosocial:  Emotional support offered to patient and his daughter at bedside.  Patient was able to speak to his daughter about his wishes and worries.  Both were grateful for this time together.  5. Spiritual:  Declined at this time      Brief HPI: 80 year old male found on floor by family. Confused, unable to get up. Admitted through ER and found to be hypotensive and in acute renal failure w/ profound metabolic derangements. PCCM asked to admit  Pt treated for PNA, hypovolemic shock, ARF with metabolic acidosis 19/41: PEA arrest (<30 sec) secondary to airway occlusion, asp PNA. An hour later, pt became hypoxic and hypotensive, possible ST changes (underlying LBBB), levophed started. Troponin 0.42, lactic acid level 1.8. 2D Echo 02/18 with EF 30-35%, + WMA, grade 1 DD.   02/19: Resp Cx + pseudomonas. ABX focused.  02/21: pt pulled out ET tube. He had been started on Lasix at 60 mg q 8 hr, but was increased later to 120 mg q 8 hr. Levophed d/c'd but restarted when pt became hypotensive.  02/22: 12 bt run NSVT noted Mg and K+ checked and supplemented PRN.  02/23: Levophed d/c'd  Weak and unstable    DEY:CXKGYJ to illicit secondary to weakness    PMH:  Past Medical History  Diagnosis Date  . Hypertension   . Diabetes mellitus   . Anemia   . Blood transfusion  hx of   . GERD (gastroesophageal reflux disease)   . Arthritis   . 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 Fracture     hx of right wrist fracture   . Fracture, ribs     hx of on right side   . Pneumothorax on right   . PVD (peripheral vascular disease)     Bilateral ICA stenosis 40-59% 2011  . Hyperlipidemia   . LBBB (left bundle branch block)      PSH: Past Surgical History  Procedure Laterality Date  . Hernia repair      right inguinal hernia repair   . Other  surgical history      undescended testicle surgery  . Cholecystectomy    . Hemorrhoid surgery    . Eye surgery      bilateral cataract surgery  . Knee arthroscopy      right knee   . Appendectomy    . Total knee arthroplasty  03/24/2012    Procedure: TOTAL KNEE ARTHROPLASTY;  Surgeon: Loanne Drilling, MD;  Location: [facility]S;  Service: Orthopedics;  Laterality: Right;   I have reviewed the FH and SH and  If appropriate update it with new information. No Known Allergies Scheduled Meds: . antiseptic oral rinse  15 mL Mouth Rinse QID  . chlorhexidine  15 mL Mouth Rinse BID  . ciprofloxacin  400 mg Intravenous Q12H  . collagenase   Topical Daily  . feeding supplement (PRO-STAT SUGAR FREE 64)  30 mL Per Tube BID  . furosemide  40 mg Intravenous Q12H  . insulin aspart  0-15 Units Subcutaneous 6 times per day  . [START ON 12/31/2013] potassium chloride  20 mEq Per Tube Daily   Continuous Infusions: . feeding supplement (GLUCERNA 1.2 CAL) 1,000 mL (12/30/13 1400)   PRN Meds:.morphine injection, oxyCODONE-acetaminophen    BP 106/59  Pulse 82  Temp(Src) 98.3 F (36.8 C) (Oral)  Resp 32  Ht 6' 0.83" (1.85 m)  Wt 79.9 kg (176 lb 2.4 oz)  BMI 23.35 kg/m2  SpO2 91%   PPS:30 % at best   Intake/Output Summary (Last 24 hours) at 12/30/13 1512 Last data filed at 12/30/13 1400  Gross per 24 hour  Intake 9183.5 ml  Output   3835 ml  Net 5348.5 ml    Physical Exam:  General: Chronically ill appearing, frail HEENT:  Dry buccal membranes, noted feeding tube, + temporal muscle wasting Chest:  Scattered coarse BS CVS: RRR Abdomen:soft NT +BS Ext: without edema Neuro: lethargic, follows commands  Labs: CBC    Component Value Date/Time   WBC 6.3 12/30/2013 0434   RBC 3.78* 12/30/2013 0434   HGB 10.9* 12/30/2013 0434   HCT 34.2* 12/30/2013 0434   PLT 181 12/30/2013 0434   MCV 90.5 12/30/2013 0434   MCH 28.8 12/30/2013 0434   MCHC 31.9 12/30/2013 0434   RDW 16.3* 12/30/2013 0434    LYMPHSABS 1.3 12/21/2013 2138   MONOABS 0.7 12/21/2013 2138   EOSABS 0.0 12/21/2013 2138   BASOSABS 0.0 12/21/2013 2138    BMET    Component Value Date/Time   NA 145 12/30/2013 0255   K 3.4* 12/30/2013 0255   CL 101 12/30/2013 0255   CO2 32 12/30/2013 0255   GLUCOSE 126* 12/30/2013 0255   BUN 39* 12/30/2013 0255   CREATININE 1.33 12/30/2013 0255   CALCIUM 8.0* 12/30/2013 0255   GFRNONAA 49* 12/30/2013 0255   GFRAA 57* 12/30/2013 0255  CMP     Component Value Date/Time   NA 145 12/30/2013 0255   K 3.4* 12/30/2013 0255   CL 101 12/30/2013 0255   CO2 32 12/30/2013 0255   GLUCOSE 126* 12/30/2013 0255   BUN 39* 12/30/2013 0255   CREATININE 1.33 12/30/2013 0255   CALCIUM 8.0* 12/30/2013 0255   PROT 6.4 12/21/2013 2138   ALBUMIN 2.6* 12/21/2013 2138   AST 31 12/21/2013 2138   ALT 28 12/21/2013 2138   ALKPHOS 51 12/21/2013 2138   BILITOT 1.0 12/21/2013 2138   GFRNONAA 49* 12/30/2013 0255   GFRAA 57* 12/30/2013 0255      Time In Time Out Total Time Spent with Patient Total Overall Time  1300 1415 70 min 75 min    Greater than 50%  of this time was spent counseling and coordinating care related to the above assessment and plan.  Discussed with Dr Marcellina Millin NP  Palliative Medicine Team Team Phone # [phone] Pager [phone]

## 2013-12-31 DIAGNOSIS — I428 Other cardiomyopathies: Secondary | ICD-10-CM

## 2013-12-31 DIAGNOSIS — R531 Weakness: Secondary | ICD-10-CM

## 2013-12-31 DIAGNOSIS — R0600 Dyspnea, unspecified: Secondary | ICD-10-CM

## 2013-12-31 DIAGNOSIS — R0609 Other forms of dyspnea: Secondary | ICD-10-CM

## 2013-12-31 DIAGNOSIS — R0989 Other specified symptoms and signs involving the circulatory and respiratory systems: Secondary | ICD-10-CM

## 2013-12-31 DIAGNOSIS — I420 Dilated cardiomyopathy: Secondary | ICD-10-CM

## 2013-12-31 DIAGNOSIS — Z515 Encounter for palliative care: Secondary | ICD-10-CM

## 2013-12-31 LAB — GLUCOSE, CAPILLARY
GLUCOSE-CAPILLARY: 132 mg/dL — AB (ref 70–99)
GLUCOSE-CAPILLARY: 133 mg/dL — AB (ref 70–99)
GLUCOSE-CAPILLARY: 142 mg/dL — AB (ref 70–99)
Glucose-Capillary: 125 mg/dL — ABNORMAL HIGH (ref 70–99)
Glucose-Capillary: 110 mg/dL — ABNORMAL HIGH (ref 70–99)

## 2013-12-31 LAB — BASIC METABOLIC PANEL
BUN: 48 mg/dL — AB (ref 6–23)
CO2: 33 meq/L — AB (ref 19–32)
CREATININE: 1.4 mg/dL — AB (ref 0.50–1.35)
Calcium: 8.7 mg/dL (ref 8.4–10.5)
Chloride: 101 mEq/L (ref 96–112)
GFR calc non Af Amer: 46 mL/min — ABNORMAL LOW (ref >90)
GFR, EST AFRICAN AMERICAN: 53 mL/min — AB (ref >90)
Glucose, Bld: 130 mg/dL — ABNORMAL HIGH (ref 70–99)
POTASSIUM: 4.3 meq/L (ref 3.7–5.3)
Sodium: 147 mEq/L (ref 137–147)

## 2013-12-31 LAB — MAGNESIUM: Magnesium: 2 mg/dL (ref 1.5–2.5)

## 2013-12-31 LAB — PHOSPHORUS: Phosphorus: 5.3 mg/dL — ABNORMAL HIGH (ref 2.3–4.6)

## 2013-12-31 MED ORDER — MORPHINE SULFATE 2 MG/ML IJ SOLN: 1.0000 mg | INTRAMUSCULAR | Status: DC | PRN

## 2013-12-31 MED ORDER — FUROSEMIDE 10 MG/ML IJ SOLN
40.0000 mg | Freq: Every day | INTRAMUSCULAR | Status: DC
  Administered 2013-12-31 – 2014-01-01 (×2): 40 mg via INTRAVENOUS
  Filled 2013-12-31 (×3): qty 4

## 2013-12-31 MED ORDER — MORPHINE SULFATE 2 MG/ML IJ SOLN: 0.5000 mg | INTRAMUSCULAR | Status: DC | PRN

## 2013-12-31 MED ORDER — FREE WATER
200.0000 mL | Freq: Four times a day (QID) | Status: DC
  Administered 2013-12-31 – 2014-01-01 (×5): 200 mL

## 2013-12-31 MED ORDER — MORPHINE SULFATE 2 MG/ML IJ SOLN
1.0000 mg | INTRAMUSCULAR | Status: DC | PRN
  Administered 2013-12-31 – 2014-01-01 (×7): 2 mg via INTRAVENOUS
  Filled 2013-12-31 (×8): qty 1

## 2013-12-31 MED ORDER — MORPHINE SULFATE 2 MG/ML IJ SOLN
1.0000 mg | INTRAMUSCULAR | Status: AC
  Administered 2013-12-31: 1 mg via INTRAVENOUS
  Filled 2013-12-31: qty 1

## 2013-12-31 MED ORDER — LORAZEPAM 2 MG/ML IJ SOLN
1.0000 mg | Freq: Three times a day (TID) | INTRAMUSCULAR | Status: DC | PRN
Start: 2013-12-31 — End: 2014-01-01
  Administered 2013-12-31: 1 mg via INTRAVENOUS
  Filled 2013-12-31: qty 1

## 2013-12-31 NOTE — Progress Notes (Signed)
Physical Therapy Treatment Patient Details Name: Rodert Hinch MRN: [id number] DOB: 01-25-1931 Today's Date: 12/31/2013 Time: 8299-3716 PT Time Calculation (min): 26 min  PT Assessment / Plan / Recommendation  History of Present Illness 80 year old male found on floor by family. Confused, unable to get up. In ER found to be hypotensive and in acute renal failure w/ profound metabolic derangements. VDRF 2/16-2/21 self-extubated   PT Comments   Pt with decreased interaction today from eval and noted resistance to bed mobility today. Pt able to tolerate increased standing trials and perform bil LE HEP today as well. RN educated for need for lift to return to bed. Will continue to follow.   Follow Up Recommendations  SNF;Supervision/Assistance - 24 hour     Does the patient have the potential to tolerate intense rehabilitation     Barriers to Discharge        Equipment Recommendations       Recommendations for Other Services    Frequency     Progress towards PT Goals Progress towards PT goals: Progressing toward goals (slowly limited by cognition)  Plan Current plan remains appropriate    Precautions / Restrictions Precautions Precautions: Fall Precaution Comments: panda, rectal tube, oxygen Restrictions Weight Bearing Restrictions: No   Pertinent Vitals/Pain No pain HR 97-128 sats 92% on 6L with drop to 84% end of session in chair with RN present, assessing and addressing respiratory status    Mobility  Bed Mobility Overal bed mobility: Needs Assistance;+ 2 for safety/equipment;+2 for physical assistance Bed Mobility: Rolling;Sidelying to Sit Rolling: Max assist;+2 for physical assistance;+2 for safety/equipment Sidelying to sit: Max assist;+2 for physical assistance;+2 for safety/equipment General bed mobility comments: cueing for sequence, safety, lines and mobility with assist to rotate pelvis and elevate trunk from surface pt with increased posterior lean and resistance  to transition to sitting today but once in sitting relaxed and able to sit without assist Transfers Overall transfer level: Needs assistance Equipment used: None Transfers: Sit to/from [facility] Sit to Stand: +2 safety/equipment;+2 physical assistance;Mod assist Stand pivot transfers: Mod assist;+2 safety/equipment;+2 physical assistance General transfer comment: Pt with assist to position bil LE prior to standing with cues for sequence and assist to stand from elevated surface. With pivot to chair control of pelvis with pad and max cueing. stood additional time from chair with bil Knees blocked, belt and bilUE support to stand for NT to perform pericare    Exercises General Exercises - Lower Extremity Ankle Circles/Pumps: AROM;Seated;Both;10 reps Heel Slides: Seated;Both;10 reps;AAROM   PT Diagnosis:    PT Problem List:   PT Treatment Interventions:     PT Goals (current goals can now be found in the care plan section)    Visit Information  Last PT Received On: 12/31/13 Assistance Needed: +2 History of Present Illness: 80 year old male found on floor by family. Confused, unable to get up. In ER found to be hypotensive and in acute renal failure w/ profound metabolic derangements. VDRF 2/16-2/21 self-extubated    Subjective Data      Cognition  Cognition Arousal/Alertness: Awake/alert Behavior During Therapy: Flat affect Overall Cognitive Status: Impaired/Different from baseline Area of Impairment: Orientation;Attention;Memory;Following commands;Safety/judgement;Awareness;Problem solving Orientation Level: Disoriented to;Time;Situation;Place Current Attention Level: Focused Memory: Decreased short-term memory;Decreased recall of precautions Following Commands: Follows one step commands with increased time Safety/Judgement: Decreased awareness of safety;Decreased awareness of deficits Problem Solving: Slow processing;Decreased initiation;Requires verbal  cues;Requires tactile cues General Comments: pt initially with tight fist on left hand  stating he was holding his bank deposit, once cues able to recognize no money and unclench fist    Balance  Balance Sitting balance-Leahy Scale: Fair Sitting balance - Comments: EOB 4 min with transition from mod to min guard  End of Session PT - End of Session Equipment Utilized During Treatment: Gait belt;Oxygen Activity Tolerance: Patient tolerated treatment well Patient left: in chair;with call bell/phone within reach;with restraints reapplied;with nursing/sitter in room Nurse Communication: Mobility status;Need for lift equipment;Precautions   GP     Lanetta Inch [facility] 12/31/2013, 11:08 AM Elwyn Reach, [place]

## 2013-12-31 NOTE — Progress Notes (Signed)
Name: Ronnald Shedden MRN: [id number] DOB: 12/28/1930    ADMISSION DATE:  12/21/2013 CONSULTATION DATE:  2/16  REFERRING MD :  [facility]  PRIMARY SERVICE: PCCM   CHIEF COMPLAINT:  Acute renal failure, resp failure  BRIEF PATIENT DESCRIPTION:  80 year old male found on floor by family. Confused, unable to get up. In ER found to be hypotensive and in acute renal failure w/ profound metabolic derangements. PCCM asked to admit   SIGNIFICANT EVENTS / STUDIES:  Lumbar spine 2/16: no acute fx Thoracic spine 2/16: no acute fx, old L1 compression fx w/ vertebroplasty  CT head 2/16: negative  Pelvis 2/16: neg  Renal US 2/17: no hydro or obstruction 2/17 - high pressor needs, acidotic 2/18 - Echo 30-35%, akinesis of distal anteroseptal myocardium, grade 1 diastolic dysfunction, PA pressure mildly-moderately increased 2/19- pressors improved 2/20 - pressors continue to improve 2/21 - off pressors 2/21 - self extubated, BP low with lasix and back on levophed, neg 2.8 liters 2/22 - continues to do well off vent 2/23 - rising pCO2 off vent 2/24 - panda tube placed  LINES / TUBES: 2/16 rt ij>>>2/25 2/16 ett>>>2/21 2/17 rt rad>>>2/21 2/24 panda NGT >>>  CULTURES: BCX 2 2/16>>> NGTD UC 2/16>>> neg Resp Cx>>>abundant pseudomonas, pan sensitive  ANTIBIOTICS: 2/16 unasyn>>>2/18 2/18 zosyn>>>2/20 2/20 cipro>>>plan 14 days 2/18 vanc>>>2/19 2/16 ceftriaxone>>>2/17  SUBJECTIVE: Improved mental status. Afebrile. Desaturated when moved oob by PT, on NRB Denies CP  VITAL SIGNS: Temp:  [97.5 F (36.4 C)-98.9 F (37.2 C)] 98.1 F (36.7 C) (02/26 0755) Pulse Rate:  [75-102] 91 (02/26 0900) Resp:  [15-35] 27 (02/26 0900) BP: (90-174)/(48-81) 128/48 mmHg (02/26 0800) SpO2:  [84 %-96 %] 84 % (02/26 0900) Weight:  [79.9 kg (176 lb 2.4 oz)] 79.9 kg (176 lb 2.4 oz) (02/26 0400) HEMODYNAMICS:   VENTILATOR SETTINGS:   INTAKE / OUTPUT: Intake/Output     02/25 0701 - 02/26 0700  02/26 0701 - 02/27 0700   I.V. (mL/kg)     NG/GT 1505 130   IV Piggyback 400    Total Intake(mL/kg) 1905 (23.8) 130 (1.6)   Urine (mL/kg/hr) 2100 (1.1) 650 (2.9)   Stool 400 (0.2)    Total Output 2500 650   Net -595 -520          PHYSICAL EXAMINATION: General:  Weak, frail elderly male Neuro:  Alert, oriented x3 HEENT:  On NRB, no jvd CV: rrr, no murmur appreciated Lungs:  CTAB Abdomen:  Non-tender + bowel sounds Musculoskeletal: low muscle mass  Skin:  Bilateral toes with minimal purple discoloration, toes warm, skin on left arm covered with pink pad  LABS:  CBC  Recent Labs Lab 12/28/13 0400 12/29/13 0621 12/30/13 0434  WBC 10.4 7.7 6.3  HGB 11.1* 11.3* 10.9*  HCT 34.6* 34.7* 34.2*  PLT 130* 157 181   Coag's No results found for this basename: APTT, INR,  in the last 168 hours BMET  Recent Labs Lab 12/29/13 1904 12/30/13 0255 12/31/13 0648  NA 146 145 147  K 3.5* 3.4* 4.3  CL 102 101 101  CO2 31 32 33*  BUN 38* 39* 48*  CREATININE 1.35 1.33 1.40*  GLUCOSE 109* 126* 130*   Electrolytes  Recent Labs Lab 12/29/13 1904 12/30/13 0255 12/30/13 0604 12/30/13 1945 12/31/13 0648  CALCIUM 8.1* 8.0*  --   --  8.7  MG 2.1  --  1.9 2.0 2.0  PHOS 4.4  --  4.9* 4.8* 5.3*   Sepsis  Markers No results found for this basename: LATICACIDVEN, PROCALCITON, O2SATVEN,  in the last 168 hours ABG  Recent Labs Lab 12/26/13 0330 12/26/13 2110 12/28/13 0516  PHART 7.427 7.436 7.483*  PCO2ART 41.4 40.3 58.9*  PO2ART 60.8* 117.0* 68.0*   Liver Enzymes No results found for this basename: AST, ALT, ALKPHOS, BILITOT, ALBUMIN,  in the last 168 hours Cardiac Enzymes  Recent Labs Lab 12/27/13 0815 12/27/13 1540 12/27/13 2015  TROPONINI <0.30 <0.30 <0.30   Glucose  Recent Labs Lab 12/30/13 1119 12/30/13 1552 12/30/13 1926 12/30/13 2339 12/31/13 0415 12/31/13 0719  GLUCAP 117* 115* 120* 132* 142* 133*    Imaging Dg Abd 1 View  12/29/2013   CLINICAL  DATA:  Pain the feeding tube positioning.  EXAM: ABDOMEN - 1 VIEW  COMPARISON:  US RENAL dated 12/22/2013; DG LUMBAR SPINE 2-3 VIEWS dated 12/21/2013  FINDINGS: A scanned in paper fluoroscopic spot image demonstrates a Panda feeding tube with its tip in the fourth portion of the duodenum, and contrast medium in the duodenum.  IMPRESSION: 1. Panda feeding tube tip: Fourth portion of the duodenum.   Electronically Signed   By: Sherryl Barters M.D.   On: 12/29/2013 17:04   Dg Chest Port 1 View  12/30/2013   CLINICAL DATA:  Respiratory failure.  EXAM: PORTABLE CHEST - 1 VIEW  COMPARISON:  DG CHEST 1V PORT dated 12/29/2013; DG CHEST 1V PORT dated 12/28/2013  FINDINGS: Right IJ line in stable position. Orogastric tube interim placement. Tip below the left hemidiaphragm. Persistent cardiomegaly and bilateral pulmonary alveolar infiltrates. Again congestive heart failure wake pulmonary edema, ARDS, and bilateral pneumonia could present in this fashion. Small pleural effusions cannot be excluded. No pneumothorax. No acute osseous abnormality.  IMPRESSION: 1. Interim placement of orogastric tube, its tip is below the left hemidiaphragm. Right IJ line in stable position. 2. Cardiomegaly with bilateral pulmonary infiltrates unchanged. Again congestive heart failure pulmonary edema, ARDS, bilateral pneumonia could present in this fashion. 3. Small pleural effusions cannot be excluded.   Electronically Signed   By: Marcello Moores  Register   On: 12/30/2013 07:27   Dg [place] Dys Tube Plc W/fl-no Rad  12/29/2013   CLINICAL DATA: need panda tube placed - failed attempts in the ICU   NASO G TUBE PLACEMENT WITH FLUORO  Fluoroscopy was utilized by the requesting physician.  No radiographic  interpretation.      CXR: slight improvement in infiltrates, still with right pleural effusion  ASSESSMENT / PLAN:  PULMONARY A: Acute resp failure - improved Pseudomonas PNA ARDS -resolved Edema component P:   O2 supplemental to keep sats  >90%, wean O2 as able to [place] pcxr tomorrow am Antibiotics per above DNR  CARDIOVASCULAR A: Hypovolemia, shock, septic - improved No REL AI Mild at best stress ischemia Echo 30-35%, akinesis of distal anteroseptal myocardium, grade 1 diastolic dysfunction, PA pressure mildly-moderately increased P:  Appreciate cards input - cath only if he improves enough ? Hydralazine/ nitrates, high risk for ACE  RENAL A:   Acute renal failure - slight worsening with lasix hypernatremia - improved Volume overload Hypokalemia  P:   BMET daily, mag and phos daily Free water  GASTROINTESTINAL A:  Dysphagia Protein calorie malnutrition FOBT +, no gross blood P:    SLP for swallow eval -  Protonix Tube feeds started  HEMATOLOGIC A: Anemia - likely dilutional - stable P:  Trend CBC  SCDs with + FOBT -outpt work up PPI  INFECTIOUS A:  aspiration, decub / arm  wound Procalcitonin 0.20 Pseudomonal PNA P:   Wound care cipro to stop date - 14 day course   ENDOCRINE A:  H/O DM  TSH 0.816 - WNL No relative AI P:   SSI - restart low dose lantus if cbgs > 150 cbgs  NEUROLOGIC A:  acute encephalopathy, agitation - improving Delirium  P:   Frequent re-orientation To chair PT - SNF or 24 hr supervision  DERM A: sacral pressure ulcer: unstageable      Pressure ulcer right arm: unstageable  P: WOC consult appreciated   TODAY'S SUMMARY: Based on d/w daughter, requested palliative care consult for goals of care from here on, I am hopeful his swallowing will improve, but unsure how much physical mobility he will regain. Daughter clear that he would not want long term SNF placement but ok with short term rehab   Care during the described time interval was provided by me and/or other providers on the critical care team.  I have reviewed this patient's available data, including medical history, events of note, physical examination and test results as part of my evaluation  CC time x  30m   Phillis Thackeray V.

## 2013-12-31 NOTE — Progress Notes (Signed)
Patient Name: Shane Pope Date of Encounter: 12/31/2013   Principal Problem:   Acute respiratory failure Active Problems:   GI bleed   DIABETES MELLITUS, TYPE II   Acute renal failure   Altered mental status   Malnutrition of moderate degree   Congestive dilated cardiomyopathy   HYPERLIPIDEMIA   Hypertension   COPD (chronic obstructive pulmonary disease)   Palliative care encounter   Weakness generalized   SUBJECTIVE  No c/p.  Still dyspneic.  C/o back pain and would like to get back into bed.  CURRENT MEDS . antiseptic oral rinse  15 mL Mouth Rinse QID  . chlorhexidine  15 mL Mouth Rinse BID  . ciprofloxacin  400 mg Intravenous Q12H  . collagenase   Topical Daily  . feeding supplement (PRO-STAT SUGAR FREE 64)  30 mL Per Tube BID  . free water  200 mL Per Tube Q6H  . furosemide  40 mg Intravenous Q breakfast  . insulin aspart  0-15 Units Subcutaneous 6 times per day  . potassium chloride  20 mEq Per Tube Daily    OBJECTIVE  Filed Vitals:   12/31/13 0800 12/31/13 0900 12/31/13 1000 12/31/13 1100  BP: 128/48  119/58   Pulse: 85 91 98 103  Temp:      TempSrc:      Resp: 24 27 30  32  Height:      Weight:      SpO2: 92% 84% 89% 92%    Intake/Output Summary (Last 24 hours) at 12/31/13 1213 Last data filed at 12/31/13 1113  Gross per 24 hour  Intake   2045 ml  Output   3000 ml  Net   -955 ml   Filed Weights   12/29/13 0353 12/30/13 0500 12/31/13 0400  Weight: 176 lb 9.4 oz (80.1 kg) 176 lb 2.4 oz (79.9 kg) 176 lb 2.4 oz (79.9 kg)    PHYSICAL EXAM  General: Pleasant.  Intermittently reports back pain/winces. Neuro: Disoriented to time/place. Moves all extremities spontaneously.  Follows commands. Psych: Flat affect. HEENT:  Normal  Neck: Supple without bruits or JVD. Lungs:  Resp regular and unlabored, diminished breath sounds bilat with scatt rhonchi, occas insp and/or exp wheeze. Heart: RRR no s3, s4, or murmurs. Abdomen: Soft, non-tender,  non-distended, BS + x 4.  Extremities: No clubbing, cyanosis or edema. DP/PT/Radials 2+ and equal bilaterally.  Accessory Clinical Findings  CBC  Recent Labs  12/29/13 0621 12/30/13 0434  WBC 7.7 6.3  HGB 11.3* 10.9*  HCT 34.7* 34.2*  MCV 90.1 90.5  PLT 157 628   Basic Metabolic Panel  Recent Labs  12/30/13 0255  12/30/13 1945 12/31/13 0648  NA 145  --   --  147  K 3.4*  --   --  4.3  CL 101  --   --  101  CO2 32  --   --  33*  GLUCOSE 126*  --   --  130*  BUN 39*  --   --  48*  CREATININE 1.33  --   --  1.40*  CALCIUM 8.0*  --   --  8.7  MG  --   < > 2.0 2.0  PHOS  --   < > 4.8* 5.3*  < > = values in this interval not displayed.  TELE  rsr to sinus tach, occas pvc's.  Radiology/Studies  Dg Chest Port 1 View  12/30/2013   CLINICAL DATA:  Respiratory failure.  EXAM: PORTABLE CHEST - 1 VIEW  COMPARISON:  DG CHEST 1V PORT dated 12/29/2013; DG CHEST 1V PORT dated 12/28/2013  FINDINGS: Right IJ line in stable position. Orogastric tube interim placement. Tip below the left hemidiaphragm. Persistent cardiomegaly and bilateral pulmonary alveolar infiltrates. Again congestive heart failure wake pulmonary edema, ARDS, and bilateral pneumonia could present in this fashion. Small pleural effusions cannot be excluded. No pneumothorax. No acute osseous abnormality.  IMPRESSION: 1. Interim placement of orogastric tube, its tip is below the left hemidiaphragm. Right IJ line in stable position. 2. Cardiomegaly with bilateral pulmonary infiltrates unchanged. Again congestive heart failure pulmonary edema, ARDS, bilateral pneumonia could present in this fashion. 3. Small pleural effusions cannot be excluded.   Electronically Signed   By: Marcello Moores  Register   On: 12/30/2013 07:27   2D Echocardiogram 2.18.2015  Study Conclusions  - Left ventricle: The cavity size was normal. Systolic   function was moderately to severely reduced. The estimated   ejection fraction was in the range of 30% to  35%. There is   akinesis of the distalanteroseptal myocardium. Doppler   parameters are consistent with abnormal left ventricular   relaxation (grade 1 diastolic dysfunction). - Left atrium: The atrium was mildly dilated. - Right ventricle: The cavity size was mildly dilated. Wall   thickness was normal. - Right atrium: The atrium was mildly dilated. - Pulmonary arteries: Systolic pressure was mildly to   moderately increased. PA peak pressure: 39mm Hg (S).  ASSESSMENT AND PLAN  1.  Acute resp failure/Pseudomonas PNA/ARDS:  abx per pulm.    2.  Acute systolic CHF:  Ef 43-15%.  He is up a total of 19L for this admission and -595 overnight.  That said, wt is down from 196 on 2/21 to 176 this morning.  If accurate, he was 165 on admission.  He is currently euvolemic on exam.  BUN/Creat/Bicarb up slightly.  Agree with reducing lasix to 40mg  daily.  He is not currently on a bb in setting of acute resp failure.  No acei/arb 2/2 mild renal insuff.  Could consider hydralazine and nitrate once he's a little more stable.  I worry that he may be prone to hypotension with hydralazine.  Will consider cath only if he has significant improvement.    Signed, Murray Hodgkins NP

## 2013-12-31 NOTE — Progress Notes (Signed)
Given age and co-morbidities, may be wise to consider a more palliative approach. There will be no cath or PCI option in the short/intermediate future ( probably never).

## 2013-12-31 NOTE — Clinical Social Work Psychosocial (Signed)
Clinical Social Work Department BRIEF PSYCHOSOCIAL ASSESSMENT 12/31/2013  Patient:  Shane Pope, Shane Pope     Account Number:  [id number]     Admit date:  12/21/2013  Clinical Social Worker:  Wylene Men  Date/Time:  12/30/2013 10:52 AM  Referred by:  Care Management  Date Referred:  12/30/2013 Referred for  SNF Placement   Other Referral:   none   Interview type:  Other - See comment Other interview type:   patient daughter Shane Pope    PSYCHOSOCIAL DATA Living Status:  ALONE Admitted from facility:   Level of care:   Primary support name:  Shane Pope Primary support relationship to patient:  CHILD, ADULT Degree of support available:   adequate    CURRENT CONCERNS Current Concerns  Post-Acute Placement   Other Concerns:   none    SOCIAL WORK ASSESSMENT / PLAN Pt is alert and oriented x1.  CSW spoke with daughter, Shane Pope at length regarding possible disposition and recommendation from PT.  Reportedly, pt is from home where he was living indepently prior to admission.  Pt is deconditioned to the point of hoyer lift while in the ICU. Pt insurance does not currenly cover LTACH and PT is recommending SNF.  Pt has been to [facility] in the past and this remains the daughter's first choice.  [place] is near to the daughter's home.    Pt insurance is [employer] which requires an authorization.  CSW faxed clinicals to [facility].  The rep is Velva Harman and can be reached at [phone].    Daughter is thankful that pt is able to have his skilled needs met after dc, but is anxious regarding prognosis of future health.  Daughter also expressed concers regarding when the pt is dc home from SNF.  Daughter was referred to [employer] a resource for answered questions. Pt daughter was also encouarged to apply for Medicaid for pt. for possible future skiilled needs.   Assessment/plan status:  Psychosocial Support/Ongoing Assessment of Needs Other assessment/ plan:    Daughter's first choice for SNF is [facility]   Information/referral to community resources:   [employer]  Medicaid  SNF    PATIENT'S/FAMILY'S RESPONSE TO PLAN OF CARE: Daughter was thankful for the resources and naviagtion education received from [facility].  Daughter is agreeable to PT recommendations.       Nonnie Done, [place] [phone]  Clinical Social Work

## 2013-12-31 NOTE — Progress Notes (Signed)
Speech Language Pathology Treatment: Dysphagia  Patient Details Name: Shane Pope MRN: [id number] DOB: Jul 20, 1931 Today's Date: 12/31/2013 Time: 9449-6759 SLP Time Calculation (min): 12 min  Assessment / Plan / Recommendation Clinical Impression  Pt seen for PO trials given ongoing NPO status with Panda in place. Pt continues to have high respiratory need. Upon arrival he has a nasal cannula and a Venti mask. Pt has open mouth breathing, trying to talk without actually making full articulatory contacts. Pts oral movement improved after oral care, but minimal trials of ice chips still resulted in insufficient oral manipulation. In 50% of trials pt did not actually transit ice chip or initiate a swallow, though he believed he did swallow. There was evidence of penetration with wet vocal quality and late cough/throat clear. Pt is not able to manage POs or protect airway, primarily due to respiratory compromise and need for mouth breathing. If a comfort approach is initiated, would recommend starting ice chips today and progressing to POs if possible. If pt/family want to avoid aspiration pt should remain NPO with f/u from SLP tomorrow. Pt may need MBS if respiratory function improves.     HPI HPI: 80 year old male with PMH of GERD, COPD, anemia, living independently, found on floor by family 2/16. Confused, unable to get up. In ER found to be hypotensive and in acute renal failure w/ profound metabolic derangements. Intubated 2/16, self extubated 2/21.    Pertinent Vitals NA  SLP Plan  Continue with current plan of care    Recommendations Diet recommendations: Other(comment) (Comfort feeds)              Oral Care Recommendations: Oral care Q4 per protocol Follow up Recommendations: 24 hour supervision/assistance Plan: Continue with current plan of care    GO    [facility], MA CCC-SLP [phone]  Lynann Beaver 12/31/2013, 2:30 PM

## 2013-12-31 NOTE — Clinical Social Work Placement (Signed)
Clinical Social Work Department CLINICAL SOCIAL WORK PLACEMENT NOTE 12/31/2013  Patient:  ROLDAN, LAFOREST  Account Number:  [id number] [place] date:  12/21/2013  Clinical Social Worker:  Wylene Men  Date/time:  12/30/2013 11:04 AM  Clinical Social Work is seeking post-discharge placement for this patient at the following level of care:   [place]   (*CSW will update this form in Epic as items are completed)   12/30/2013  Patient/family provided with [place] Department of Clinical Social Work's list of facilities offering this level of care within the geographic area requested by the patient (or if unable, by the patient's family).  12/30/2013  Patient/family informed of their freedom to choose among providers that offer the needed level of care, that participate in Medicare, Medicaid or managed care program needed by the patient, have an available bed and are willing to accept the patient.  12/30/2013  Patient/family informed of [facility]' ownership interest in [facility], as well as of the fact that they are under no obligation to receive care at this facility.  PASARR submitted to EDS on  PASARR number received from [place] on   FL2 transmitted to all facilities in geographic area requested by pt/family on   FL2 transmitted to all facilities within larger geographic area on   Patient informed that his/her managed care company has contracts with or will negotiate with  certain facilities, including the following:     Patient/family informed of bed offers received:   Patient chooses bed at  Physician recommends and patient chooses bed at    Patient to be transferred to  on   Patient to be transferred to facility by   The following physician request were entered in Epic:   Additional Comments:

## 2013-12-31 NOTE — Progress Notes (Signed)
Progress Note from the Palliative Medicine Team at [facility]:  patient  Was OOB to chair, uncomfortable  (1200)  -daughter upset seeing him "so uncomfortable"--continued conversation regarding [place] an options  -Dr Tamala Julian spoke with daughter at bedside  -call placed to Dr Elsworth Soho and he spoke with daughter by telephone     -plan is to continue treating but to keep comfort the priority, Magda Paganini says the doctor told her her Dad  "has a small chance of return to baseline, so let's give him a chance"     -low dose Morphine every 2 hrs ordered  -continued emotional support , chaplain services declined   Returned to check on patient and family (1500)  -daughter is tearful and upset,  "look at him, do something", I discussed with her that I believe his time is limited and I spoke to  the importance of clarifying goals of care in order to maximize symptom management   -call placed to Dr Lamonte Sakai, discussed current situation, daughter expresses comfort as a priority      -increase Morphine to 1-2 mg every hr for comfort  Objective: No Known Allergies Scheduled Meds: . antiseptic oral rinse  15 mL Mouth Rinse QID  . chlorhexidine  15 mL Mouth Rinse BID  . ciprofloxacin  400 mg Intravenous Q12H  . collagenase   Topical Daily  . feeding supplement (PRO-STAT SUGAR FREE 64)  30 mL Per Tube BID  . free water  200 mL Per Tube Q6H  . furosemide  40 mg Intravenous Q breakfast  . insulin aspart  0-15 Units Subcutaneous 6 times per day  . potassium chloride  20 mEq Per Tube Daily   Continuous Infusions: . feeding supplement (GLUCERNA 1.2 CAL) 1,000 mL (12/31/13 1001)   PRN Meds:.morphine injection, oxyCODONE-acetaminophen  BP 112/57  Pulse 102  Temp(Src) 98.4 F (36.9 C) (Oral)  Resp 21  Ht 6' 0.83" (1.85 m)  Wt 79.9 kg (176 lb 2.4 oz)  BMI 23.35 kg/m2  SpO2 91%   PPS:20 %  Pain Score:denies    Intake/Output Summary (Last 24 hours) at 12/31/13 1318 Last data filed at 12/31/13  1222  Gross per 24 hour  Intake   1805 ml  Output   4000 ml  Net  -2195 ml        Physical Exam:  General:  ill appearing, dyspneic, dusky in color  HEENT:  Dry buccal membranes, audible throat secretions Chest:   Decreased in bases, scattered coarse BS CVS: tachycrdic Abdomen:soft NT +BS Ext:  Without edema Neuro: lethargic/weak, intermittently confused  Labs: CBC    Component Value Date/Time   WBC 6.3 12/30/2013 0434   RBC 3.78* 12/30/2013 0434   HGB 10.9* 12/30/2013 0434   HCT 34.2* 12/30/2013 0434   PLT 181 12/30/2013 0434   MCV 90.5 12/30/2013 0434   MCH 28.8 12/30/2013 0434   MCHC 31.9 12/30/2013 0434   RDW 16.3* 12/30/2013 0434   LYMPHSABS 1.3 12/21/2013 2138   MONOABS 0.7 12/21/2013 2138   EOSABS 0.0 12/21/2013 2138   BASOSABS 0.0 12/21/2013 2138    BMET    Component Value Date/Time   NA 147 12/31/2013 0648   K 4.3 12/31/2013 0648   CL 101 12/31/2013 0648   CO2 33* 12/31/2013 0648   GLUCOSE 130* 12/31/2013 0648   BUN 48* 12/31/2013 0648   CREATININE 1.40* 12/31/2013 0648   CALCIUM 8.7 12/31/2013 0648   GFRNONAA 46* 12/31/2013 0648   GFRAA 53* 12/31/2013 9147  CMP     Component Value Date/Time   NA 147 12/31/2013 0648   K 4.3 12/31/2013 0648   CL 101 12/31/2013 0648   CO2 33* 12/31/2013 0648   GLUCOSE 130* 12/31/2013 0648   BUN 48* 12/31/2013 0648   CREATININE 1.40* 12/31/2013 0648   CALCIUM 8.7 12/31/2013 0648   PROT 6.4 12/21/2013 2138   ALBUMIN 2.6* 12/21/2013 2138   AST 31 12/21/2013 2138   ALT 28 12/21/2013 2138   ALKPHOS 51 12/21/2013 2138   BILITOT 1.0 12/21/2013 2138   GFRNONAA 46* 12/31/2013 0648   GFRAA 53* 12/31/2013 0648      Assessment and Plan: 1. Code Status: DNR/DNI 2. Symptom Control:          Dyspnea:Morphine 102 mg every 1 hr prn           Anxiety: Ativan 1 mg every 8 hrs prn 3. Psycho/Social:  Emotional support offered to daughter at bedside, she is having a very difficult time making a decision regarding continuation of medical interventions vs  full comfort.  She clearly verbalizes if he cannot get back to living in his own apartment that I don't want to keep doing this.  I allowed space for her to express her thoughts,  feelings and fears.  We spoke to the fact that no one can ever tell her 100% anything.  She is encouraged to pray and  trust in what she believes is her father's best interest. 4. Spiritual  Declined spiritual care 5. Disposition:  Dependant on outcomes   Patient Documents Completed or Given: Document Given Completed  Advanced Directives Pkt    MOST    DNR    Gone from My Sight    Hard Choices yes     Time In Time Out Total Time Spent with Patient Total Overall Time  1200 1330  80 min 90 min    Greater than 50%  of this time was spent counseling and coordinating care related to the above assessment and plan.  Wadie Lessen NP  Palliative Medicine Team Team Phone # [phone] Pager [phone]  Discussed with Dr Elsworth Soho and Dr Tamala Julian  PMT will continue to support holistically 1

## 2014-01-01 DIAGNOSIS — I5043 Acute on chronic combined systolic (congestive) and diastolic (congestive) heart failure: Secondary | ICD-10-CM | POA: Diagnosis present

## 2014-01-01 LAB — GLUCOSE, CAPILLARY
GLUCOSE-CAPILLARY: 109 mg/dL — AB (ref 70–99)
Glucose-Capillary: 114 mg/dL — ABNORMAL HIGH (ref 70–99)
Glucose-Capillary: 115 mg/dL — ABNORMAL HIGH (ref 70–99)
Glucose-Capillary: 125 mg/dL — ABNORMAL HIGH (ref 70–99)
Glucose-Capillary: 135 mg/dL — ABNORMAL HIGH (ref 70–99)
Glucose-Capillary: 136 mg/dL — ABNORMAL HIGH (ref 70–99)
Glucose-Capillary: 138 mg/dL — ABNORMAL HIGH (ref 70–99)
Glucose-Capillary: 140 mg/dL — ABNORMAL HIGH (ref 70–99)

## 2014-01-01 LAB — BASIC METABOLIC PANEL
BUN: 54 mg/dL — ABNORMAL HIGH (ref 6–23)
CALCIUM: 8.5 mg/dL (ref 8.4–10.5)
CO2: 33 mEq/L — ABNORMAL HIGH (ref 19–32)
CREATININE: 1.35 mg/dL (ref 0.50–1.35)
Chloride: 102 mEq/L (ref 96–112)
GFR calc Af Amer: 56 mL/min — ABNORMAL LOW (ref >90)
GFR calc non Af Amer: 48 mL/min — ABNORMAL LOW (ref >90)
GLUCOSE: 123 mg/dL — AB (ref 70–99)
Potassium: 4.2 mEq/L (ref 3.7–5.3)
Sodium: 145 mEq/L (ref 137–147)

## 2014-01-01 LAB — CBC
HEMATOCRIT: 35.1 % — AB (ref 39.0–52.0)
HEMOGLOBIN: 11.1 g/dL — AB (ref 13.0–17.0)
MCH: 28.8 pg (ref 26.0–34.0)
MCHC: 31.6 g/dL (ref 30.0–36.0)
MCV: 91.2 fL (ref 78.0–100.0)
Platelets: 232 10*3/uL (ref 150–400)
RBC: 3.85 MIL/uL — ABNORMAL LOW (ref 4.22–5.81)
RDW: 16.2 % — ABNORMAL HIGH (ref 11.5–15.5)
WBC: 7.2 10*3/uL (ref 4.0–10.5)

## 2014-01-01 LAB — MAGNESIUM: MAGNESIUM: 2.1 mg/dL (ref 1.5–2.5)

## 2014-01-01 LAB — PHOSPHORUS: Phosphorus: 5.2 mg/dL — ABNORMAL HIGH (ref 2.3–4.6)

## 2014-01-01 MED ORDER — LORAZEPAM 2 MG/ML IJ SOLN: 1.0000 mg | Freq: Four times a day (QID) | INTRAMUSCULAR | Status: DC | PRN

## 2014-01-01 MED ORDER — FUROSEMIDE 10 MG/ML IJ SOLN
40.0000 mg | Freq: Two times a day (BID) | INTRAMUSCULAR | Status: DC
  Administered 2014-01-01: 40 mg via INTRAVENOUS
  Filled 2014-01-01 (×3): qty 4

## 2014-01-01 MED ORDER — MORPHINE SULFATE 10 MG/ML IJ SOLN
1.0000 mg/h | INTRAVENOUS | Status: DC
  Filled 2014-01-01: qty 10

## 2014-01-01 MED ORDER — RESOURCE THICKENUP CLEAR PO POWD
ORAL | Status: DC | PRN
  Filled 2014-01-01: qty 125

## 2014-01-01 MED ORDER — MORPHINE SULFATE 10 MG/ML IJ SOLN: 1.0000 mg/h | INTRAVENOUS | Status: DC

## 2014-01-01 MED ORDER — ALBUTEROL SULFATE (2.5 MG/3ML) 0.083% IN NEBU
2.5000 mg | INHALATION_SOLUTION | Freq: Four times a day (QID) | RESPIRATORY_TRACT | Status: DC
Start: 2014-01-01 — End: 2014-01-03
  Administered 2014-01-01 – 2014-01-03 (×10): 2.5 mg via RESPIRATORY_TRACT
  Filled 2014-01-01 (×10): qty 3

## 2014-01-01 MED ORDER — IPRATROPIUM BROMIDE 0.02 % IN SOLN
0.5000 mg | Freq: Four times a day (QID) | RESPIRATORY_TRACT | Status: DC
  Administered 2014-01-01 – 2014-01-03 (×10): 0.5 mg via RESPIRATORY_TRACT
  Filled 2014-01-01 (×10): qty 2.5

## 2014-01-01 MED ORDER — SODIUM CHLORIDE 0.9 % IV SOLN
1.0000 mg/h | INTRAVENOUS | Status: DC
  Administered 2014-01-01: 1 mg/h via INTRAVENOUS
  Filled 2014-01-01: qty 10

## 2014-01-01 NOTE — Progress Notes (Signed)
Subjective: He appears uncomfortable.  Pain from compression fracture according to daughter.  Objective: Vital signs in last 24 hours: Temp:  [97 F (36.1 C)-98.5 F (36.9 C)] 97 F (36.1 C) (02/27 0935) Pulse Rate:  [74-102] 89 (02/27 0935) Resp:  [20-31] 24 (02/27 0935) BP: (93-125)/(43-62) 101/43 mmHg (02/27 0935) SpO2:  [90 %-97 %] 90 % (02/27 0935) FiO2 (%):  [50 %] 50 % (02/26 1221) Weight:  [176 lb 5.9 oz (80 kg)] 176 lb 5.9 oz (80 kg) (02/27 0400) Last BM Date: 01/01/14  Intake/Output from previous day: 02/26 0701 - 02/27 0700 In: 2295 [NG/GT:1895; IV Piggyback:400] Out: 3375 [Urine:3375] Intake/Output this shift: Total I/O In: -  Out: 600 [Urine:600]  Medications Current Facility-Administered Medications  Medication Dose Route Frequency Provider Last Rate Last Dose  . albuterol (PROVENTIL) (2.5 MG/3ML) 0.083% nebulizer solution 2.5 mg  2.5 mg Nebulization Q6H Marton Redwood, MD   2.5 mg at 01/01/14 0903  . antiseptic oral rinse (BIOTENE) solution 15 mL  15 mL Mouth Rinse QID Rush Farmer, MD   15 mL at 01/01/14 0400  . chlorhexidine (PERIDEX) 0.12 % solution 15 mL  15 mL Mouth Rinse BID Rush Farmer, MD   15 mL at 12/31/13 1931  . ciprofloxacin (CIPRO) IVPB 400 mg  400 mg Intravenous Q12H Lauren Bajbus, RPH   400 mg at 01/01/14 0009  . collagenase (SANTYL) ointment   Topical Daily Rush Farmer, MD   1 application at 32/20/25 1000  . feeding supplement (GLUCERNA 1.2 CAL) liquid 1,000 mL  1,000 mL Per Tube Continuous Dalene Carrow, RD 65 mL/hr at 01/01/14 0508 1,000 mL at 01/01/14 0508  . feeding supplement (PRO-STAT SUGAR FREE 64) liquid 30 mL  30 mL Per Tube BID Renee A Kuneff, DO   30 mL at 01/01/14 0928  . free water 200 mL  200 mL Per Tube Q6H Rigoberto Noel, MD   200 mL at 01/01/14 1000  . furosemide (LASIX) injection 40 mg  40 mg Intravenous Q breakfast Rigoberto Noel, MD   40 mg at 01/01/14 0615  . insulin aspart (novoLOG) injection 0-15 Units   0-15 Units Subcutaneous 6 times per day Ma Hillock, DO   2 Units at 01/01/14 4270  . ipratropium (ATROVENT) nebulizer solution 0.5 mg  0.5 mg Nebulization Q6H Marton Redwood, MD   0.5 mg at 01/01/14 6237  . LORazepam (ATIVAN) injection 1 mg  1 mg Intravenous Q8H PRN Knox Royalty, NP   1 mg at 12/31/13 1338  . morphine 2 MG/ML injection 1-2 mg  1-2 mg Intravenous Q1H PRN Knox Royalty, NP   2 mg at 01/01/14 0017  . oxyCODONE-acetaminophen (PERCOCET/ROXICET) 5-325 MG per tablet 1 tablet  1 tablet Oral Q6H PRN Rigoberto Noel, MD   1 tablet at 12/30/13 1453  . potassium chloride 20 MEQ/15ML (10%) liquid 20 mEq  20 mEq Per Tube Daily Rigoberto Noel, MD   20 mEq at 01/01/14 6283  . RESOURCE THICKENUP CLEAR   Oral PRN Marton Redwood, MD        PE: General appearance: alert, cooperative, moderate distress and Appears in pain Lungs: +rhonchi Heart: regular rate and rhythm, S1, S2 normal, no murmur, click, rub or gallop Abdomen: BS nontender Extremities: no LEE, 1+ Upper ext edema Pulses: 2+ and symmetric Skin: Warm and dry. Neurologic: Grossly normal  Lab Results:   Recent Labs  12/30/13 0434 01/01/14 0215  WBC 6.3 7.2  HGB 10.9* 11.1*  HCT 34.2* 35.1*  PLT 181 232   BMET  Recent Labs  12/30/13 0255 12/31/13 0648 01/01/14 0215  NA 145 147 145  K 3.4* 4.3 4.2  CL 101 101 102  CO2 32 33* 33*  GLUCOSE 126* 130* 123*  BUN 39* 48* 54*  CREATININE 1.33 1.40* 1.35  CALCIUM 8.0* 8.7 8.5    Assessment/Plan   Principal Problem:   Acute respiratory failure Active Problems:   Acute on chronic combined systolic and diastolic congestive heart failure   DIABETES MELLITUS, TYPE II   HYPERLIPIDEMIA   Hypertension   COPD (chronic obstructive pulmonary disease)   Acute renal failure   Altered mental status   GI bleed   Malnutrition of moderate degree   Congestive dilated cardiomyopathy   Palliative care encounter   Weakness generalized   Dyspnea  Plan:  Acute respiratory failure  with PNA.  CHF: Net fluids: -1.0L/+18.5 on IV lasix 40mg  daily.  EF 30-35%, akinesis of the distalanteroseptal myocardium, grade one diastolic dysfunction.  No plan for ischemic work-up.  Mildly hypotensive(97/43-111/62). HR stable.  Palliative medicine following    LOS: 11 days    Jandy Brackens 01/01/2014 11:44 AM

## 2014-01-01 NOTE — Progress Notes (Signed)
Discussed with patients daughter-  I had spoken earlier today with Wadie Lessen- he has had increasing respiratory distress throughout the day.  Started on morphine hourly with some improvement.  I discussed goals with his daughter.  He does not want him to suffer.  She is having difficulty deciding whether she wants to withdraw care completely and move towards comfort care or continue supportive therapy.  Regardless, she does desire continuous morphine to maintain his comfort.  She is worried that he may plateau and require long-term care in a facility that she would not want.  We will continue current treatment, not escalate, and add morphine drip for patient comfort.

## 2014-01-01 NOTE — Progress Notes (Signed)
PT Cancellation Note  Patient Details Name: Shane Pope MRN: [id number] DOB: June 11, 1931   Cancelled Treatment:    Reason Eval/Treat Not Completed: Other (comment) (dgtr present and denied therapy at this time. She is trying to decide on plan and requested deferral until mon and GOC confirmed)   Melford Aase 01/01/2014, 12:59 PM Elwyn Reach, [place]

## 2014-01-01 NOTE — Progress Notes (Addendum)
       Patient Name: Harrison Zetina Date of Encounter: 01/01/2014    SUBJECTIVE:Not feeling well.Still requiring much respiratory support.  TELEMETRY:  NSR with 1 degree AV block Filed Vitals:   01/01/14 0400 01/01/14 0907 01/01/14 0935 01/01/14 1156  BP: 111/62  101/43 97/43  Pulse: 80  89 82  Temp: 97.9 F (36.6 C)  97 F (36.1 C) 98.2 F (36.8 C)  TempSrc: Axillary  Oral Oral  Resp: 24  24 32  Height:      Weight: 176 lb 5.9 oz (80 kg)     SpO2: 95% 90% 90%     Intake/Output Summary (Last 24 hours) at 01/01/14 1213 Last data filed at 01/01/14 0935  Gross per 24 hour  Intake   1770 ml  Output   3200 ml  Net  -1430 ml   NET I/O: + 18 liters since admission  LABS: Basic Metabolic Panel:  Recent Labs  12/31/13 0648 01/01/14 0215  NA 147 145  K 4.3 4.2  CL 101 102  CO2 33* 33*  GLUCOSE 130* 123*  BUN 48* 54*  CREATININE 1.40* 1.35  CALCIUM 8.7 8.5  MG 2.0 2.1  PHOS 5.3* 5.2*   CBC:  Recent Labs  12/30/13 0434 01/01/14 0215  WBC 6.3 7.2  HGB 10.9* 11.1*  HCT 34.2* 35.1*  MCV 90.5 91.2  PLT 181 232    Radiology/Studies:  No new data  Physical Exam: Blood pressure 97/43, pulse 82, temperature 98.2 F (36.8 C), temperature source Oral, resp. rate 32, height 6' 0.84" (1.85 m), weight 176 lb 5.9 oz (80 kg), SpO2 90.00%. Weight change: 3.5 oz (0.1 kg)   Rhonchi on exam. Minimal wheeze.  ASSESSMENT:  1. Acute (on chronic) systolic HF  Plan:  1. Diuresis as tolerated by renal function and BP. Bump lasix to BID if BP allows 2. Please call as needed.  Demetrios Isaacs 01/01/2014, 12:13 PM

## 2014-01-01 NOTE — Progress Notes (Signed)
ANTIBIOTIC CONSULT NOTE - FOLLOW UP  Pharmacy Consult for ciprofloxacin Indication: pneumonia  No Known Allergies  Patient Measurements: Height: 6' 0.83" (185 cm) Weight: 176 lb 5.9 oz (80 kg) IBW/kg (Calculated) : 79.52  Vital Signs: Temp: 98.2 F (36.8 C) (02/27 1156) Temp src: Oral (02/27 1156) BP: 97/43 mmHg (02/27 1156) Pulse Rate: 82 (02/27 1156) Intake/Output from previous day: 02/26 0701 - 02/27 0700 In: 2295 [NG/GT:1895; IV Piggyback:400] Out: 6440 [Urine:3375] Intake/Output from this shift: Total I/O In: -  Out: 600 [Urine:600]  Labs:  Recent Labs  12/30/13 0255 12/30/13 0434 12/31/13 0648 01/01/14 0215  WBC  --  6.3  --  7.2  HGB  --  10.9*  --  11.1*  PLT  --  181  --  232  CREATININE 1.33  --  1.40* 1.35   Estimated Creatinine Clearance: 49.1 ml/min (by C-G formula based on Cr of 1.35). No results found for this basename: VANCOTROUGH, VANCOPEAK, VANCORANDOM, GENTTROUGH, GENTPEAK, GENTRANDOM, TOBRATROUGH, TOBRAPEAK, TOBRARND, AMIKACINPEAK, AMIKACINTROU, AMIKACIN,  in the last 72 hours   Microbiology: Recent Results (from the past 720 hour(s))  CULTURE, BLOOD (ROUTINE X 2)     Status: None   Collection Time    12/21/13 11:52 PM      Result Value Ref Range Status   Specimen Description BLOOD RIGHT HAND   Final   Special Requests     Final   Value: BOTTLES DRAWN AEROBIC AND ANAEROBIC 10CC BLUE 5CC RED   Culture  Setup Time     Final   Value: 12/22/2013 07:56     Performed at [facility]   Culture     Final   Value: NO GROWTH 5 DAYS     Performed at [facility]   Report Status 12/28/2013 FINAL   Final  CULTURE, RESPIRATORY (NON-EXPECTORATED)     Status: None   Collection Time    12/22/13  2:20 AM      Result Value Ref Range Status   Specimen Description TRACHEAL ASPIRATE   Final   Special Requests NONE   Final   Gram Stain     Final   Value: MODERATE WBC PRESENT, PREDOMINANTLY PMN     NO SQUAMOUS EPITHELIAL CELLS SEEN   MODERATE GRAM POSITIVE RODS     FEW GRAM NEGATIVE RODS     RARE YEAST   Culture     Final   Value: ABUNDANT PSEUDOMONAS AERUGINOSA     MODERATE YEAST CONSISTENT WITH CANDIDA SPECIES     Performed at [facility]   Report Status 12/24/2013 FINAL   Final   Organism ID, Bacteria PSEUDOMONAS AERUGINOSA   Final  URINE CULTURE     Status: None   Collection Time    12/22/13  2:56 AM      Result Value Ref Range Status   Specimen Description URINE, RANDOM   Final   Special Requests NONE   Final   Culture  Setup Time     Final   Value: 12/22/2013 08:04     Performed at [place]     Final   Value: NO GROWTH     Performed at [facility]   Culture     Final   Value: NO GROWTH     Performed at [facility]   Report Status 12/22/2013 FINAL   Final  MRSA PCR SCREENING     Status: None   Collection Time  12/22/13  2:56 AM      Result Value Ref Range Status   MRSA by PCR NEGATIVE  NEGATIVE Final   Comment:            The GeneXpert MRSA Assay (FDA     approved for NASAL specimens     only), is one component of a     comprehensive MRSA colonization     surveillance program. It is not     intended to diagnose MRSA     infection nor to guide or     monitor treatment for     MRSA infections.    Anti-infectives   Start     Dose/Rate Route Frequency Ordered Stop   12/25/13 1200  ciprofloxacin (CIPRO) IVPB 400 mg     400 mg 200 mL/hr over 60 Minutes Intravenous Every 12 hours 12/25/13 1054 01/06/14 1159   12/23/13 1100  piperacillin-tazobactam (ZOSYN) IVPB 3.375 g  Status:  Discontinued     3.375 g 12.5 mL/hr over 240 Minutes Intravenous Every 8 hours 12/23/13 1052 12/25/13 1054   12/22/13 0215  ampicillin-sulbactam (UNASYN) 1.5 g in sodium chloride 0.9 % 50 mL IVPB  Status:  Discontinued     1.5 g 100 mL/hr over 30 Minutes Intravenous Every 12 hours 12/22/13 0201 12/23/13 1051   12/22/13 0115  piperacillin-tazobactam (ZOSYN) IVPB 3.375  g     3.375 g 100 mL/hr over 30 Minutes Intravenous  Once 12/22/13 0113 12/24/13 2004   12/22/13 0045  cefTRIAXone (ROCEPHIN) 1 g in dextrose 5 % 50 mL IVPB  Status:  Discontinued     1 g 100 mL/hr over 30 Minutes Intravenous Every 24 hours 12/22/13 0043 12/22/13 1056   12/21/13 2330  cefTRIAXone (ROCEPHIN) 1 g in dextrose 5 % 50 mL IVPB  Status:  Discontinued     1 g 100 mL/hr over 30 Minutes Intravenous Every 24 hours 12/21/13 2317 12/21/13 2320      Assessment: 82 YOM admitted from home after being found down. Pan sensisitive pseuduomonas grew out in respiratory culture. Was started on Zosyn 2/18, now with sensitivities to change to ciprofloxacin. Today is D#8 of cipro and planning a course of 14 days. SCr has stabilized and is 1.35 today. Dose is appropriate. Pt is afebrile and WBC are WNL.   Goal of Therapy:  Eradication of infection  Plan:  1. Continue ciprofloxacin 400mg  IV q12h x 14 days  2. F/u renal fxn, C&S, clinical status  [facility], Pharm.D., BCPS Clinical Pharmacist Pager: [phone] 01/01/2014 1:24 PM

## 2014-01-01 NOTE — Consult Note (Signed)
WOC wound follow up Wound type: Unstageable pressure ulcer that was POA Measurement: 5.5cm x 9.5cm x 0.2cm  Wound bed: much improved with hydrotherapy, does appear to be shallow, 75% red/25% yellow-black soft eschar Drainage (amount, consistency, odor) moderate serosanguinous, no odor  Periwound: intact Dressing procedure/placement/frequency: continue Santyl and moist gauze for enzymatic debridement process.  Continue hydrotherapy M/W/F unless family updates POC with more of comfort care in mind.  Added air mattress now that patient has transferred from ICU to stepdown.    WOC will follow along at least weekly with PT for wound evaluation. Maryam Feely Pueblito RN,CWOCN [phone]

## 2014-01-01 NOTE — Progress Notes (Signed)
Panda tube dced; tip intact

## 2014-01-01 NOTE — Progress Notes (Signed)
Speech Language Pathology Treatment: Dysphagia  Patient Details Name: Shane Pope MRN: [id number] DOB: 16-Feb-1931 Today's Date: 01/01/2014 Time: 5056-9794 SLP Time Calculation (min): 34 min  Assessment / Plan / Recommendation Clinical Impression  Treatment focused on clearing pt's oral cavity and pharynx of copious dried mucous to facilitate PO trials. After pt relatively clean and clear he was able to swallow puree with multiple swallows but no overt evidence of aspiration.  RR remained high and O2 sats were stable but low at 87/88%.  [name], NPO status and mouth breathing is causing a significant decline in oral and oropharyngeal function which is further inhibiting progress toward safe PO intake. The pt is unlikely to return to PO diet without actually utilizing the swallow mechanism. Respiratory status is still tenuous and aspiration risk is high. Despite this, would recommend initiating a Dys 1 (puree) diet with pudding thick liquids to promote swallow function and increase pt's comfort. Would also suggest removing Panda to open nasal passages. Pt could be allowed ice chips between meals, after oral care to also increase oral hydration and function. Discussed with the pt's daughter who agreed that her father is unlikely to start swallowing without high risk in the near future. She agreed to this plan and accepted that there is still a very high risk that pt could aspirate with POs and suffer respiratory decline. Await discussion with Dr. Brigitte Pulse.    HPI HPI: 80 year old male with PMH of GERD, COPD, anemia, living independently, found on floor by family 2/16. Confused, unable to get up. In ER found to be hypotensive and in acute renal failure w/ profound metabolic derangements. Intubated 2/16, self extubated 2/21.    Pertinent Vitals NA  SLP Plan  Continue with current plan of care    Recommendations Diet recommendations: Dysphagia 1 (puree);Pudding-thick liquid Liquids provided via:  Teaspoon Medication Administration: Via alternative means Supervision: Full supervision/cueing for compensatory strategies;Staff to assist with self feeding Compensations: Slow rate;Small sips/bites;Multiple dry swallows after each bite/sip Postural Changes and/or Swallow Maneuvers: Seated upright 90 degrees              Oral Care Recommendations: Oral care Q4 per protocol;Oral care prior to ice chips;Oral care before and after PO Follow up Recommendations: 24 hour supervision/assistance Plan: Continue with current plan of care    GO    [facility], [place] CCC-SLP [phone]  Shane Pope 01/01/2014, 10:27 AM

## 2014-01-01 NOTE — Progress Notes (Signed)
Progress Note from the Palliative Medicine Team at [facility]:  patient  minimally responsive, generally  Uncomfortable, confused    -daughter upset seeing him "so uncomfortable"--continued conversation regarding GOC an options  -daughter now verbalizes that she wants to de-escalate curative interventions and focus on comfort, discussed with Dr Brigitte Pulse    -dc Panda and allow sips and chips as tolerated    -discussed with nursing importance of utilizing prn medications to enhance comfort   -continued emotional support to [facility], this has been very difficult for her    -Objective: No Known Allergies Scheduled Meds: . albuterol  2.5 mg Nebulization Q6H  . antiseptic oral rinse  15 mL Mouth Rinse QID  . chlorhexidine  15 mL Mouth Rinse BID  . ciprofloxacin  400 mg Intravenous Q12H  . collagenase   Topical Daily  . feeding supplement (PRO-STAT SUGAR FREE 64)  30 mL Per Tube BID  . free water  200 mL Per Tube Q6H  . furosemide  40 mg Intravenous Q breakfast  . insulin aspart  0-15 Units Subcutaneous 6 times per day  . ipratropium  0.5 mg Nebulization Q6H  . potassium chloride  20 mEq Per Tube Daily   Continuous Infusions: . feeding supplement (GLUCERNA 1.2 CAL) 1,000 mL (01/01/14 0508)   PRN Meds:.LORazepam, morphine injection, oxyCODONE-acetaminophen, RESOURCE THICKENUP CLEAR  BP 101/43  Pulse 89  Temp(Src) 97 F (36.1 C) (Oral)  Resp 24  Ht 6' 0.83" (1.85 m)  Wt 80 kg (176 lb 5.9 oz)  BMI 23.37 kg/m2  SpO2 90%   PPS:20 %At best   Intake/Output Summary (Last 24 hours) at 01/01/14 1152 Last data filed at 01/01/14 0935  Gross per 24 hour  Intake   1835 ml  Output   3200 ml  Net  -1365 ml        Physical Exam:  General:  ill appearing, dyspneic,  HEENT:  Dry buccal membranes, audible throat secretions Chest:   Decreased in bases, scattered coarse BS CVS: RRR Abdomen:soft NT +BS Ext:  Without edema Neuro: lethargic/weak, confused  Labs: CBC     Component Value Date/Time   WBC 7.2 01/01/2014 0215   RBC 3.85* 01/01/2014 0215   HGB 11.1* 01/01/2014 0215   HCT 35.1* 01/01/2014 0215   PLT 232 01/01/2014 0215   MCV 91.2 01/01/2014 0215   MCH 28.8 01/01/2014 0215   MCHC 31.6 01/01/2014 0215   RDW 16.2* 01/01/2014 0215   LYMPHSABS 1.3 12/21/2013 2138   MONOABS 0.7 12/21/2013 2138   EOSABS 0.0 12/21/2013 2138   BASOSABS 0.0 12/21/2013 2138    BMET    Component Value Date/Time   NA 145 01/01/2014 0215   K 4.2 01/01/2014 0215   CL 102 01/01/2014 0215   CO2 33* 01/01/2014 0215   GLUCOSE 123* 01/01/2014 0215   BUN 54* 01/01/2014 0215   CREATININE 1.35 01/01/2014 0215   CALCIUM 8.5 01/01/2014 0215   GFRNONAA 48* 01/01/2014 0215   GFRAA 56* 01/01/2014 0215    CMP     Component Value Date/Time   NA 145 01/01/2014 0215   K 4.2 01/01/2014 0215   CL 102 01/01/2014 0215   CO2 33* 01/01/2014 0215   GLUCOSE 123* 01/01/2014 0215   BUN 54* 01/01/2014 0215   CREATININE 1.35 01/01/2014 0215   CALCIUM 8.5 01/01/2014 0215   PROT 6.4 12/21/2013 2138   ALBUMIN 2.6* 12/21/2013 2138   AST 31 12/21/2013 2138   ALT 28 12/21/2013 2138  ALKPHOS 51 12/21/2013 2138   BILITOT 1.0 12/21/2013 2138   GFRNONAA 48* 01/01/2014 0215   GFRAA 56* 01/01/2014 0215      Assessment and Plan: 1. Code Status: DNR/DNI 2. Symptom Control:          Dyspnea:Morphine 1-2 mg every 1 hr prn           Anxiety: Ativan 1 mg every 6 hrs prn 3. Psycho/Social:  Emotional support offered to daughter at bedside,   I allowed space for her to express her thoughts,  feelings and fears.  She expresses gratitude for the visit. 4. Spiritual  Declined spiritual care 5. Disposition:  Dependant on outcomes, expect a hospital death.   Patient Documents Completed or Given: Document Given Completed  Advanced Directives Pkt    MOST    DNR    Gone from My Sight    Hard Choices yes     Time In Time Out Total Time Spent with Patient Total Overall Time  1130 1230 60 min 60 min    Greater than 50%  of  this time was spent counseling and coordinating care related to the above assessment and plan.  Wadie Lessen NP  Palliative Medicine Team Team Phone # [phone] Pager [phone]  Discussed with Dr Brigitte Pulse  PMT will continue to support holistically 1

## 2014-01-01 NOTE — Progress Notes (Signed)
Assuming care from [place]-  Subjective: "I feel like hell".  No specific complaints.  Looks a little stronger.  Objective: Vital signs in last 24 hours: Temp:  [97.9 F (36.6 C)-98.5 F (36.9 C)] 97.9 F (36.6 C) (02/27 0400) Pulse Rate:  [74-103] 80 (02/27 0400) Resp:  [20-32] 24 (02/27 0400) BP: (93-128)/(48-62) 111/62 mmHg (02/27 0400) SpO2:  [84 %-97 %] 95 % (02/27 0400) FiO2 (%):  [50 %] 50 % (02/26 1221) Weight:  [80 kg (176 lb 5.9 oz)] 80 kg (176 lb 5.9 oz) (02/27 0400) Weight change: 0.1 kg (3.5 oz) Last BM Date: 01/01/14  CBG (last 3)   Recent Labs  12/31/13 2018 01/01/14 0002 01/01/14 0501  GLUCAP 110* 135* 140*    Intake/Output from previous day: 02/26 0701 - 02/27 0700 In: 2295 [NG/GT:1895; IV Piggyback:400] Out: 4034 [Urine:3375] Intake/Output this shift:    General appearance: alert and frail, dysarthric due to thick oral secretions Eyes: no scleral icterus Throat: oropharynx moist without erythema; increased mucous production Resp: minimal expiratory wheezing upper airways Cardio: regular rate and rhythm GI: soft, non-tender; bowel sounds normal; no masses,  no organomegaly Extremities: no clubbing, cyanosis or edema   Lab Results:  Recent Labs  12/31/13 0648 01/01/14 0215  NA 147 145  K 4.3 4.2  CL 101 102  CO2 33* 33*  GLUCOSE 130* 123*  BUN 48* 54*  CREATININE 1.40* 1.35  CALCIUM 8.7 8.5  MG 2.0 2.1  PHOS 5.3* 5.2*   No results found for this basename: AST, ALT, ALKPHOS, BILITOT, PROT, ALBUMIN,  in the last 72 hours  Recent Labs  12/30/13 0434 01/01/14 0215  WBC 6.3 7.2  HGB 10.9* 11.1*  HCT 34.2* 35.1*  MCV 90.5 91.2  PLT 181 232   Lab Results  Component Value Date   INR 1.24 12/11/2012   INR 1.16 12/10/2012   INR 1.13 03/13/2012   No results found for this basename: CKTOTAL, CKMB, CKMBINDEX, TROPONINI,  in the last 72 hours No results found for this basename: TSH, T4TOTAL, FREET3, T3FREE,  THYROIDAB,  in the last 72 hours No results found for this basename: VITAMINB12, FOLATE, FERRITIN, TIBC, IRON, RETICCTPCT,  in the last 72 hours  Studies/Results: No results found.   Medications: Scheduled: . antiseptic oral rinse  15 mL Mouth Rinse QID  . chlorhexidine  15 mL Mouth Rinse BID  . ciprofloxacin  400 mg Intravenous Q12H  . collagenase   Topical Daily  . feeding supplement (PRO-STAT SUGAR FREE 64)  30 mL Per Tube BID  . free water  200 mL Per Tube Q6H  . furosemide  40 mg Intravenous Q breakfast  . insulin aspart  0-15 Units Subcutaneous 6 times per day  . potassium chloride  20 mEq Per Tube Daily   Continuous: . feeding supplement (GLUCERNA 1.2 CAL) 1,000 mL (01/01/14 0508)    Assessment/Plan: Principal Problem: 1. Acute respiratory failure secondary to Pseudomonas Pneumonia/ARDS- plan Cipro X 14 days (started 2/20).  Continue pulmonary toilet.  Add bronchodilators.   Active Problems: 2. Congestive Heart Failure, systolic/dilated cardiomyopathy (EF 30-35%)- Lasix decreased yesterday due to worsening azotemia though remains net I>>O since admission.  Medical therapy as tolerated per cardiology.  No BBlocker due to COPD/respiratory failure.  No ACE/ARB due to renal failure.   3. COPD (chronic obstructive pulmonary disease)- add albuterol/atrovent. 4. Acute renal failure- improved though slight increase in BUN with diuretics. 5. Weakness generalized- continue PT/OT. 6. Anemia- stable. 7.DIABETES MELLITUS, TYPE  II- continue SSI.   8. Hypertension- anti-hypertensives on hold due to recent hypotension requiring pressors. 9. Altered mental status- persistent confusion.   10. Malnutrition of moderate degree- continue Panda tube feeds 11. Sacral and right arm pressure ulcer- continue wound care, maximize nutrition. 12. Ethics- DNR, Palliative care consult in place.  Continue supportive care.  Will readdress goals as his condition evolves. 13. Disposition- anticipate need for  SNF for rehab- possible discharge next week.   LOS: 11 days   Marton Redwood 01/01/2014, 7:57 AM

## 2014-01-01 NOTE — Progress Notes (Signed)
See above and my note as well.

## 2014-01-01 NOTE — Progress Notes (Signed)
Physical Therapy Wound Treatment Patient Details  Name: Stepen Prins MRN: [id number] Date of Birth: [birth year]  Today's Date: 01/01/2014 Time: 0825-0855 Time Calculation (min): 30 min  Subjective  Subjective: Pt telling me I have children living in [place] and [place] (he doesn't know me).  Pain Score:  Moans when we turn him but no signs of pain with hydrotherapy.  Wound Assessment  Dressing Type Moist to dry;Foam;Barrier Film (skin prep);Other (Comment)  Dressing Intact  Dressing Change Frequency Daily  State of Healing Early/partial granulation  Site / Wound Assessment Yellow;Red;Black  % Wound base Red or Granulating 75%  % Wound base Yellow 15%  % Wound base Black 10%  % Wound base Other (Comment) 0%  Peri-wound Assessment Purple  Wound Length (cm) 5.5 cm  Wound Width (cm) 9.5 cm  Wound Depth (cm) 0 cm  Margins Unattached edges (unapproximated)  Drainage Amount Moderate  Drainage Description Serosanguineous  Treatment Hydrotherapy (Pulse lavage)   Hydrotherapy Pulsed lavage therapy - wound location: sacrum Pulsed Lavage with Suction (psi): 8 psi Pulsed Lavage with Suction - Normal Saline Used: 1000 mL Pulsed Lavage Tip: Tip with splash shield   Wound Assessment and Plan  Wound Therapy - Assess/Plan/Recommendations Wound Therapy - Clinical Statement: Eschar is thinning markedly and more granulation present. Hydrotherapy Plan: Debridement;Dressing change;Patient/family education;Pulsatile lavage with suction Wound Therapy - Frequency: 3X / week Wound Therapy - Follow Up Recommendations: Skilled nursing facility Wound Plan: See above  Wound Therapy Goals- Improve the function of patient's integumentary system by progressing the wound(s) through the phases of wound healing (inflammation - proliferation - remodeling) by: Decrease Necrotic Tissue to: 0 Decrease Necrotic Tissue - Progress: Goal set today Increase Granulation Tissue to: 100 Increase Granulation  Tissue - Progress: Goal set today Time For Goal Achievement: 7 days Wound Therapy - Potential for Goals: Good  Goals will be updated until maximal potential achieved or discharge criteria met.  Discharge criteria: when goals achieved, discharge from hospital, MD decision/surgical intervention, no progress towards goals, refusal/missing three consecutive treatments without notification or medical reason.  GP     Sonia Bromell 01/01/2014, 9:57 AM  [facility] PT [phone]

## 2014-01-02 LAB — BASIC METABOLIC PANEL
BUN: 57 mg/dL — ABNORMAL HIGH (ref 6–23)
CHLORIDE: 100 meq/L (ref 96–112)
CO2: 36 mEq/L — ABNORMAL HIGH (ref 19–32)
Calcium: 8.6 mg/dL (ref 8.4–10.5)
Creatinine, Ser: 1.55 mg/dL — ABNORMAL HIGH (ref 0.50–1.35)
GFR, EST AFRICAN AMERICAN: 47 mL/min — AB (ref >90)
GFR, EST NON AFRICAN AMERICAN: 41 mL/min — AB (ref >90)
GLUCOSE: 134 mg/dL — AB (ref 70–99)
POTASSIUM: 4.5 meq/L (ref 3.7–5.3)
SODIUM: 146 meq/L (ref 137–147)

## 2014-01-02 LAB — MAGNESIUM: Magnesium: 2.3 mg/dL (ref 1.5–2.5)

## 2014-01-02 LAB — PHOSPHORUS: PHOSPHORUS: 6.1 mg/dL — AB (ref 2.3–4.6)

## 2014-01-02 LAB — GLUCOSE, CAPILLARY
GLUCOSE-CAPILLARY: 105 mg/dL — AB (ref 70–99)
GLUCOSE-CAPILLARY: 94 mg/dL (ref 70–99)
Glucose-Capillary: 114 mg/dL — ABNORMAL HIGH (ref 70–99)
Glucose-Capillary: 108 mg/dL — ABNORMAL HIGH (ref 70–99)
Glucose-Capillary: 137 mg/dL — ABNORMAL HIGH (ref 70–99)
Glucose-Capillary: 115 mg/dL — ABNORMAL HIGH (ref 70–99)

## 2014-01-02 MED ORDER — FUROSEMIDE 10 MG/ML IJ SOLN
40.0000 mg | Freq: Every day | INTRAMUSCULAR | Status: DC
  Administered 2014-01-03: 40 mg via INTRAVENOUS
  Filled 2014-01-02: qty 4

## 2014-01-02 NOTE — Progress Notes (Signed)
Speech Language Pathology Treatment: Dysphagia  Patient Details Name: Shane Pope MRN: [id number] DOB: [birth year] Today's Date: 01/02/2014 Time: 3419-3790 SLP Time Calculation (min): 35 min  Assessment / Plan / Recommendation Clinical Impression  Continued f/u for safety with eating.  NG discontinued.  Daughter, Shane Pope, present, and has multiple questions today re: father's condition and the path she should choose for his care (comfort vs. more active treatment.)   Her indecision is prompted by her father's improved Sp02 levels and RR today.  Provided support and encouraged her to speak with nurse and Palliative Medicine staff this weekend to address her questions.  Pt on non-rebreather.  He was intermittently alert during our session and requested Coke.  Provided oral care; administered limited ice chips and two 1/2 tspn sized boluses Coke.  Pt with improved oral manipulation; persisting swallow delays and intermittent cough associated with PO intake, likely aspiration.  Demonstrated to Shane Pope how to ascertain presence of swallow.  She would like to give her father small sips of soda if he derives pleasure from that.    Will continue to follow for GOC and plan.  Diet has been ordered but pt has not been sufficiently alert.  Rec  feeding with extreme caution and only if mental status allows.        Pertinent Vitals   SLP Plan  Continue with current plan of care    Recommendations Diet recommendations: Dysphagia 1 (puree);Pudding-thick liquid (1/4 tspn thin per preferences) Liquids provided via: Teaspoon Supervision: Full supervision/cueing for compensatory strategies Compensations: Slow rate;Small sips/bites;Multiple dry swallows after each bite/sip Postural Changes and/or Swallow Maneuvers: Seated upright 90 degrees              Oral Care Recommendations: Oral care BID Plan: Continue with current plan of care   Shane Pope L. Shane Pope, [place] CCC/SLP Pager [phone]      Shane Pope  Shane Pope 01/02/2014, 11:05 AM

## 2014-01-02 NOTE — Progress Notes (Signed)
Subjective:   No events overnight   Objective:   Temp:  [96.4 F (35.8 C)-98.3 F (36.8 C)] 98.3 F (36.8 C) (02/28 0700) Pulse Rate:  [65-89] 65 (02/28 0700) Resp:  [22-32] 22 (02/28 0700) BP: (97-110)/(41-51) 107/41 mmHg (02/28 0700) SpO2:  [86 %-100 %] 92 % (02/28 0700) Weight:  [170 lb (77.111 kg)] 170 lb (77.111 kg) (02/28 0500) Last BM Date:  (flexiseal)  Filed Weights   12/31/13 0400 01/01/14 0400 01/02/14 0500  Weight: 176 lb 2.4 oz (79.9 kg) 176 lb 5.9 oz (80 kg) 170 lb (77.111 kg)    Intake/Output Summary (Last 24 hours) at 01/02/14 0824 Last data filed at 01/02/14 0747  Gross per 24 hour  Intake  212.1 ml  Output   2025 ml  Net -1812.9 ml    Telemetry:NSR  Exam:  General: NAD  Resp:clear anteriorally  Cardiac:RRR, no m/r/g,no JVD  DJ:MEQASTM soft, NT, ND  MSK: extremities are warm, no edema  Neuro: no focal deficits   Lab Results:  Basic Metabolic Panel:  Recent Labs Lab 12/31/13 0648 01/01/14 0215 01/02/14 0310  NA 147 145 146  K 4.3 4.2 4.5  CL 101 102 100  CO2 33* 33* 36*  GLUCOSE 130* 123* 134*  BUN 48* 54* 57*  CREATININE 1.40* 1.35 1.55*  CALCIUM 8.7 8.5 8.6  MG 2.0 2.1 2.3    Liver Function Tests: No results found for this basename: AST, ALT, ALKPHOS, BILITOT, PROT, ALBUMIN,  in the last 168 hours  CBC:  Recent Labs Lab 12/29/13 0621 12/30/13 0434 01/01/14 0215  WBC 7.7 6.3 7.2  HGB 11.3* 10.9* 11.1*  HCT 34.7* 34.2* 35.1*  MCV 90.1 90.5 91.2  PLT 157 181 232    Cardiac Enzymes:  Recent Labs Lab 12/27/13 0815 12/27/13 1540 12/27/13 2015  TROPONINI <0.30 <0.30 <0.30    BNP:  Recent Labs  12/22/13 0425  PROBNP 418.4    Coagulation: No results found for this basename: INR,  in the last 168 hours  ECG:   Medications:   Scheduled Medications: . albuterol  2.5 mg Nebulization Q6H  . antiseptic oral rinse  15 mL Mouth Rinse QID  . chlorhexidine  15 mL Mouth Rinse BID  . ciprofloxacin  400  mg Intravenous Q12H  . collagenase   Topical Daily  . feeding supplement (PRO-STAT SUGAR FREE 64)  30 mL Per Tube BID  . free water  200 mL Per Tube Q6H  . furosemide  40 mg Intravenous BID  . insulin aspart  0-15 Units Subcutaneous 6 times per day  . ipratropium  0.5 mg Nebulization Q6H  . potassium chloride  20 mEq Per Tube Daily     Infusions: . feeding supplement (GLUCERNA 1.2 CAL) 1,000 mL (01/01/14 0508)  . morphine 1 mg/hr (01/01/14 1854)     PRN Medications:  LORazepam, morphine injection, oxyCODONE-acetaminophen, RESOURCE THICKENUP CLEAR     Assessment/Plan   80 yo male history of HTN, DM, PAD, hyperlipidemia admitted with AMS, hypotension, and AKI. Suffered PEA arrest due to airway hypoxia/aspiration pneumonia. Noted to have moderate LV systolic dysfunction by echo.  1. Acute systolic hear failure - echo shows LVEF 30-35%, akinesis of the distalanteropeptal wall.  - no plans for ischemic evaluation per primary rounding cardiology team - net negative 1.7 linters yesterday, still + 1.76 liters since admission. He is on lasix 40mg  IV bid, Cr is trending up. Will decrease lasix to 40mg  IV q day for today - soft blood  pressures have not allowed initiation of beta blocker or ACE-I - morphine drip added yesterday for patient comfort, pt is DNR. Per primary team notes no plans to escalate care, continue current treatment.       Carlyle Dolly, M.D., F.A.C.C.

## 2014-01-02 NOTE — Consult Note (Signed)
I have reviewed and discussed the care of this patient in detail with the nurse practitioner including pertinent patient records, physical exam findings and data. I agree with details of this encounter.  

## 2014-01-02 NOTE — Progress Notes (Signed)
Subjective: He feels okay today.  He is comfortable on the low dose morphine gtt.  Objective: Vital signs in last 24 hours: Temp:  [96.4 F (35.8 C)-98.3 F (36.8 C)] 98.3 F (36.8 C) (02/28 0700) Pulse Rate:  [65-89] 89 (02/28 0945) Resp:  [22-32] 24 (02/28 0945) BP: (97-110)/(41-51) 100/47 mmHg (02/28 0800) SpO2:  [86 %-100 %] 98 % (02/28 0945) Weight:  [77.111 kg (170 lb)] 77.111 kg (170 lb) (02/28 0500) Weight change: -2.889 kg (-6 lb 5.9 oz) Last BM Date:  (flexiseal)  Intake/Output from previous day: 02/27 0701 - 02/28 0700 In: 212.1 [I.V.:12.1; IV Piggyback:200] Out: 1975 [NGEXB:2841] Intake/Output this shift: Total I/O In: 42 [P.O.:40; I.V.:2] Out: 50 [Urine:50]  General appearance: drowsy but responds to questions Resp: clear to auscultation bilaterally Cardio: regular rate and rhythm, S1, S2 normal, no murmur, click, rub or gallop GI: soft, non-tender; bowel sounds normal; no masses,  no organomegaly Extremities: extremities normal, atraumatic, no cyanosis or edema Neurologic: again, he is drowsy but responds.   Lab Results:  Recent Labs  01/01/14 0215  WBC 7.2  HGB 11.1*  HCT 35.1*  PLT 232   BMET  Recent Labs  01/01/14 0215 01/02/14 0310  NA 145 146  K 4.2 4.5  CL 102 100  CO2 33* 36*  GLUCOSE 123* 134*  BUN 54* 57*  CREATININE 1.35 1.55*  CALCIUM 8.5 8.6   CMET CMP     Component Value Date/Time   NA 146 01/02/2014 0310   K 4.5 01/02/2014 0310   CL 100 01/02/2014 0310   CO2 36* 01/02/2014 0310   GLUCOSE 134* 01/02/2014 0310   BUN 57* 01/02/2014 0310   CREATININE 1.55* 01/02/2014 0310   CALCIUM 8.6 01/02/2014 0310   PROT 6.4 12/21/2013 2138   ALBUMIN 2.6* 12/21/2013 2138   AST 31 12/21/2013 2138   ALT 28 12/21/2013 2138   ALKPHOS 51 12/21/2013 2138   BILITOT 1.0 12/21/2013 2138   GFRNONAA 41* 01/02/2014 0310   GFRAA 47* 01/02/2014 0310     Studies/Results: No results found.  Medications: I have reviewed the patient's current  medications.  Marland Kitchen albuterol  2.5 mg Nebulization Q6H  . antiseptic oral rinse  15 mL Mouth Rinse QID  . chlorhexidine  15 mL Mouth Rinse BID  . ciprofloxacin  400 mg Intravenous Q12H  . collagenase   Topical Daily  . feeding supplement (PRO-STAT SUGAR FREE 64)  30 mL Per Tube BID  . free water  200 mL Per Tube Q6H  . [START ON 01/03/2014] furosemide  40 mg Intravenous Daily  . insulin aspart  0-15 Units Subcutaneous 6 times per day  . ipratropium  0.5 mg Nebulization Q6H  . potassium chloride  20 mEq Per Tube Daily   CBG (last 3)   Recent Labs  01/02/14 0134 01/02/14 0435 01/02/14 0748  GLUCAP 114* 105* 108*      Assessment/Plan:  Principal Problem:   Acute respiratory failure-still requiring high dose oxygen Active Problems:   DIABETES MELLITUS, TYPE II-controlled   HYPERLIPIDEMIA-follow   Hypertension-bp low, nL   COPD (chronic obstructive pulmonary disease)-on nebs   Acute renal failure-mostly stable now   Altered mental status-waxing and waning a bit now   GI bleed-h/h stable.   Malnutrition of moderate degree-ensure offered   Congestive dilated cardiomyopathy-per cards   Palliative care encounter-we are in a holding pattern now. Not clear that he will recover.  Continue current measures and take it a day at a time.  But, if he does not take much in orally he will continue to decline.   Weakness generalized-severe   Dyspnea-the morphine has helped.   Acute on chronic combined systolic and diastolic congestive heart failure-follow   LOS: 12 days   Lamiya Naas A, MD 01/02/2014, 11:23 AM

## 2014-01-03 LAB — PHOSPHORUS: Phosphorus: 4.4 mg/dL (ref 2.3–4.6)

## 2014-01-03 LAB — BASIC METABOLIC PANEL
BUN: 58 mg/dL — AB (ref 6–23)
CHLORIDE: 103 meq/L (ref 96–112)
CO2: 35 mEq/L — ABNORMAL HIGH (ref 19–32)
Calcium: 8.7 mg/dL (ref 8.4–10.5)
Creatinine, Ser: 1.52 mg/dL — ABNORMAL HIGH (ref 0.50–1.35)
GFR calc Af Amer: 48 mL/min — ABNORMAL LOW (ref >90)
GFR calc non Af Amer: 42 mL/min — ABNORMAL LOW (ref >90)
GLUCOSE: 121 mg/dL — AB (ref 70–99)
Potassium: 4.8 mEq/L (ref 3.7–5.3)
Sodium: 149 mEq/L — ABNORMAL HIGH (ref 137–147)

## 2014-01-03 LAB — GLUCOSE, CAPILLARY
GLUCOSE-CAPILLARY: 105 mg/dL — AB (ref 70–99)
Glucose-Capillary: 112 mg/dL — ABNORMAL HIGH (ref 70–99)
Glucose-Capillary: 123 mg/dL — ABNORMAL HIGH (ref 70–99)
Glucose-Capillary: 93 mg/dL (ref 70–99)

## 2014-01-03 LAB — MAGNESIUM: Magnesium: 2.6 mg/dL — ABNORMAL HIGH (ref 1.5–2.5)

## 2014-01-03 MED ORDER — COLLAGENASE 250 UNIT/GM EX OINT: TOPICAL_OINTMENT | Freq: Every day | CUTANEOUS | Status: AC

## 2014-01-03 MED ORDER — CIPROFLOXACIN IN D5W 400 MG/200ML IV SOLN: 400.0000 mg | Freq: Two times a day (BID) | INTRAVENOUS | Status: AC

## 2014-01-03 MED ORDER — POTASSIUM CHLORIDE 20 MEQ/15ML (10%) PO LIQD: 20.0000 meq | Freq: Every day | ORAL | Status: AC

## 2014-01-03 MED ORDER — MORPHINE SULFATE 2 MG/ML IJ SOLN: 1.0000 mg | INTRAMUSCULAR | Status: AC | PRN

## 2014-01-03 MED ORDER — MORPHINE SULFATE 10 MG/ML IJ SOLN
1.0000 mg/h | INTRAMUSCULAR | Status: AC
Start: 2014-01-03 — End: ?

## 2014-01-03 MED ORDER — IPRATROPIUM BROMIDE 0.02 % IN SOLN: 0.5000 mg | Freq: Four times a day (QID) | RESPIRATORY_TRACT | Status: AC

## 2014-01-03 MED ORDER — ALBUTEROL SULFATE (2.5 MG/3ML) 0.083% IN NEBU: 2.5000 mg | INHALATION_SOLUTION | Freq: Four times a day (QID) | RESPIRATORY_TRACT | Status: AC

## 2014-01-03 MED ORDER — FUROSEMIDE 10 MG/ML IJ SOLN: 40.0000 mg | Freq: Every day | INTRAMUSCULAR | Status: AC

## 2014-01-03 MED ORDER — BIOTENE DRY MOUTH MT LIQD: 15.0000 mL | Freq: Four times a day (QID) | OROMUCOSAL | Status: AC

## 2014-01-03 MED ORDER — LORAZEPAM 2 MG/ML IJ SOLN: 1.0000 mg | Freq: Four times a day (QID) | INTRAMUSCULAR | Status: AC | PRN

## 2014-01-03 NOTE — Progress Notes (Addendum)
Palliative Medicine Team  Progress Note  Met with patient's daughter Magda Paganini today at her request. Patient continues to decline. He is on a low dose morphine infusion but also on a NRB O2 Mask, has a flexiseal for diarrhea, unstageable sacral decubitus requiring painful daily hydrotherpay, not taking PO, severe heart failure, COPD, intubated earlier in this admission with respiratory failure, and also found down at home. He has an overwhelmingly poor prognosis and limited chance for making a meaningful recovery-especially back to independence which was of primary importace to him for QOL-he would never desire or do well in a long term care facility. He is approaching EOL- daughter very ready to shift to full comfort but reports feeling guilt because she feels that other providers have pushed for more time and continued intervention "to see if he will improve". With an unstagable decubitus among his immanently life threatening issues-should he survive it would be a long and rough road and he most certainly would not be driving his car again. Daughter is clear that she doesn't want to go down a long road with the end result being poor QOL.  Daughter desires comfort as primary goal- I have strongly recommended a hospice facility for his EOL care- ideally we could get him off the uncomfortable NRB, shave his face(daughters request), titrate his morphine, control agitation so that he is resting peacefully with his dignity intact. Patient grew up in [facility] and has often said [place] wants [place] I have recommended [facility]. Daughter very agreeable and they will be able to continue with a very high level of care and comfort in this environment. Will ask CSW to see what bed availability is today. I will also try to get in touch with the primary service to update them on the daughters wishes for Hospice Care. I think this is a very reasonable and appropriate route for this  patient. Prognosis: days-<2 weeeks.  35 minutes. Greater than 50%  of this time was spent counseling and coordinating care related to the above assessment and plan.   Lane Hacker, DO Palliative Medicine

## 2014-01-03 NOTE — Discharge Summary (Addendum)
  Physician Discharge Summary  Patient ID: Shane Pope MRN: [id number] DOB/AGE: 11/15/1930 80 y.o.  Admit date: 12/21/2013 Discharge date: 01/03/2014  Admission Diagnoses:  Discharge Diagnoses:  Principal Problem:   Acute respiratory failure-possible aspiration pneumonia Pseudomonas pneumonia Active Problems:   DIABETES MELLITUS, TYPE II   HYPERLIPIDEMIA   Hypertension   COPD (chronic obstructive pulmonary disease)   Acute renal failure   Altered mental status   GI bleed   Malnutrition of moderate degree   Congestive dilated cardiomyopathy   Palliative care encounter   Weakness generalized   Dyspnea   Acute on chronic combined systolic and diastolic congestive heart failure nonstageable sacral decubitus ulcer   Discharged Condition: poor  Hospital Course: Shane Pope is  An 80 yo gentleman with multiple medical problems.  He was found down.  He presented to [facility] and was admitted to the medical ICU.  He had acute renal failure, acute respiratory failure and sepsis and required pressor therapy. He was placed on broad spectrum abx.  He required intubation shortly after admission and mechanical ventilation but he self-extubated on 12/26/13.   He was found to have significant systolic chf with an EF of 35%.   He remained very weak but he was able to be weaned off of pressor therapy.  Due to his overall poor status he was moved from the ICU to the stepdown unit for consideration of palliative care.  He was maintained on high dose face mask oxygen therapy, IV lasix and cipro for his sacral wound and for his pneumonia.  His sacral wound is being dressed with santyl and then covered with a foam dressing and changed daily.  He did not improve.  On 01/01/14 low dose morphine drip was initiated and this greatly improved his comfort.  On 01/03/14 it was determined that he would receive full hospice care given his very poor prognosis.  On 01/03/14 he was transferred to the hospice home in [facility] for ongoing  care and for expected terminal care.  Consults: cardiology, critical care medicine, palliative care medicine.  Significant Diagnostic Studies:  Ct cervical spine, ct head, renal u/s,  Echocardiogram. Chest xray  Treatments: see above.  Discharge Exam:  Blood pressure 106/50, pulse 73, temperature 97.3 F (36.3 C), temperature source Axillary, resp. rate 7, height 6' 0.84" (1.85 m), weight 80.74 kg (178 lb), SpO2 100.00%.  Physical Exam:age appropriate,  Somnolent.   Responds to questions but is not totally coherent. Lungs reveal decreased breath sounds bilaterally,  Heart is rrr with no significant m/r/g.  Abd is soft, nt, nd, no mass or has.  He has 1+ edema of both lower extremities.  He has a flexiseal in place to control his bowel production.  Sacrum is dressed  Disposition: to hospice home in [place], [place]     Medication List   Albuterol neb  2.5 mg/ 3 mL   0.083%  One neb q 6 hrs prn atrovent neb  0.5 mg   One neb q6 hrs prn Biotene 15 ML  Mouth rinse qid cipro  400 mg iv  q12hrs Santyl ointment  Daily to sacral wound Lasix 40 mg iv daily Ativan 1 mg IV q 6 hrs prn anxiety Morphine drip  1 mg/hour continuous Morphine 1-2 mg IV q 1-2 hrs prn pain or dyspnea Potassium chloride 20 meq in 15 ML soln po daily  He is dnr.   SignedCrist Infante A 01/03/2014, 12:35 PM

## 2014-01-03 NOTE — Progress Notes (Addendum)
CSW (Clinical Education officer, museum) made aware that [employer] [facility] will be coming to evaluate pt for possible transfer to residential hospice today. CSW printed clinicals and faxed one copy to [facility] who would be potentially receiving the pt and left one copy with unit secretary to give to [place] upon arrival.  CSW to await visit from liaison and help with transport if/when needed.  ADDENDUM: CSW received call from [facility] with [facility]. They will be admitting pt today and have all necessary paperwork. Darcy to call for PTAR and has spoken with family. CSW updated MD (Dr. Hilma Favors).  [place], [place]

## 2014-01-03 NOTE — Progress Notes (Signed)
Pt transported to hospice via Ambulance, Daughter Magda Paganini by the bedside. Morphine Gtt stopped and remainder discarded to the sink and witnessed by 2nd RN.Patient belongings returned.

## 2014-01-03 NOTE — Discharge Instructions (Signed)
Pt discharged to Hospital via Ambulance ,daughter Magda Paganini by the bedside.

## 2014-01-03 NOTE — Progress Notes (Signed)
Patient ID: Shane Pope, male   DOB: May 11, 1931, 80 y.o.   MRN: [id number]    Subjective:    No events overnight  Objective:   Temp:  [97.3 F (36.3 C)-99 F (37.2 C)] 97.3 F (36.3 C) (03/01 0400) Pulse Rate:  [72-89] 77 (03/01 0510) Resp:  [17-29] 21 (03/01 0510) BP: (91-117)/(46-62) 97/47 mmHg (03/01 0510) SpO2:  [87 %-100 %] 98 % (03/01 0721) Weight:  [178 lb (80.74 kg)] 178 lb (80.74 kg) (03/01 0400) Last BM Date:  (flexiseal)  Filed Weights   01/01/14 0400 01/02/14 0500 01/03/14 0400  Weight: 176 lb 5.9 oz (80 kg) 170 lb (77.111 kg) 178 lb (80.74 kg)    Intake/Output Summary (Last 24 hours) at 01/03/14 0734 Last data filed at 01/03/14 0600  Gross per 24 hour  Intake    463 ml  Output    675 ml  Net   -212 ml    Telemetry: NSR  Exam:  General:lethargic  Resp: clear anteriorally  Cardiac: RRR, no m/r/g, no JVD, no carotid bruits  XQ:JJHERDE soft, NT, ND  MSK: LE warm, no edema    Lab Results:  Basic Metabolic Panel:  Recent Labs Lab 01/01/14 0215 01/02/14 0310 01/03/14 0310  NA 145 146 149*  K 4.2 4.5 4.8  CL 102 100 103  CO2 33* 36* 35*  GLUCOSE 123* 134* 121*  BUN 54* 57* 58*  CREATININE 1.35 1.55* 1.52*  CALCIUM 8.5 8.6 8.7  MG 2.1 2.3 2.6*    Liver Function Tests: No results found for this basename: AST, ALT, ALKPHOS, BILITOT, PROT, ALBUMIN,  in the last 168 hours  CBC:  Recent Labs Lab 12/29/13 0621 12/30/13 0434 01/01/14 0215  WBC 7.7 6.3 7.2  HGB 11.3* 10.9* 11.1*  HCT 34.7* 34.2* 35.1*  MCV 90.1 90.5 91.2  PLT 157 181 232    Cardiac Enzymes:  Recent Labs Lab 12/27/13 0815 12/27/13 1540 12/27/13 2015  TROPONINI <0.30 <0.30 <0.30    BNP:  Recent Labs  12/22/13 0425  PROBNP 418.4    Coagulation: No results found for this basename: INR,  in the last 168 hours  ECG:   Medications:   Scheduled Medications: . albuterol  2.5 mg Nebulization Q6H  . antiseptic oral rinse  15 mL Mouth Rinse QID  .  chlorhexidine  15 mL Mouth Rinse BID  . ciprofloxacin  400 mg Intravenous Q12H  . collagenase   Topical Daily  . feeding supplement (PRO-STAT SUGAR FREE 64)  30 mL Per Tube BID  . free water  200 mL Per Tube Q6H  . furosemide  40 mg Intravenous Daily  . insulin aspart  0-15 Units Subcutaneous 6 times per day  . ipratropium  0.5 mg Nebulization Q6H  . potassium chloride  20 mEq Per Tube Daily     Infusions: . feeding supplement (GLUCERNA 1.2 CAL) 1,000 mL (01/01/14 0508)  . morphine 1 mg/hr (01/01/14 1854)     PRN Medications:  LORazepam, morphine injection, oxyCODONE-acetaminophen, RESOURCE THICKENUP CLEAR     Assessment/Plan    80 yo male history of HTN, DM, PAD, hyperlipidemia admitted with AMS, hypotension, and AKI. Suffered PEA arrest due to airway hypoxia/aspiration pneumonia. Noted to have moderate LV systolic dysfunction by echo.   1. Acute systolic hear failure  - echo shows LVEF 30-35%, akinesis of the distalanteropeptal wall.  - no plans for ischemic evaluation per primary rounding cardiology team  - net negative 228mL yesterday, still + 16.5 liters since admission. Cr  is trending up, decreased to lasix 40mg  IV qday yesterday. Cr stable from yesterday, continue current diuretic dosing.  - soft blood pressures have not allowed initiation of beta blocker or ACE-I  - morphine has been added by primary team for patient comfort, pt is DNR. Per primary team notes no plans to escalate care, continue current treatment. Considering palliative care.  - from CHF standpoint continue current level of diuresis.        Carlyle Dolly, M.D., F.A.C.C.

## 2014-02-03 DEATH — deceased

## 2015-01-17 IMAGING — CR DG CHEST 1V PORT
2 series · 2 of 2 positions shown · non-contrast
Comparison: [DATE]

CLINICAL DATA: Check endotracheal tube

EXAM:
PORTABLE CHEST - 1 VIEW

[AP (1 of 2)]
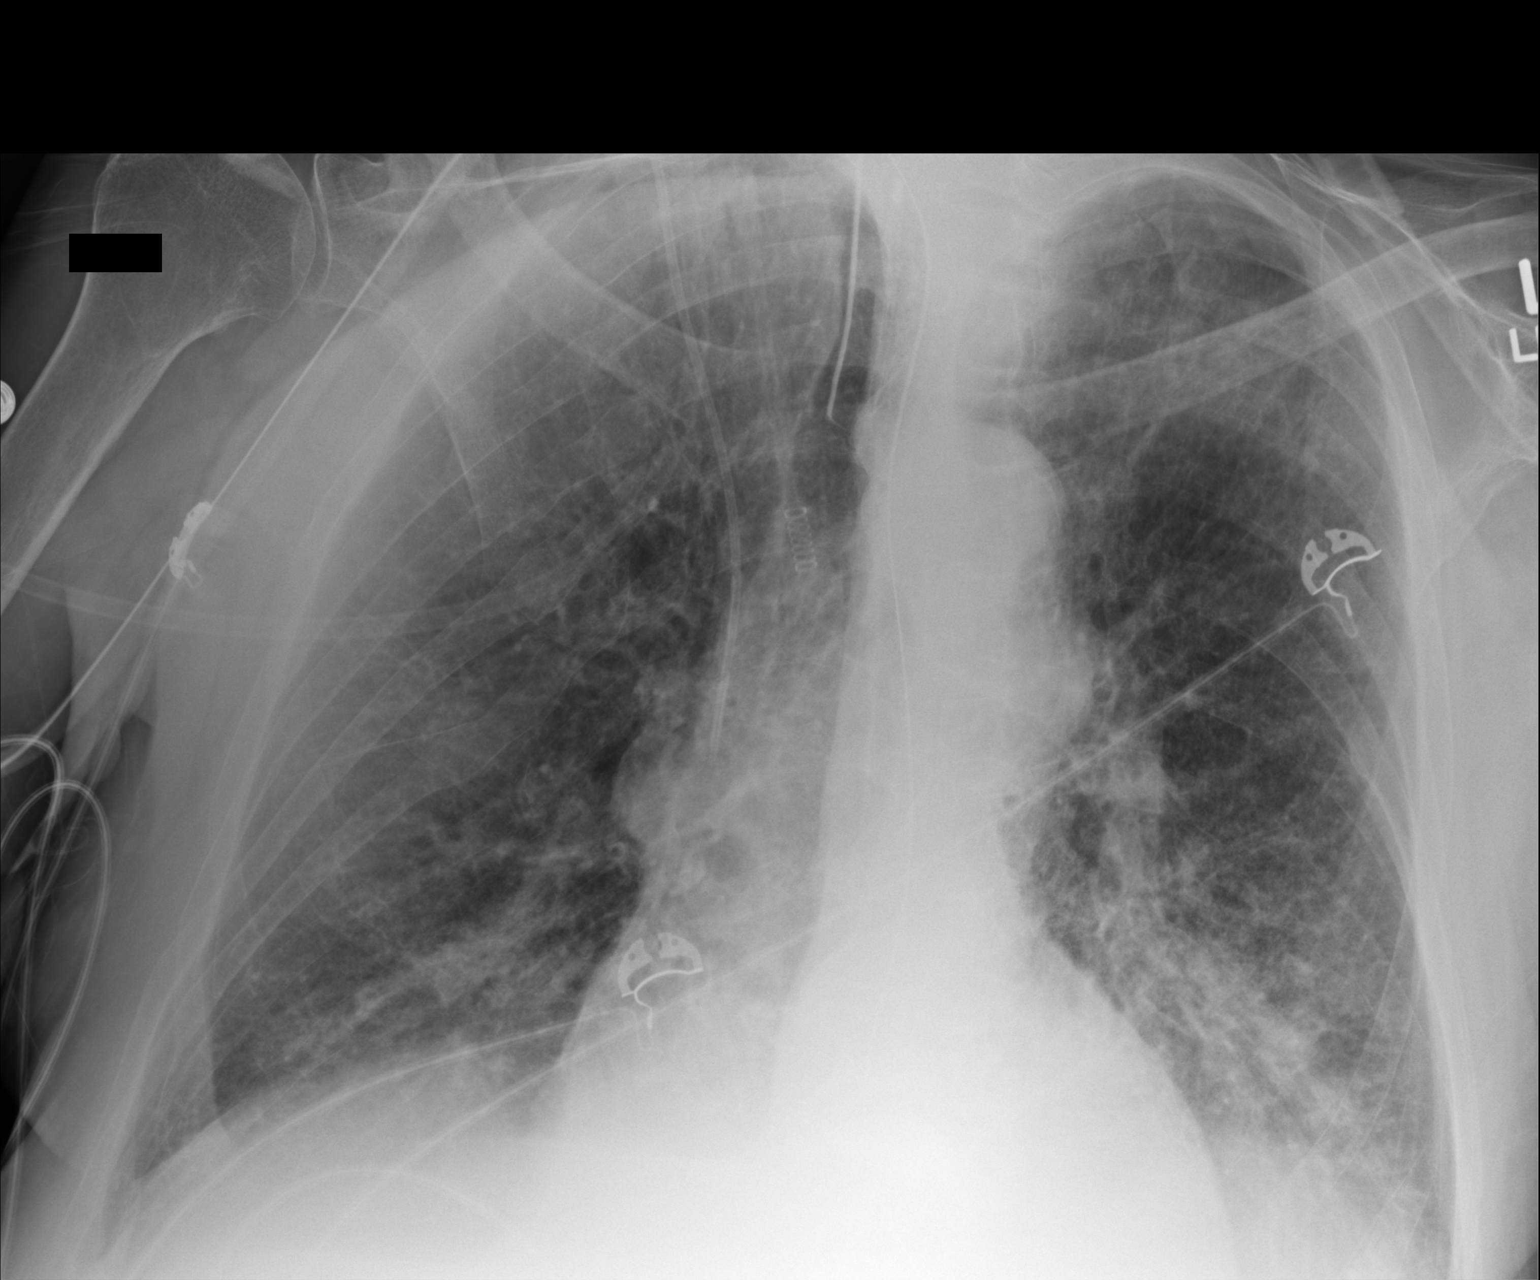

[AP (2 of 2)]
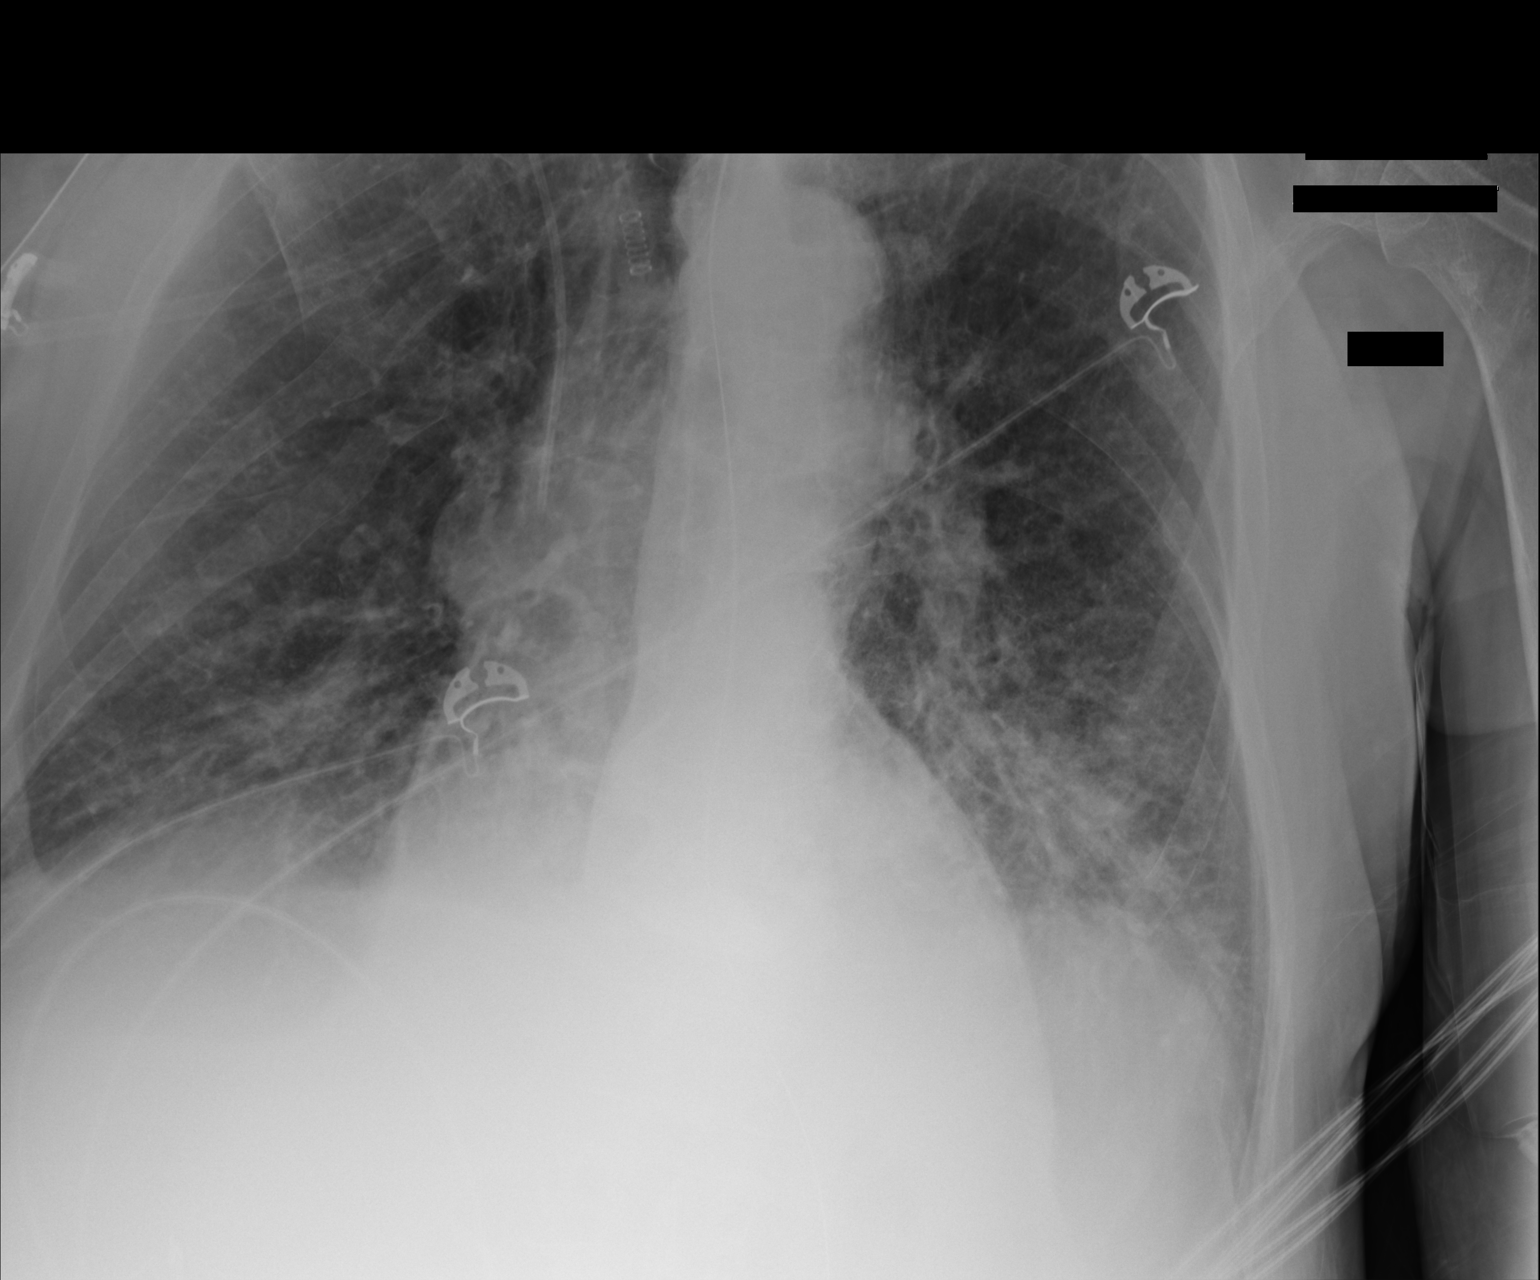

[2 of 2 positions shown; findings below may reference images not displayed]

FINDINGS: Right jugular line is again identified and stable. An endotracheal
tube is noted 6 cm above the carina stable in appearance from the
prior exam. A nasogastric catheter is noted within the stomach. The
cardiac shadow is stable. Increasing left basilar infiltrate is
noted. Diffuse interstitial changes are again seen.
IMPRESSION: Increasing left basilar infiltrate.

## 2015-01-20 IMAGING — DX DG CHEST 1V PORT
1 series · 1 of 1 positions shown · non-contrast
Comparison: 12/26/2013

CLINICAL DATA: Volume overload

EXAM:
PORTABLE CHEST - 1 VIEW

[portable]
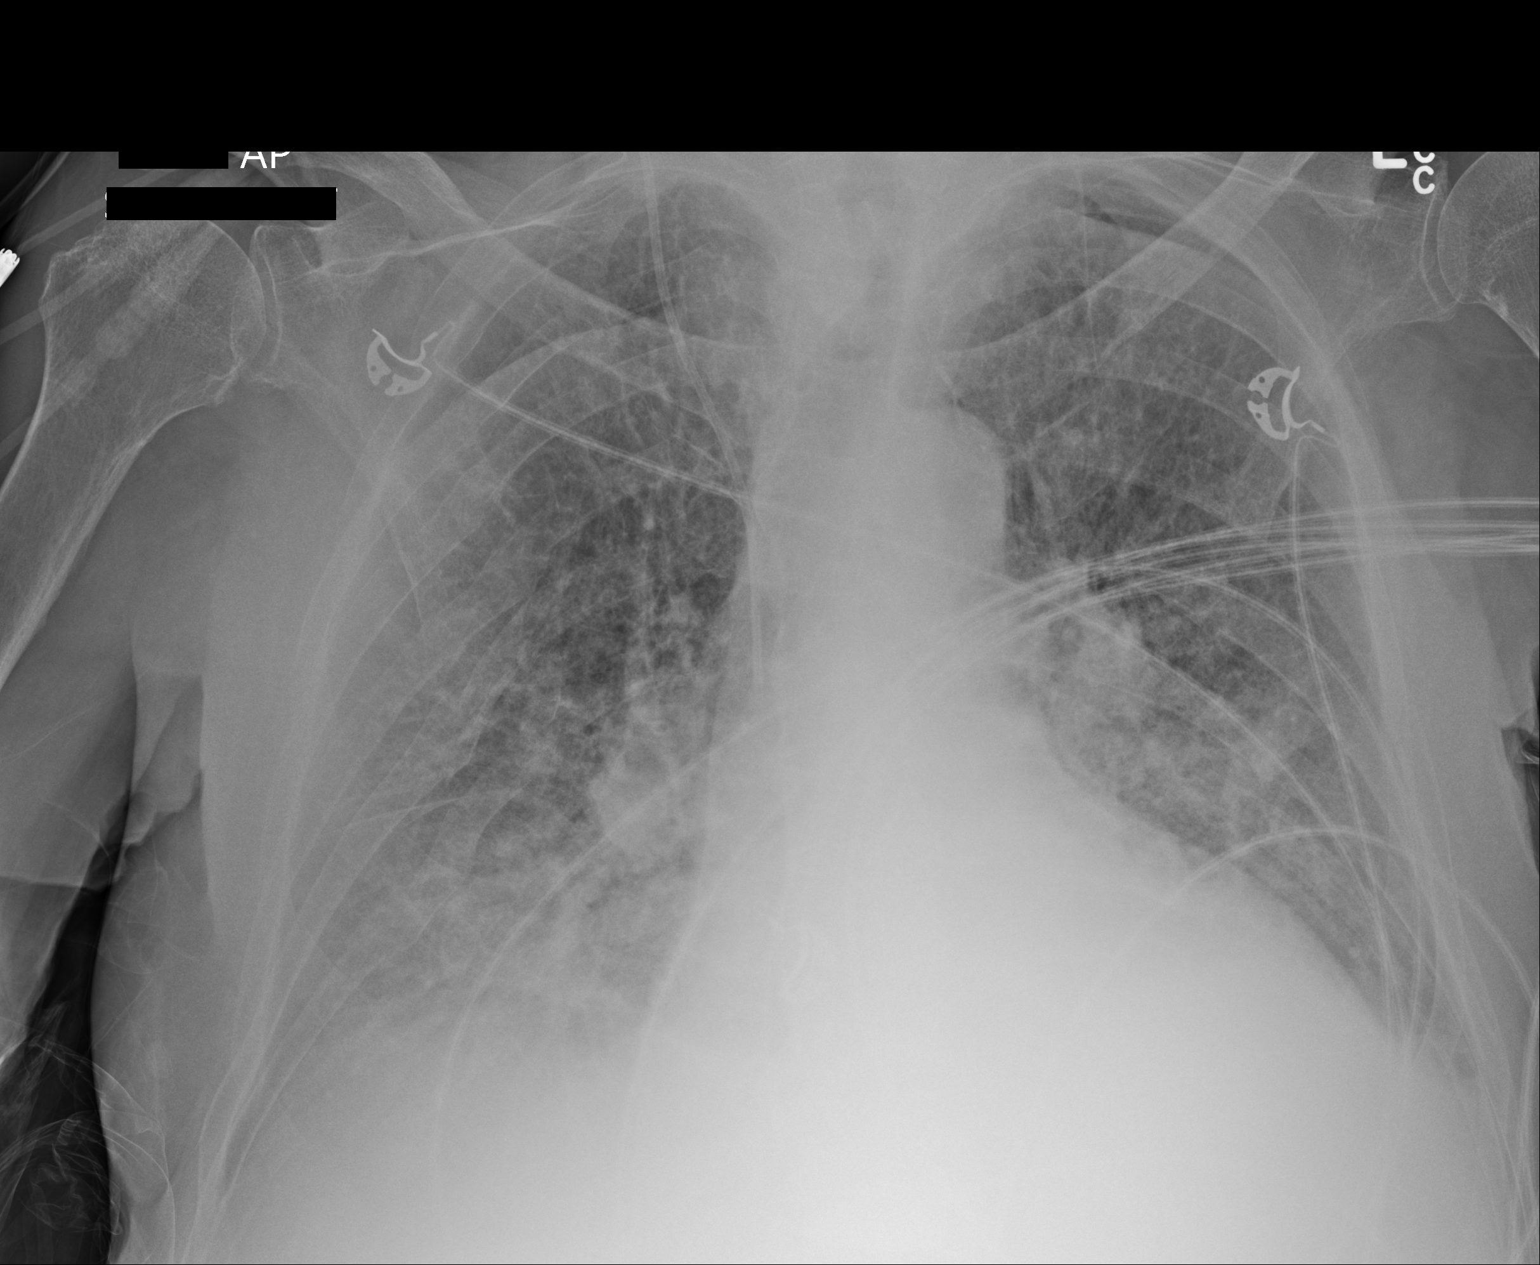

[1 of 1 positions shown; findings below may reference images not displayed]

FINDINGS: Interval extubation. NG tube removed. Right IJ central line remains
in the upper SVC. Cardiomegaly persist with slight worsening diffuse
interstitial edema pattern throughout both lungs. Basilar
atelectasis persist with dense left base consolidation/ collapse.
Small effusions not excluded. No pneumothorax. Healed posterior
right rib fractures present.
IMPRESSION: Cardiomegaly with slight worsening interstitial edema pattern.

## 2015-01-22 IMAGING — CR DG CHEST 1V PORT
1 series · 1 of 1 positions shown · non-contrast
Comparison: DG CHEST 1V PORT dated 12/28/2013

CLINICAL DATA: Shortness of breath.  ARDS.

EXAM:
PORTABLE CHEST - 1 VIEW

[AP]
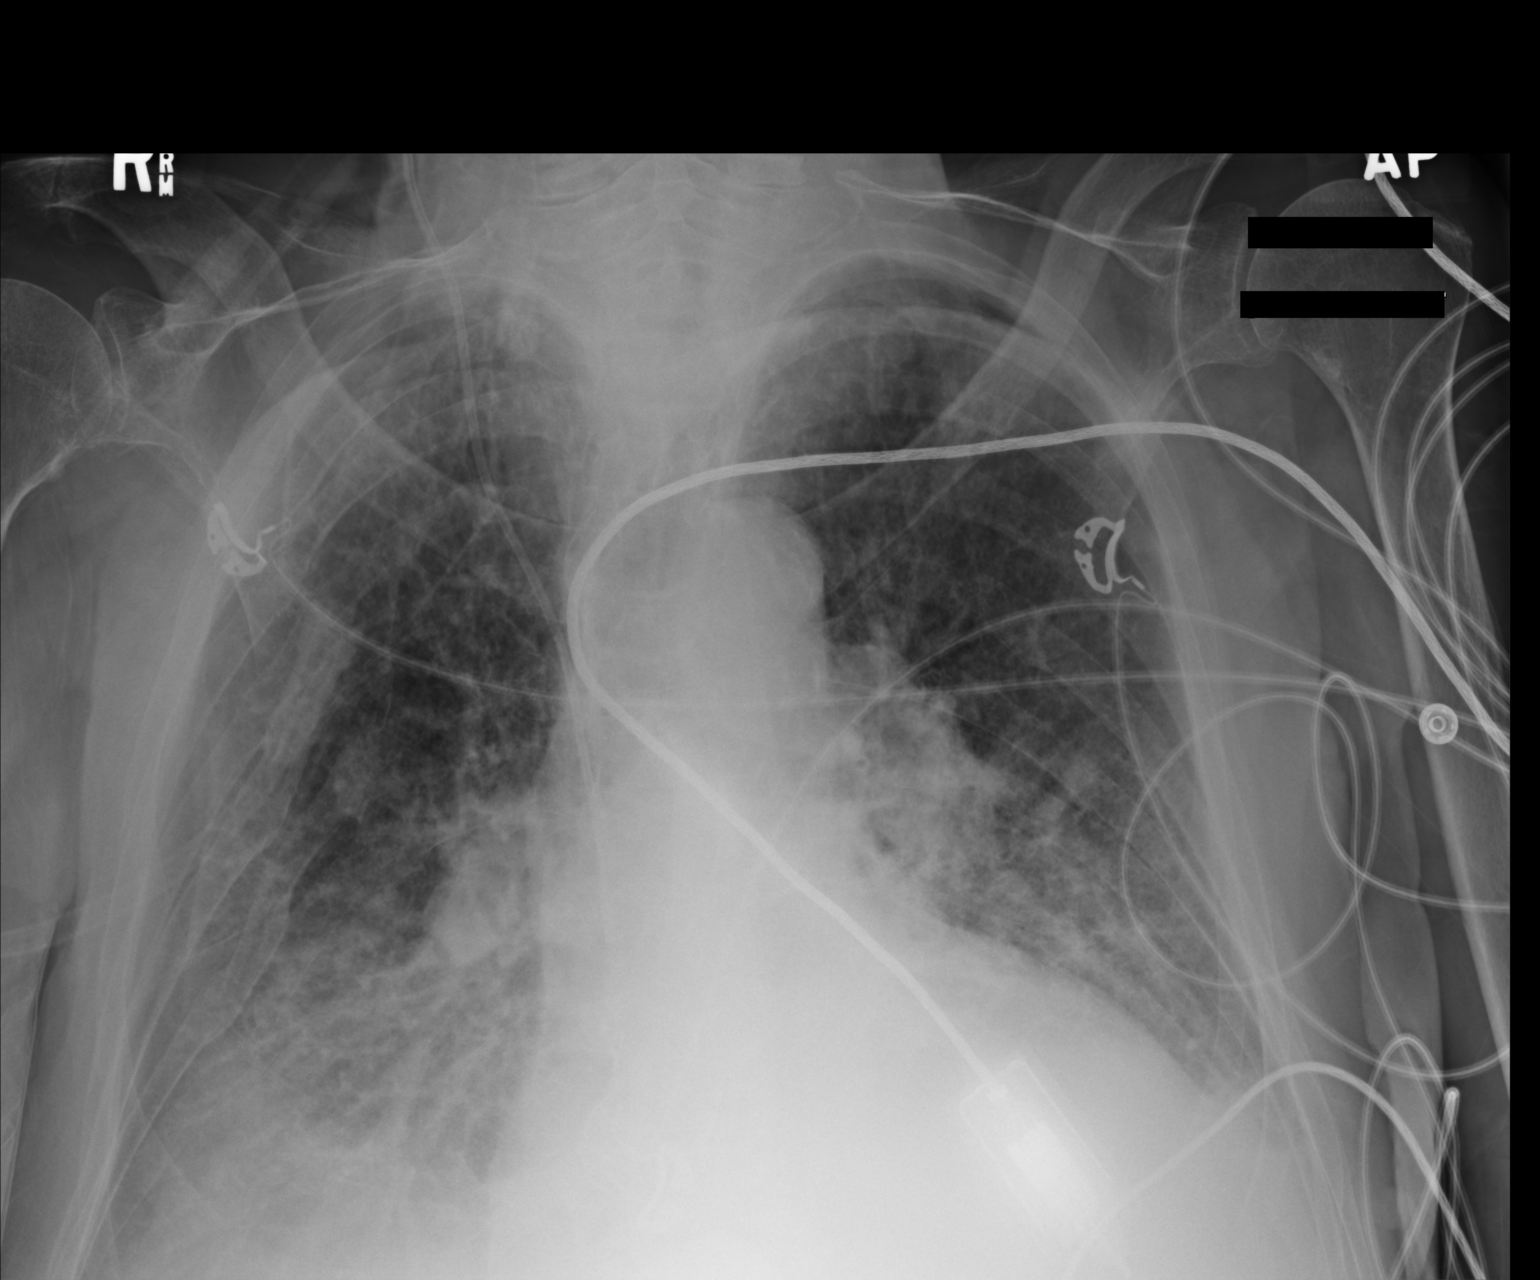

[1 of 1 positions shown; findings below may reference images not displayed]

FINDINGS: Right IJ line at the cavoatrial junction. Cardiomegaly with normal
pulmonary vascularity. Bilateral pulmonary alveolar infiltrates are
present and are unchanged. These infiltrates could be secondary to
pulmonary edema, ARDS, and or pneumonia. Small pleural effusions
cannot be excluded. The right costophrenic angle is not imaged. No
pneumothorax. Degenerative changes both shoulders.
IMPRESSION: 1. Right IJ line with tip at the cavoatrial junction.
2. Persistent bilateral pulmonary alveolar infiltrates. Pulmonary
edema, ARDS, and/or pneumonia could present in this fashion. There
has been no significant interval change from prior exam.
3. Cardiomegaly with normal pulmonary vascularity. Small pleural
effusions cannot be excluded.

## 2015-01-23 IMAGING — CR DG CHEST 1V PORT
1 series · 1 of 1 positions shown · non-contrast
Comparison: DG CHEST 1V PORT dated 12/29/2013; DG CHEST 1V PORT
dated 12/28/2013

CLINICAL DATA: Respiratory failure.

EXAM:
PORTABLE CHEST - 1 VIEW

[AP]
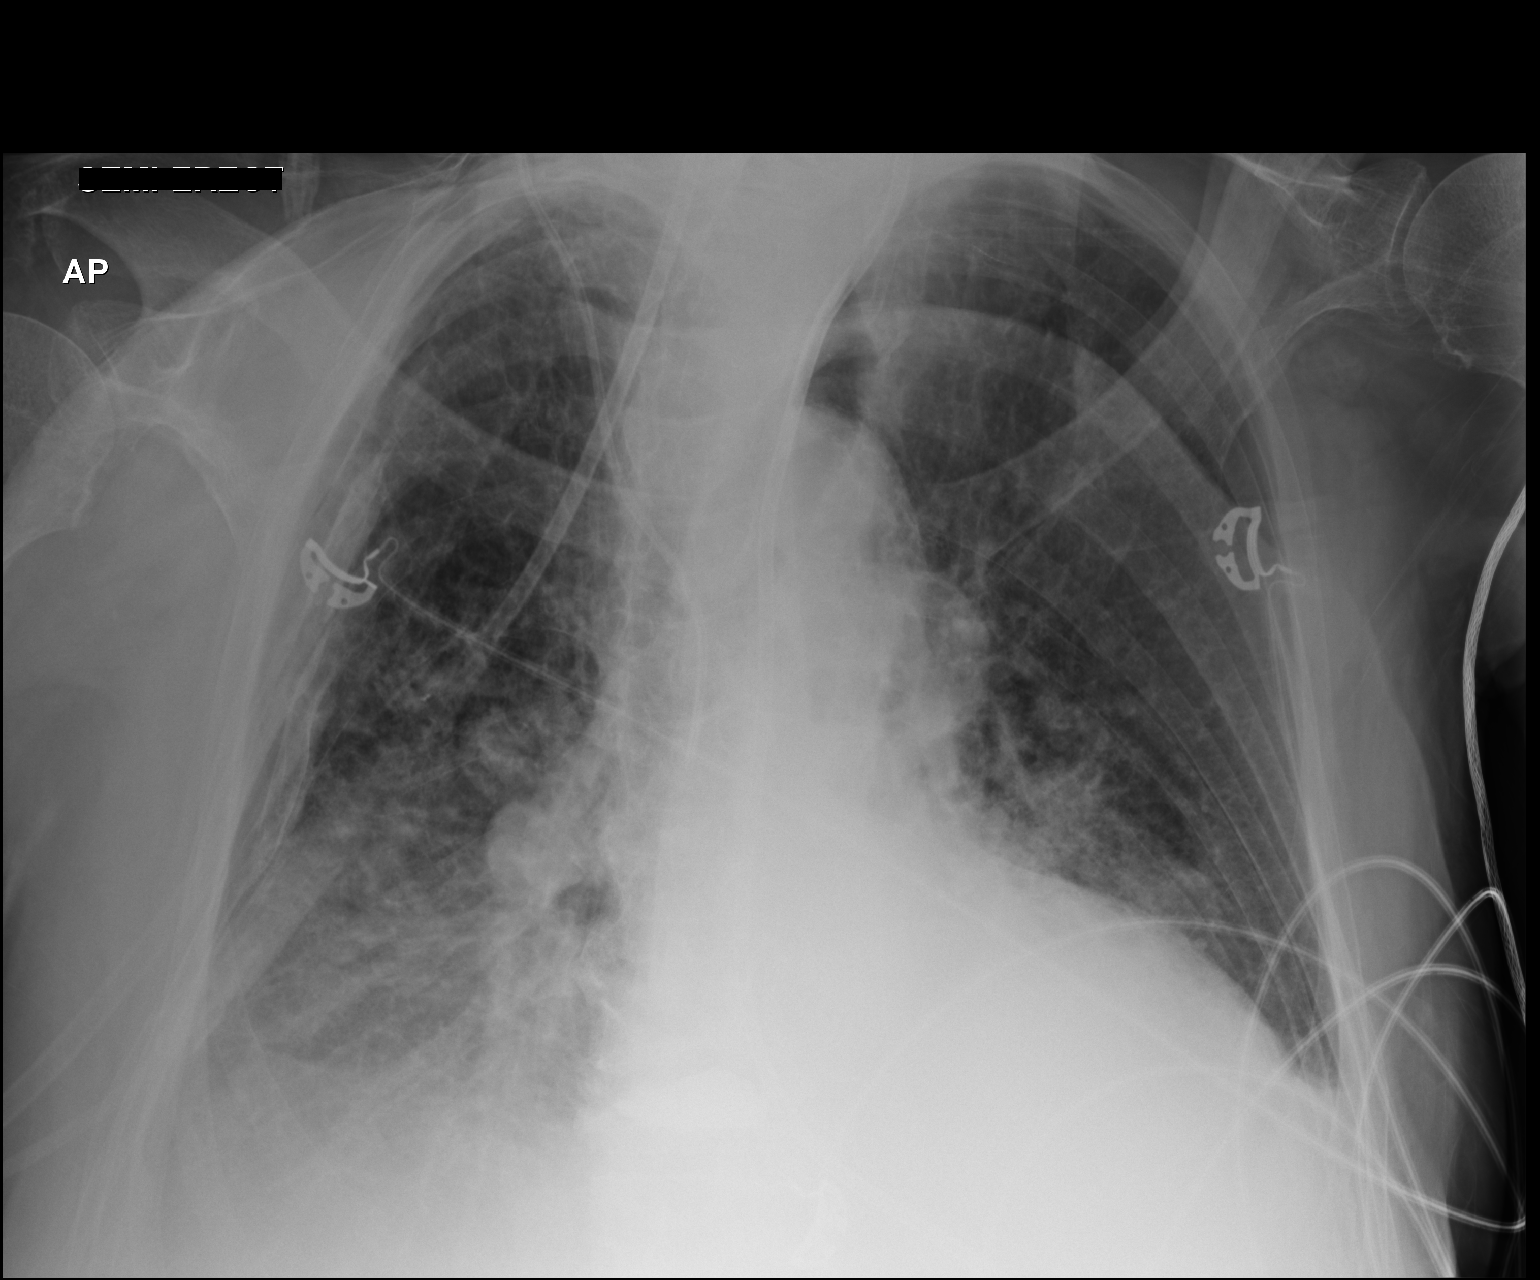

[1 of 1 positions shown; findings below may reference images not displayed]

FINDINGS: Right IJ line in stable position. Orogastric tube interim placement.
Tip below the left hemidiaphragm. Persistent cardiomegaly and
bilateral pulmonary alveolar infiltrates. Again congestive heart
failure [place] pulmonary edema, ARDS, and bilateral pneumonia could
present in this fashion. Small pleural effusions cannot be excluded.
No pneumothorax. No acute osseous abnormality.
IMPRESSION: 1. Interim placement of orogastric tube, its tip is below the left
hemidiaphragm. Right IJ line in stable position.
2. Cardiomegaly with bilateral pulmonary infiltrates unchanged.
Again congestive heart failure pulmonary edema, ARDS, bilateral
pneumonia could present in this fashion.
3. Small pleural effusions cannot be excluded.

## 2015-12-27 ENCOUNTER — Encounter: Payer: Self-pay | Admitting: Internal Medicine
